# Patient Record
Sex: Female | Born: 1965 | Race: Black or African American | Hispanic: No | Marital: Married | Smoking: Never smoker
Health system: Southern US, Community
[De-identification: ages and names within clinical notes are randomized; demographics above are authoritative.]

## PROBLEM LIST (undated history)

## (undated) DIAGNOSIS — E328 Other diseases of thymus: Secondary | ICD-10-CM

## (undated) DIAGNOSIS — M1990 Unspecified osteoarthritis, unspecified site: Secondary | ICD-10-CM

## (undated) DIAGNOSIS — R7989 Other specified abnormal findings of blood chemistry: Secondary | ICD-10-CM

## (undated) DIAGNOSIS — D573 Sickle-cell trait: Secondary | ICD-10-CM

## (undated) DIAGNOSIS — E1169 Type 2 diabetes mellitus with other specified complication: Secondary | ICD-10-CM

## (undated) DIAGNOSIS — E119 Type 2 diabetes mellitus without complications: Secondary | ICD-10-CM

## (undated) DIAGNOSIS — I10 Essential (primary) hypertension: Secondary | ICD-10-CM

## (undated) DIAGNOSIS — K219 Gastro-esophageal reflux disease without esophagitis: Secondary | ICD-10-CM

## (undated) HISTORY — DX: Other diseases of thymus: E32.8

## (undated) HISTORY — PX: COLONOSCOPY: SHX174

## (undated) HISTORY — DX: Gastro-esophageal reflux disease without esophagitis: K21.9

## (undated) HISTORY — DX: Hyperlipidemia, unspecified: E11.69

## (undated) HISTORY — DX: Essential (primary) hypertension: I10

## (undated) HISTORY — DX: Sickle-cell trait: D57.3

## (undated) HISTORY — DX: Other specified abnormal findings of blood chemistry: R79.89

## (undated) HISTORY — DX: Unspecified osteoarthritis, unspecified site: M19.90

## (undated) HISTORY — PX: CARDIAC CATHETERIZATION: SHX172

---

## 2006-08-23 DIAGNOSIS — M1990 Unspecified osteoarthritis, unspecified site: Secondary | ICD-10-CM

## 2006-08-23 HISTORY — DX: Unspecified osteoarthritis, unspecified site: M19.90

## 2007-05-21 ENCOUNTER — Inpatient Hospital Stay (HOSPITAL_COMMUNITY): Admission: EM | Admit: 2007-05-21 | Discharge: 2007-05-23 | Payer: Self-pay | Admitting: Emergency Medicine

## 2007-05-21 ENCOUNTER — Encounter (INDEPENDENT_AMBULATORY_CARE_PROVIDER_SITE_OTHER): Payer: Self-pay | Admitting: *Deleted

## 2007-05-23 ENCOUNTER — Ambulatory Visit: Payer: Self-pay | Admitting: Thoracic Surgery

## 2007-06-07 ENCOUNTER — Ambulatory Visit: Payer: Self-pay | Admitting: Thoracic Surgery

## 2007-06-09 ENCOUNTER — Ambulatory Visit (HOSPITAL_COMMUNITY): Admission: RE | Admit: 2007-06-09 | Discharge: 2007-06-09 | Payer: Self-pay | Admitting: Thoracic Surgery

## 2007-06-13 ENCOUNTER — Ambulatory Visit: Payer: Self-pay | Admitting: Thoracic Surgery

## 2007-12-06 ENCOUNTER — Ambulatory Visit: Payer: Self-pay | Admitting: Thoracic Surgery

## 2007-12-06 ENCOUNTER — Encounter: Admission: RE | Admit: 2007-12-06 | Discharge: 2007-12-06 | Payer: Self-pay | Admitting: Thoracic Surgery

## 2008-06-05 ENCOUNTER — Encounter: Admission: RE | Admit: 2008-06-05 | Discharge: 2008-06-05 | Payer: Self-pay | Admitting: Thoracic Surgery

## 2008-06-05 ENCOUNTER — Ambulatory Visit: Payer: Self-pay | Admitting: Thoracic Surgery

## 2009-04-23 ENCOUNTER — Ambulatory Visit: Payer: Self-pay | Admitting: Thoracic Surgery

## 2009-04-23 ENCOUNTER — Encounter: Admission: RE | Admit: 2009-04-23 | Discharge: 2009-04-23 | Payer: Self-pay | Admitting: Thoracic Surgery

## 2010-02-19 ENCOUNTER — Ambulatory Visit (HOSPITAL_BASED_OUTPATIENT_CLINIC_OR_DEPARTMENT_OTHER): Admission: RE | Admit: 2010-02-19 | Discharge: 2010-02-19 | Payer: Self-pay | Admitting: Internal Medicine

## 2010-02-19 ENCOUNTER — Ambulatory Visit: Payer: Self-pay | Admitting: Diagnostic Radiology

## 2010-04-13 ENCOUNTER — Ambulatory Visit: Payer: Self-pay | Admitting: Diagnostic Radiology

## 2010-04-13 ENCOUNTER — Ambulatory Visit (HOSPITAL_BASED_OUTPATIENT_CLINIC_OR_DEPARTMENT_OTHER): Admission: RE | Admit: 2010-04-13 | Discharge: 2010-04-13 | Payer: Self-pay | Admitting: Internal Medicine

## 2010-05-26 ENCOUNTER — Ambulatory Visit: Payer: Self-pay | Admitting: Thoracic Surgery

## 2010-05-26 ENCOUNTER — Encounter: Admission: RE | Admit: 2010-05-26 | Discharge: 2010-05-26 | Payer: Self-pay | Admitting: Thoracic Surgery

## 2011-01-05 NOTE — Discharge Summary (Signed)
[name], [name]            ACCOUNT NO.:  [id number]   MEDICAL RECORD NO.:  [id number]          PATIENT TYPE:  INP   LOCATION:  2038                         FACILITY:  [facility]   PHYSICIAN:  Dani Gobble, MD       DATE OF BIRTH:  1965/10/15   DATE OF ADMISSION:  05/21/2007  DATE OF DISCHARGE:  05/23/2007                               DISCHARGE SUMMARY   DISCHARGE DIAGNOSES:  1. Chest pain, possible secondary to gastrointestinal source.      a.     Negative myocardial infarction, though troponin was slightly       elevated.      b.     Patent coronary arteries.  2. Sickle cell trait.      a.     Negative D-dimer.      b.     Negative pulmonary emboli per computerized tomography scan.  3. Migraine headaches.  4. Murmur.  5. Hyperglycemia with a elevated glycohemoglobin.  6. 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 Karle Plumber.  7. Hypertension.  8. Left ventricular hypertrophy.  9. Hyperlipidemia.  10.History of migraine headaches.  11.Cough, secondary to ACE inhibitor.  12.Abnormal thyroid-stimulating hormone.   DISCHARGE CONDITION:  Improved.   PROCEDURES:  Combined left heart catheter May 22, 2007 by Dr.  Susa Griffins.   DISCHARGE MEDICATIONS:  1. Metoprolol 50 mg ER twice a day.  2. Prilosec 20 mg twice a day.  3. Simvastatin 20 mg every evening.  4. Enteric coated aspirin 81 mg daily.  5. Diovan 80 with HCTZ 25 mg daily.  6. Zomig as needed for headaches.  7. Estradiol daily __________ .  8. Allegra 180 mg as needed daily.   DISCHARGE INSTRUCTIONS:  1. May return to work May 29, 2007.  2. Low sodium, heart healthy low carb diet.  3. Increase activity slowly.  4. May shower, bathe.  5. No lifting for 2 days.  6. No driving for 2 days.  7. Wash catheter site with soap and water.  Call if any bleeding,      swelling or drainage.  8. Follow up with Dr. Kandis Cocking office.  Will call date  and time.  9. Follow up with Dr. Edwyna Shell.  His appointment should call you for      that appointment and the phone number was given.  10.Follow up with your primary care physician in [facility] for      possible diabetes.  11.Stop taking lisinopril HCTZ.  This may be the source of your cough.  12.Stop taking Prednisone.   HISTORY OF PRESENT ILLNESS:  A 45 year old black female with a past  medical history of hypertension, migraine headaches who has been having  intermittent chest pain which has been progressive, increasing in  frequency and severity as she had a 2 hour episode the day prior to  admission, and was not helped with Pepto Bismol.  It was described as  mid substernal chest pain.  No radiation, no diaphoresis.  No shortness  of breath, nausea, vomiting.  Recurred  on May 21, 2007 and was  worse, 8 out of 10 with shortness of breath and diaphoresis.  Improved  with aspirin by EMS and 2 sublingual nitroglycerin.  In the ER, she was  pain-free.  She also describes decreased activity tolerance.  She has  had some palpitations.  She had a heart catheter in 1997 in [place] that  was stable.   PAST MEDICAL HISTORY:  As stated above.  She also is positive for sickle  cell trait.   ALLERGIES:  No known allergies.   OUTPATIENT MEDICATIONS:  1. Allegra 180 mg daily.  2. She had 2 days of Prednisone.  3. Metoprolol 50 mg b.i.d.  4. Estradiol 20 mg for migraine p.r.n.  5. Lisinopril HCTZ 20/25 daily.   SOCIAL HISTORY:  Married, 3 children.  No tobacco, alcohol or illicit  drugs.  She works at housekeeping at [employer].   FAMILY HISTORY:  Diabetes and hypertension.   REVIEW OF SYSTEMS:  See H&P.   PHYSICAL EXAMINATION AT DISCHARGE:  VITAL SIGNS:  Blood pressure 115/74,  pulse 56, respiratory 18, temperature 97, oxygen saturation 98%.  GENERAL:  She had no complaints.  LUNGS:  Clear to auscultation bilaterally.  HEART:  Regular rate and rhythm with a 2/6 systolic ejection  murmur.   LABORATORY DATA:  On admission, hemoglobin 13.6, hematocrit 39.8, WBC  10.2, platelets 153,000, neutrophils were slightly up at 82, secondary  to Prednisone.  At discharge, hemoglobin 12.7, hematocrit 37.4, WBC 6.7,  platelets 125,000 and that was on a heparin infusion.   Chemistry on admission:  Sodium 136, potassium 3.1, chloride 102, CO2  25, BUN 14, creatinine 0.96, glucose 192.  Glucose had improved, range  120 and 108.  Potassium prior to discharge had been 3.3.  She was given  40 potassium for repletion and her creatinine was 1.13 with a BUN of 18.  On heparin infusion, she was therapeutic.  AST 22, ALT 22, alkaline phos  32, total bilirubin 0.7, albumin 3.7.   Cardiac enzymes:  CK was 149, MB 2.1 and 121 CK with 1.6 __________  negative.  Troponin 0.11, 0.8 and 0.17.  Calcium 8.9 to 8.3.  Magnesium  2.1.  D-dimer was less than 0.22.  TSH was 1.648, glycohemoglobin was  elevated at 6.6 and TSH was 0.30.   BNP on admission was 40.   Chest x-ray on admission:  No active cardiopulmonary disease.  Lungs  were clear.  CT angio to rule out PE was negative for PE but there was a  solid mass-like structure in anterior mediastinum located just anterior  to the ascending aorta and superior aspect to the main pulmonary artery  segment.  Oval in configuration, 2.0 x 3.0 x 2.7.  It is in the region  of the thymus gland.  Consideration for a thymoma lymphoma.  The  radiologist did not feel it was the appearance for residual thymic  tissue.  Thoracic surgeon consult was obtained.   Two-dimensional echo:  LV was in systolic left ventricular mid cavity  obliteration.  There was mild aortic valve regurgitation.  Right  ventricular systolic function was normal.  Possible tiny posterior  pericardial effusion.  The left ventricle was small overall.  Left  ventricular systolic function was hyperdynamic.  No regional wall  abnormalities.  Also in the echo, mild mitral valve  regurgitation.   On the cardiac catheterization, there was no evidence of obstructive  disease at the outflow tract at rest as possibly described on the  2-D echo.  She has systemic hypertension with normal renal arteries and  patent coronary arteries.  EF was 60% and no mitral regurgitation was  present as well.   HOSPITAL COURSE:  The patient was admitted by Dr. Domingo Sep for a 45-year-  old female with chest pain.  She had mildly abnormal troponins, though  once she was admitted, had no further chest pain.  She was put on IV  nitroglycerin and heparin.  Steroids were discontinued that she had been  on.  Dr. Domingo Sep felt her cough was related to her ACE inhibitor and  this was discontinued at discharge and she was switched to a __________  ARB.  The cough may linger for another month before it is totally  resolved if it is indeed, from ACE inhibitor.  The patient was kept in  the transitional care unit.  By the next morning, we realized as well  the patient stated she had sickle cell trait.  She has 2 children with  sickle cell disease.  D-dimer was done which was negative and she went  on to her cardiac catheter which showed normal __________ .  Because of  the normal __________ , the shortness of breath as well as sickle cell  trait, we did a CT of her chest which was negative for pulmonary emboli  and this was done May 23, 2007.   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  Dr. Karle Plumber was consulted.  He felt following the asymptomatic thymus tumor,  if that is indeed what it was, was prudent.  He seemed to not feel it  was the cause of her chest pain.  It could be a benign thymoma, thymic  or hyperplasia lymph node enlargement or a teratoma.  He felt that she  was stable to go home.  He will see her back as an outpatient where he  may get a PET scan as well.  Also during the hospitalization, her  glycohemoglobin was elevated at 6.6.  Dietician should  speak to her  prior to her discharge and she will be on a low carb diet until she can  follow up with Dr. Tresa Endo at [facility].  She also will need followup on  her possible hypothyroidism.  We actually ordered a hypothyroid panel  but they just did another TSH instead so we did not want to order it  after her cardiac dye; she needed to wait a week or so, so she will  follow up with her primary care physician concerning that.  We will add  a PPI to see if that is the cause of her pain, twice a  day.  Her lipids were up so we are adding simvastatin 20 mg and we  changed her lisinopril HCT to Diovan HCT.  She will also follow up with  Dr. Alanda Amass just to ensure she is stable and following up with Dr.  Edwyna Shell.  Dr. Clarene Duke saw her on May 23, 2007.  The patient  understands about her mass and her medications.           ______________________________  Dani Gobble, MD     AB/MEDQ  D:  05/23/2007  T:  05/24/2007  Job:  [id number]   cc:   Dani Gobble, MD  Richard A. Alanda Amass, M.D.  Ines Bloomer, M.D.  Almedia Balls

## 2011-01-05 NOTE — Letter (Signed)
June 13, 2007   Richard A. Alanda Amass, M.D.  [phone] [address]  [place], [place] [address]   Re:  [name], [name]              DOB:  [birth year]   Dear Luan Pulling:   I saw the patient back for followup today.  As you know, she was  admitted for chest pain, and she was found to have a 2-cm thymic mass.  We got a PET scan on this today, and it showed no increased activity in  the mass.  This is probably a benign lymph node or a very slow-growing  thymoma.  Whatever the case is, we can follow this safely, so I have  schedule her to see me again in 3 months with a CT scan.  I informed her  and her husband of this and they were happy of the findings.  Her blood  pressure was 137/90, pulse 62, respirations 18, sats were 98%.   Ines Bloomer, M.D.  Electronically Signed   DPB/MEDQ  D:  06/13/2007  T:  06/14/2007  Job:  [id number]   cc:   Almedia Balls

## 2011-01-05 NOTE — Cardiovascular Report (Signed)
NAMEBRILYNN, [name]            ACCOUNT NO.:  [id number]   MEDICAL RECORD NO.:  [id number]          PATIENT TYPE:  INP   LOCATION:  2921                         FACILITY:  [facility]   PHYSICIAN:  Richard A. Alanda Amass, M.D.DATE OF BIRTH:  1966/04/22   DATE OF PROCEDURE:  DATE OF DISCHARGE:                            CARDIAC CATHETERIZATION   PROCEDURE:  Retrograde central aortic catheterization, selective  coronary angiography by Judkins technique, LV angiogram in RAO/LAO  projection, induced PVCs with simultaneous LV FA pressure measurements,  abdominal aortic angiogram mid stream PA  projection, right common  femoral artery closure with StarClose device successful.   PROCEDURE:  The patient was brought to second floor CP lab in  postabsorptive state after 5 mg of Valium p.o. premedication.  Heparin  was on hold.  She was premedicated with 5 mg of Valium and given 2 mg of  Versed IV for sedation in the lab; 1% Xylocaine was used for local  anesthesia,  and the RCFA was entered with a single anterior puncture  using 18 thin-wall needle.  A 6-French short side arm sheath was  inserted without difficulty.  Diagnostic coronary angiography was done  with 6-French 4 cm tapered Cordis  preformed coronary catheters and a 5-  French pigtail catheter for LV angiogram in the RAO and LAO projection  at 25 ml/14 ml per second, 20 ml/12 ml per second.  Pullback pressures  of the CA showed no gradient across the aortic valve.   There was no post PVC gradient and no Brockenbrough sign on induced PVCs  with simultaneous LV NFA pressure measurement.  Pigtail catheter was  pulled down above the level of the renal arteries and abdominal aortic  angiogram was done because of the patient's hypertension to rule out  renal disease, particularly FMD at this age.  This was done at 25 ml/20  ml per second and demonstrated dual left renal arteries that were widely  patent and single right renal artery that was  widely patent.  There was  no evidence of FMD or renal artery stenosis.  Catheter was removed;  right femoral angiogram was done by hand injection in light projection  showing good puncture into the RCFA.  The arteriotomy was closed because  of the patient's obesity and cough with a 6-French StarClose device  successfully.  She was transferred to the holding area for postoperative  care in stable condition and she tolerated the procedure well.   PRESSURES:  LV:  160/0; LVEDP 18-20 mmHg.   CA:  160/90 mmHg.  There was no gradient across the aortic valve on  catheter pullback and no post PVC gradient or Brockenbrough  sign as  outlined above.   Fluoroscopy did not reveal any coronary, intracardiac or valvular  calcification.   LV angiogram in the RAO and LAO projection showed a normally contracting  left ventricle with no segment wall motion abnormality; EF approximately  60%; there was no mitral regurgitation present.  There appeared to be  concentric LVH on LV angiogram.   The main left coronary artery was large and normal.   The LAD was widely patent  and somewhat tortuous, but smooth and normal  throughout its course and coursed through the undersurface of the heart  around the apex.  There was a large DX-1 that was normal before SP-1 and  a large DX-2 with the junction of the proximal third of the LAD that was  normal.   The circumflex was moderate to large with two large marginal branches in  the distal AV groove branch.  It was widely patent and normal and  nondominant.   The right coronary artery was a widely patent, normal and dominant  vessel with normal PDA and PLA.   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  She is G:4 P:3 AB:1 (one  miscarriage) and she and her husband have sickle cell trait.  She has  two children with sickle cell disease.  She has recent urinary tract  infection-type illness  apparently treated by Dr. Tresa Endo in [facility]  with decongestant and a course of steroids.  There is a history of  hypertension, migraine headaches and intermittent chest pain.  She has  had no recent accidents and no history of deep vein thrombosis or leg  discomfort.  She was admitted to the hospital by Dr. Domingo Sep with  episodic intermittent chest pain that had been progressive over several  days to a week.  This was nonpleuritic and substernal.  She had no  change with antacids, but she did have a two-hour episode the night  prior to admission.  CPK-MBs were negative, troponin was mildly  elevated, LDL was elevated to 140 with cholesterol of 209.  She has  exogenous obesity and apparent known history of hypertension.  There  were no diagnostic EKG changes  and patient was started on heparin, beta-  blocker and aspirin and statin therapy pending diagnostic  catheterization.   At this dictation, D-dimer is pending.  Diagnostic cath shows normal  coronary arteries and left ventricle.  Of note, 2-D echo done by Dr.  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  There was apparently a left  ventricular outflow track obstruction noted, but no diagnostic speckled  pattern, and I am not sure of ventricular or septal dimensions at this  dictation.  There was no gradient at rest and negative Brockenbrough  sign in the cath lab.  This patient may have an component of  hypertrophic obstructive cardiomyopathy which was not evident at  catheterization; we did not try to precipitate this with Isuprel  infusion at this study.   Concerned about elevated troponin with her chest pain history and recent  cough and we plan to do a chest CT to rule out pulmonary embolus.  We  will resume heparin until this is done in the morning pending followup  renal function.  We will start her on antibiotics because of her cough  with presumed bronchitis.   Recommend treatment of her hypertension and  she may need followup 2-D echo.  Treatment might well include beta  blockers and/or calcium blockers such as verapamil in this setting.   CATHETERIZATION DIAGNOSIS:  1. Chest pain, etiology not determined.  2. Possible hypertrophic cardiomyopathy on recent 2-D echo; no      evidence of obstructive disease at outflow track at rest on this      study as outlined above.  3. Systemic hypertension; normal renal arteries.  4. Hyperlipidemia.  5. Exogenous obesity.  6. Sickle cell  trait (two children with sickle cell disease).  7. Recent urinary tract infection and cough.  Pulmonary embolus being      ruled out with known SS trait.      Richard A. Alanda Amass, M.D.  Electronically Signed     RAW/MEDQ  D:  05/22/2007  T:  05/22/2007  Job:  [id number]   cc:   CP Lab  Dani Gobble, MD  Almedia Balls

## 2011-01-05 NOTE — Assessment & Plan Note (Signed)
OFFICE VISIT   VALENTINA, ALCOSER  DOB:  1966/04/03                                        June 07, 2007  CHART #:  [id number]   Ms. Kaylei Frink came today for followup of her thymic mass.  Her  blood pressure was 126/86.  Pulse 60.  Respirations 18.  Sats were 100.  Chest x-ray was stable.  She has had no more chest pain.  Because of the  thymic mass, I reviewed it again and I decided to go ahead and order her  a PET scan.  I will see her back again after the PET scan.  I am worried  that she may have a thymoma or lymphoma.   Ines Bloomer, M.D.  Electronically Signed   DPB/MEDQ  D:  06/07/2007  T:  06/08/2007  Job:  [id number]

## 2011-01-05 NOTE — Letter (Signed)
May 26, 2010   Richard A. Alanda Amass, MD  [address]  [place], [place] [address]   Re:  ENZLEY, KITCHENS              DOB:  08/24/1965   Dear Luan Pulling:   I saw the patient back today for followup of her anterior mediastinal  mass.  Her blood pressure was 137/94, pulse 54, respirations 16, and  sats were 98%.  Overall, she is doing well and I will see her back again  in 1 year with another CT scan.  The CT scan today showed that was  unchanged at 3 x 2 cm.   Ines Bloomer, M.D.  Electronically Signed   DPB/MEDQ  D:  05/26/2010  T:  05/26/2010  Job:  [id number]

## 2011-01-05 NOTE — Letter (Signed)
December 06, 2007   Susa Griffins, MD  [phone] [address]  [place], [place] [address]   Re:  MILEYDI, MILSAP              DOB:  01-03-1966   Dear Luan Pulling:   I saw Ms. Knudtson for follow up today.  She is doing well.  We repeated  her CT scan of her anterior mediastinum, and it was okay at 3.7 x 1.8.  If there is no other change, we will just continue to follow this and I  will see her back again in six months with the CT scan.   Blood pressure is 160/90, pulse of 58, respirations 18, sats are 98%.   Ines Bloomer, M.D.  Electronically Signed   DPB/MEDQ  D:  12/06/2007  T:  12/06/2007  Job:  [id number]

## 2011-01-05 NOTE — Consult Note (Signed)
[name], [name]            ACCOUNT NO.:  [id number]   MEDICAL RECORD NO.:  [id number]          PATIENT TYPE:  INP   LOCATION:  2038                         FACILITY:  [facility]   PHYSICIAN:  [name], M.D. DATE OF BIRTH:  [birth year]   DATE OF CONSULTATION:  DATE OF DISCHARGE:                                 CONSULTATION   HISTORY OF PRESENT ILLNESS:  This 45 year old African-American female  has a past medical history of hypertension, migraine headaches, and  intermittent chest pain.  She had a catheterization that was okay in  1997.  She was admitted with increasing intermittent chest pain that was  unrelieved with Pepto-Bismol, with diaphoresis, no shortness of breath.  It apparently got better with aspirin and 2 sublingual nitroglycerins.  She was admitted on May 21, 2007 and underwent a workup.  Her  enzymes were negative.  She was underwent a catheterization, which was  normal.  A CT scan was done for pulmonary embolus which was negative,  but revealed a 2 x 3 cm lesion in the thymus gland that was read by the  radiologist as being a possible thymoma.  It also could be a hypoplasia,  lymphadenopathy, I doubt a teratoma.  She has had no weight loss.  She  has had some blurred visions, but no other symptoms of myasthenia  gravis.   PAST MEDICAL HISTORY:  She has no allergies.   MEDICATIONS:  1. Phenazodine 180 mg a day.  2. Prednisone.  She had been on prednisone 40 mg daily for 2 days.  3. Metoprolol 50 mg at night.  4. Estradiol 20 mg p.r.n. for headaches.  5. Lisinopril and hydrochlorothiazide 20/25 mg daily.   FAMILY HISTORY:  Noncontributory.  Negative for diabetes and  hypertension.   SOCIAL HISTORY:  She is married.  She has 3 children.  She does not  smoke, occasional alcohol.  She works in housekeeping.   REVIEW OF SYSTEMS:  CARDIAC:  See history of present illness.  PULMONARY:  She had a dry cough.  No hemoptysis, fever or chills.  GI:  No  nausea, vomiting, constipation or diarrhea.  GU:  No dysuria,  frequent urination or kidney disease.  VASCULAR:  No claudication, DVT  or TIAs.  NEUROLOGICAL:  She does have headaches.  See history of  present illness.  No seizures or blackouts.  MUSCULOSKELETAL:  No joint  pain.  The chest pain could be muscular in nature.  No skin lesions.  HEMATOLOGICAL:  No problems with bleeding or clotting disorders.   PHYSICAL EXAMINATION:  GENERAL:  She is a well-developed, slightly obese  African-American female in no acute distress.  VITAL SIGNS:  Blood pressure 120/85, pulse 60, saturations were 98%.  She is afebrile.  HEENT:  Unremarkable.  NECK:  Supple without thyromegaly.  There is no supraclavicular or axial  adenopathy.  CHEST:  Clear to auscultation and percussion.  HEART:  Regular sinus rhythm.  I/VI murmur left sternal border.  ABDOMEN:  Soft.  There is no hepatosplenomegaly.  EXTREMITIES:  Pulses are 1+.  No clubbing or edema.  NEUROLOGICAL:  She is oriented  x3.  Sensory and motor are intact.   IMPRESSION:  1. 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  Rule out thymic hyperplasia, rule      out lymphadenopathy. Possible lymphoma.  I doubt teratoma.  2. History of chest pain.  3. Hypertension.  4. History of migraine headaches.   FOLLOW UP:  Follow up in office.  I recommend biopsy with surgery at the  present time.  May consider PET scan as an outpatient.  I have discussed  situation with patient and family.      [name], M.D.  Electronically Signed     DPB/MEDQ  D:  05/23/2007  T:  05/23/2007  Job:  [id number]

## 2011-01-05 NOTE — Letter (Signed)
June 05, 2008   Richard A. Alanda Amass, MD  [address]  [place], [place] [address]   Re:  JERILEE, SPACE              DOB:  04/26/1966   Dear Gerlene Burdock:   I saw the patient back today for followup of her anterior mediastinal  mass.  Really, there has been no change since the CT scan 6 months ago.  It was 3.2 x 2 cm.  They measured 3.2 x 1.8 cm and there is no change in  size since this was negative on her PET.  I think we can continue to  just follow this with intermittent CTs.  I am now going to see her again  in 9 months with her next CT scan.  Her blood pressure was 134/89, pulse  55, respirations 18, and sats were 96%.  Lungs were clear to  auscultation and percussion.   Ines Bloomer, M.D.  Electronically Signed   DPB/MEDQ  D:  06/05/2008  T:  06/05/2008  Job:  [id number]

## 2011-01-05 NOTE — Letter (Signed)
April 23, 2009   Richard A. Alanda Amass, MD  [address]  [place], [place] [address]   Re:  [name], [name]              DOB:  1966/03/07   Dear Luan Pulling,   I saw the patient back in the office today for a followup of her  anterior mediastinal mass, this is a 60-month CT, and it shows no  evidence of any change in the mass.  It has not shrunk or gotten any  larger.  Since it is of fairly good size being 3 x 2 cm, I will continue  to follow this and will stretch the CT scan out to a year.  We will see  her back again in a year to determine if any further therapy needs to be  done.   I  appreciate the opportunity of seeing the patient.   Sincerely,   Ines Bloomer, M.D.  Electronically Signed   DPB/MEDQ  D:  04/23/2009  T:  04/24/2009  Job:  [id number]

## 2011-05-14 ENCOUNTER — Other Ambulatory Visit: Payer: Self-pay | Admitting: Thoracic Surgery

## 2011-05-14 DIAGNOSIS — R222 Localized swelling, mass and lump, trunk: Secondary | ICD-10-CM

## 2011-06-03 LAB — MAGNESIUM: Magnesium: 2.1

## 2011-06-03 LAB — DIFFERENTIAL
Basophils Absolute: 0
Basophils Relative: 0
Eosinophils Absolute: 0
Eosinophils Relative: 0
Lymphocytes Relative: 14
Lymphs Abs: 1.4
Monocytes Absolute: 0.5
Monocytes Relative: 5
Neutro Abs: 8.3 — ABNORMAL HIGH
Neutrophils Relative %: 82 — ABNORMAL HIGH

## 2011-06-03 LAB — HEPARIN LEVEL (UNFRACTIONATED)
Heparin Unfractionated: 0.23 — ABNORMAL LOW
Heparin Unfractionated: 0.5
Heparin Unfractionated: 0.82 — ABNORMAL HIGH
Heparin Unfractionated: 1.09 — ABNORMAL HIGH

## 2011-06-03 LAB — CK TOTAL AND CKMB (NOT AT ARMC)
CK, MB: 3
CK, MB: 3.5
Relative Index: 1.9
Relative Index: 2.3
Total CK: 155
Total CK: 156

## 2011-06-03 LAB — CBC
HCT: 39.8
Hemoglobin: 13.3
Hemoglobin: 13.6
MCHC: 34.3
MCV: 89.6
Platelets: 125 — ABNORMAL LOW
Platelets: 153
RBC: 4.44
RDW: 12.5
RDW: 12.6
RDW: 13.1
WBC: 10.2
WBC: 6.7

## 2011-06-03 LAB — COMPREHENSIVE METABOLIC PANEL WITH GFR
ALT: 22
CO2: 26
Calcium: 8.9
Creatinine, Ser: 0.91
GFR calc non Af Amer: >60
Glucose, Bld: 120 — ABNORMAL HIGH
Sodium: 141
Total Protein: 7.3

## 2011-06-03 LAB — APTT: aPTT: 25

## 2011-06-03 LAB — COMPREHENSIVE METABOLIC PANEL
AST: 22
Albumin: 3.4 — ABNORMAL LOW
Alkaline Phosphatase: 32 — ABNORMAL LOW
BUN: 12
Chloride: 108
GFR calc Af Amer: >60
Potassium: 3.5
Total Bilirubin: 0.7

## 2011-06-03 LAB — BASIC METABOLIC PANEL WITH GFR
BUN: 14
CO2: 25
Calcium: 8.9
Creatinine, Ser: 0.96
GFR calc non Af Amer: >60
Glucose, Bld: 192 — ABNORMAL HIGH

## 2011-06-03 LAB — TSH
TSH: 0.3 — ABNORMAL LOW
TSH: 1.648

## 2011-06-03 LAB — BASIC METABOLIC PANEL
BUN: 18
Calcium: 8.3 — ABNORMAL LOW
Chloride: 102
Creatinine, Ser: 1.13
GFR calc Af Amer: >60
GFR calc non Af Amer: 53 — ABNORMAL LOW
Potassium: 3.1 — ABNORMAL LOW
Potassium: 3.3 — ABNORMAL LOW
Sodium: 136

## 2011-06-03 LAB — POCT CARDIAC MARKERS
CKMB, poc: 2.3
Myoglobin, poc: 76.5
Operator id: 151321
Troponin i, poc: 0.06 — ABNORMAL HIGH

## 2011-06-03 LAB — D-DIMER, QUANTITATIVE: D-Dimer, Quant: <0.22

## 2011-06-03 LAB — HEMOGLOBIN A1C
Hgb A1c MFr Bld: 6.6 — ABNORMAL HIGH
Mean Plasma Glucose: 158

## 2011-06-03 LAB — LIPID PANEL
Cholesterol: 218 — ABNORMAL HIGH
HDL: 47
Total CHOL/HDL Ratio: 4.6

## 2011-06-03 LAB — CARDIAC PANEL(CRET KIN+CKTOT+MB+TROPI)
CK, MB: 1.6
Relative Index: 1.3
Troponin I: 0.08 — ABNORMAL HIGH
Troponin I: 0.11 — ABNORMAL HIGH

## 2011-06-03 LAB — TROPONIN I
Troponin I: 0.1 — ABNORMAL HIGH
Troponin I: 0.13 — ABNORMAL HIGH
Troponin I: 0.17 — ABNORMAL HIGH

## 2011-06-03 LAB — PROTIME-INR
INR: 1.1
Prothrombin Time: 13.9

## 2011-06-03 LAB — B-NATRIURETIC PEPTIDE (CONVERTED LAB): Pro B Natriuretic peptide (BNP): 40

## 2011-06-14 ENCOUNTER — Encounter: Payer: Self-pay | Admitting: Thoracic Surgery

## 2011-06-14 DIAGNOSIS — I10 Essential (primary) hypertension: Secondary | ICD-10-CM | POA: Insufficient documentation

## 2011-06-14 DIAGNOSIS — G43909 Migraine, unspecified, not intractable, without status migrainosus: Secondary | ICD-10-CM | POA: Insufficient documentation

## 2011-06-16 ENCOUNTER — Ambulatory Visit
Admission: RE | Admit: 2011-06-16 | Discharge: 2011-06-16 | Disposition: A | Discharge status: Home / Self Care | Payer: Federal, State, Local not specified - PPO | Source: Ambulatory Visit | Attending: Thoracic Surgery | Admitting: Thoracic Surgery

## 2011-06-16 ENCOUNTER — Encounter: Payer: Self-pay | Admitting: Thoracic Surgery

## 2011-06-16 ENCOUNTER — Ambulatory Visit (INDEPENDENT_AMBULATORY_CARE_PROVIDER_SITE_OTHER): Payer: Federal, State, Local not specified - PPO | Admitting: Thoracic Surgery

## 2011-06-16 VITALS — BP 130/72 | HR 70 | Resp 16 | Ht 62.0 in | Wt 182.0 lb

## 2011-06-16 DIAGNOSIS — J984 Other disorders of lung: Secondary | ICD-10-CM

## 2011-06-16 DIAGNOSIS — R911 Solitary pulmonary nodule: Secondary | ICD-10-CM

## 2011-06-16 DIAGNOSIS — R222 Localized swelling, mass and lump, trunk: Secondary | ICD-10-CM

## 2011-06-16 NOTE — Progress Notes (Signed)
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 This is unchanged since 2008. I feel he needs to be follow these one more year. See her back again with a CT scan in one year. She was informed that my partners will see her at that time. If there is any change since she will probably will require a thymectomy.  Current Outpatient Prescriptions  Medication Sig Dispense Refill  . aspirin 81 MG tablet Take 81 mg by mouth daily.        . Calcium Carbonate-Vitamin D (CALCIUM-VITAMIN D) 500-200 MG-UNIT per tablet Take 1 tablet by mouth 2 (two) times daily with a meal.        . diclofenac (CATAFLAM) 50 MG tablet Take 50 mg by mouth 3 (three) times daily.        . metoprolol (TOPROL-XL) 50 MG 24 hr tablet Take 50 mg by mouth daily.        Marland Kitchen omega-3 acid ethyl esters (LOVAZA) 1 G capsule Take 2 g by mouth 2 (two) times daily.        Marland Kitchen omeprazole (PRILOSEC) 20 MG capsule Take 20 mg by mouth daily.        Marland Kitchen etodolac (LODINE) 500 MG tablet Take 500 mg by mouth 2 (two) times daily.        Marland Kitchen HYDROcodone-acetaminophen (NORCO) 10-325 MG per tablet Take 1 tablet by mouth every 6 (six) hours as needed.        . SUMAtriptan (IMITREX) 50 MG tablet Take 50 mg by mouth every 2 (two) hours as needed.           Review of Systems: Unchanged   Physical Exam  Cardiovascular: Normal rate and regular rhythm.   Pulmonary/Chest: Breath sounds normal. No respiratory distress.     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   Impression: Thymic mass  Plan: Return in one year with a CT scan

## 2011-06-17 ENCOUNTER — Other Ambulatory Visit (HOSPITAL_COMMUNITY)
Admission: RE | Admit: 2011-06-17 | Discharge: 2011-06-17 | Disposition: A | Discharge status: Home / Self Care | Payer: Federal, State, Local not specified - PPO | Source: Ambulatory Visit | Attending: Obstetrics and Gynecology | Admitting: Obstetrics and Gynecology

## 2011-06-17 ENCOUNTER — Other Ambulatory Visit: Payer: Self-pay | Admitting: Obstetrics and Gynecology

## 2011-06-17 DIAGNOSIS — Z124 Encounter for screening for malignant neoplasm of cervix: Secondary | ICD-10-CM | POA: Insufficient documentation

## 2011-06-17 DIAGNOSIS — Z1159 Encounter for screening for other viral diseases: Secondary | ICD-10-CM | POA: Insufficient documentation

## 2011-06-22 ENCOUNTER — Other Ambulatory Visit: Payer: Self-pay

## 2011-06-22 ENCOUNTER — Ambulatory Visit: Payer: Self-pay | Admitting: Thoracic Surgery

## 2012-03-03 ENCOUNTER — Ambulatory Visit (INDEPENDENT_AMBULATORY_CARE_PROVIDER_SITE_OTHER): Payer: Federal, State, Local not specified - PPO | Admitting: Family Medicine

## 2012-03-03 ENCOUNTER — Encounter: Payer: Self-pay | Admitting: Family Medicine

## 2012-03-03 VITALS — BP 123/78 | HR 54 | Temp 97.9°F | Resp 16 | Ht 62.0 in | Wt 180.0 lb

## 2012-03-03 DIAGNOSIS — E669 Obesity, unspecified: Secondary | ICD-10-CM | POA: Insufficient documentation

## 2012-03-03 DIAGNOSIS — M5136 Other intervertebral disc degeneration, lumbar region: Secondary | ICD-10-CM | POA: Insufficient documentation

## 2012-03-03 DIAGNOSIS — E66811 Obesity, class 1: Secondary | ICD-10-CM

## 2012-03-03 DIAGNOSIS — M5137 Other intervertebral disc degeneration, lumbosacral region: Secondary | ICD-10-CM

## 2012-03-03 DIAGNOSIS — I10 Essential (primary) hypertension: Secondary | ICD-10-CM

## 2012-03-03 DIAGNOSIS — M51369 Other intervertebral disc degeneration, lumbar region without mention of lumbar back pain or lower extremity pain: Secondary | ICD-10-CM

## 2012-03-03 MED ORDER — CELECOXIB 200 MG PO CAPS: 200.0000 mg | ORAL_CAPSULE | Freq: Two times a day (BID) | ORAL | Status: DC

## 2012-03-03 MED ORDER — CELECOXIB 100 MG PO CAPS: 100.0000 mg | ORAL_CAPSULE | Freq: Two times a day (BID) | ORAL | Status: DC

## 2012-03-03 MED ORDER — GABAPENTIN 300 MG PO CAPS: 300.0000 mg | ORAL_CAPSULE | Freq: Three times a day (TID) | ORAL | Status: DC

## 2012-03-03 NOTE — Progress Notes (Signed)
  Subjective:    Patient ID: [name], female    DOB: 10/19/1965, 46 y.o.   MRN: [id number]  HPI   This 46 y.o.  African female has a 5-year hx of low back pain and has been diagnosed with Lumbar DDD.  She has been seen and treated at [facility] (series of injections). Lumbar Laminectomy was  recommended but pt decided not to  proceed with procedure. She has already had PT about 4 years ago. Currently taking Diclofenac without relief. She denies any bladder or bowel problems and denies fever.   she is having nocturnal pain. Taking Diclofenac without relief. Has taken Nabumetone in past -ineffective.   She has HTN, currently on medication without side effects. Denies HA, CP, palpitations, SOB, edema, dizziness.   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 Finding it difficult to perform work.    Review of Systems As per HPI    Objective:   Physical Exam  Nursing note and vitals reviewed. Constitutional: She is oriented to person, place, and time. She appears well-developed and well-nourished.  HENT:  Head: Normocephalic and atraumatic.  Right Ear: External ear normal.  Left Ear: External ear normal.  Eyes: Conjunctivae and EOM are normal. No scleral icterus.  Neck: Normal range of motion. Neck supple. No JVD present.  Cardiovascular: Normal rate and regular rhythm.   Pulmonary/Chest: Effort normal. No respiratory distress.  Abdominal: Soft. She exhibits distension. There is no tenderness.       Has large firm abdomen (girth not measured)  Musculoskeletal:       Thoracic back: She exhibits tenderness and spasm. She exhibits no swelling, no edema, no deformity and no pain.       Lumbar back: She exhibits decreased range of motion, tenderness, deformity, pain and spasm. She exhibits no swelling and no edema.       Lumbar spine: increased lordosis; forward flexion to 60 degrees without difficulty. Cannot bear weight on right leg.  Cannot  walk on toes or heels without support.  Neurological: She is alert and oriented to person, place, and time. No cranial nerve deficit. She exhibits normal muscle tone. Coordination normal.  Skin: Skin is warm and dry.  Psychiatric: She has a normal mood and affect. Her behavior is normal. Thought content normal.          Assessment & Plan:   1. DDD (degenerative disc disease), lumbar - this has been fully evaluated by an Orthopedist; explained to pt and husband that conservative therapy can be continued but unlikely to provide effective relief. If surgery has been recommended, consider second opinion or contact [facility] (look up info online) or inquire if [facility] is familiar with this treatment option and do they offer it.  RX: Celebrex 100 mg  1 capsule bid with meals. RX: Gabapentin 300 mg  Take 1 capsule hs for 3 nights then increase to 2 caps hs. Advised that current work requirements will aggravate back pain. Also, abdominal obesity aggravates Lumbar DDD.    2. HTN (hypertension) - stable on current medication.

## 2012-03-03 NOTE — Patient Instructions (Addendum)
Degenerative Disc Disease Degenerative disc disease is a condition caused by the changes that occur in the cushions of the backbone (spinal discs) as you grow older. Spinal discs are soft and compressible discs located between the bones of the spine (vertebrae). They act like shock absorbers. Degenerative disc disease can affect the wholespine. However, the neck and lower back are most commonly affected. Many changes can occur in the spinal discs with aging, such as:  The spinal discs may dry and shrink.   Small tears may occur in the tough, outer covering of the disc (annulus).   The disc space may become smaller due to loss of water.   Abnormal growths in the bone (spurs) may occur. This can put pressure on the nerve roots exiting the spinal canal, causing pain.   The spinal canal may become narrowed.  CAUSES  Degenerative disc disease is a condition caused by the changes that occur in the spinal discs with aging. The exact cause is not known, but there is a genetic basis for many patients. Degenerative changes can occur due to loss of fluid in the disc. This makes the disc thinner and reduces the space between the backbones. Small cracks can develop in the outer layer of the disc. This can lead to the breakdown of the disc. You are more likely to get degenerative disc disease if you are overweight. Smoking cigarettes and doing heavy work such as weightlifting can also increase your risk of this condition. Degenerative changes can start after a sudden injury. Growth of bone spurs can compress the nerve roots and cause pain.  SYMPTOMS  The symptoms vary from person to person. Some people may have no pain, while others have severe pain. The pain may be so severe that it can limit your activities. The location of the pain depends on the part of your backbone that is affected. You will have neck or arm pain if a disc in the neck area is affected. You will have pain in your back, buttocks, or legs if a  disc in the lower back is affected. The pain becomes worse while bending, reaching up, or with twisting movements. The pain may start gradually and then get worse as time passes. It may also start after a major or minor injury. You may feel numbness or tingling in the arms or legs.  DIAGNOSIS  Your caregiver will ask you about your symptoms and about activities or habits that may cause the pain. He or she may also ask about any injuries, diseases, ortreatments you have had earlier. Your caregiver will examine you to check for the range of movement that is possible in the affected area, to check for strength in your extremities, and to check for sensation in the areas of the arms and legs supplied by different nerve roots. An X-ray of the spine may be taken. Your caregiver may suggest other imaging tests, such as a computerized magnetic scan (MRI), if needed.  TREATMENT  Treatment includes rest, modifying your activities, and applying ice and heat. Your caregiver may prescribe medicines to reduce your pain and may ask you to do some exercises to strengthen your back. In some cases, you may need surgery. You and your caregiver will decide on the treatment that is best for you. HOME CARE INSTRUCTIONS   Follow proper lifting and walking techniques as advised by your caregiver.   Maintain good posture.   Exercise regularly as advised.   Perform relaxation exercises.   Change your sitting,   standing, and sleeping habits as advised. Change positions frequently.   Lose weight as advised.   Stop smoking if you smoke.   Wear supportive footwear.  SEEK MEDICAL CARE IF:  The pain does not go away within 1 to 4 weeks. SEEK IMMEDIATE MEDICAL CARE IF:   The pain is severe.   You notice weakness in your arms, hands, or legs.   You begin to lose control of your bladder or bowel.  MAKE SURE YOU:   Understand these instructions.   Will watch your condition.   Will get help right away if you are not  doing well or get worse.  Document Released: 06/06/2007 Document Revised: 07/29/2011 Document Reviewed: 06/06/2007 [employer] Patient Information 2012 [employer]. 

## 2012-03-26 ENCOUNTER — Emergency Department (HOSPITAL_COMMUNITY)
Admission: EM | Admit: 2012-03-26 | Discharge: 2012-03-26 | Disposition: A | Discharge status: Home / Self Care | Payer: Federal, State, Local not specified - PPO | Attending: Emergency Medicine | Admitting: Emergency Medicine

## 2012-03-26 ENCOUNTER — Emergency Department (HOSPITAL_COMMUNITY): Payer: Federal, State, Local not specified - PPO

## 2012-03-26 ENCOUNTER — Encounter (HOSPITAL_COMMUNITY): Payer: Self-pay | Admitting: *Deleted

## 2012-03-26 DIAGNOSIS — M5137 Other intervertebral disc degeneration, lumbosacral region: Secondary | ICD-10-CM | POA: Insufficient documentation

## 2012-03-26 DIAGNOSIS — S20219A Contusion of unspecified front wall of thorax, initial encounter: Secondary | ICD-10-CM | POA: Insufficient documentation

## 2012-03-26 DIAGNOSIS — IMO0002 Reserved for concepts with insufficient information to code with codable children: Secondary | ICD-10-CM | POA: Insufficient documentation

## 2012-03-26 DIAGNOSIS — M51379 Other intervertebral disc degeneration, lumbosacral region without mention of lumbar back pain or lower extremity pain: Secondary | ICD-10-CM | POA: Insufficient documentation

## 2012-03-26 DIAGNOSIS — I10 Essential (primary) hypertension: Secondary | ICD-10-CM | POA: Insufficient documentation

## 2012-03-26 MED ORDER — IBUPROFEN 600 MG PO TABS: 600.0000 mg | ORAL_TABLET | Freq: Four times a day (QID) | ORAL | Status: AC | PRN

## 2012-03-26 MED ORDER — HYDROMORPHONE HCL PF 1 MG/ML IJ SOLN
1.0000 mg | Freq: Once | INTRAMUSCULAR | Status: AC
  Administered 2012-03-26: 1 mg via INTRAMUSCULAR
  Filled 2012-03-26: qty 1

## 2012-03-26 MED ORDER — OXYCODONE-ACETAMINOPHEN 5-325 MG PO TABS: 1.0000 | ORAL_TABLET | ORAL | Status: AC | PRN

## 2012-03-26 NOTE — ED Provider Notes (Signed)
History     CSN: [id number]  Arrival date & time 03/26/12  1044   First MD Initiated Contact with Patient 03/26/12 1101      Chief Complaint  Patient presents with  . Rib Injury    (Consider location/radiation/quality/duration/timing/severity/associated sxs/prior treatment) HPI Comments: [name] is a 46 y.o. Female who prsents with pain to left ribs. States she was in a car in the passenger seat a week ago, had her arm on an arm rest, and it slipped off hitting left side of the ribs on the arm rest. Pt states pain at that time, but it is worsening. States that she went to her PCP who did an x-ray, but she never heard back from them. States pain now unbearable, not improving with hydrocodone. Pt unable to take a deep breath or cough. Pain with movement and palpation as well.    Past Medical History  Diagnosis Date  . Chest pain   . HTN (hypertension)   . Migraine   . Arthritis 2008    Lumbar Deg Disc Disease    History reviewed. No pertinent past surgical history.  Family History  Problem Relation Age of Onset  . Diabetes Mother   . Hypertension Mother     History  Substance Use Topics  . Smoking status: Never Smoker   . Smokeless tobacco: Not on file  . Alcohol Use: No    OB History    Grav Para Term Preterm Abortions TAB SAB Ect Mult Living                  Review of Systems  Constitutional: Negative for fever, chills and diaphoresis.  Respiratory: Positive for shortness of breath.   Cardiovascular: Positive for chest pain. Negative for palpitations and leg swelling.  Gastrointestinal: Negative for nausea, vomiting and abdominal pain.  Genitourinary: Negative for dysuria, hematuria and flank pain.  Musculoskeletal: Negative for back pain.  Skin: Negative.   Neurological: Negative for dizziness, weakness, light-headedness and numbness.    Allergies  Review of patient's allergies indicates no known allergies.  Home Medications   Current  Outpatient Rx  Name Route Sig Dispense Refill  . ASPIRIN 81 MG PO TABS Oral Take 81 mg by mouth daily.      Marland Kitchen CALCIUM-VITAMIN D 500-200 MG-UNIT PO TABS Oral Take 1 tablet by mouth 2 (two) times daily with a meal.      . CELECOXIB 100 MG PO CAPS Oral Take 1 capsule (100 mg total) by mouth 2 (two) times daily. Take with meals. 60 capsule 1  . VITAMIN D 1000 UNITS PO TABS Oral Take 1,000 Units by mouth daily.    Marland Kitchen ESOMEPRAZOLE MAGNESIUM 40 MG PO CPDR Oral Take 40 mg by mouth daily before breakfast.    . GABAPENTIN 300 MG PO CAPS Oral Take 1 capsule (300 mg total) by mouth 3 (three) times daily. Take as directed. 90 capsule 1  . HYDROCODONE-ACETAMINOPHEN 10-325 MG PO TABS Oral Take 1 tablet by mouth every 6 (six) hours as needed.      Marland Kitchen METOPROLOL SUCCINATE ER 50 MG PO TB24 Oral Take 50 mg by mouth daily.      . OMEGA-3-ACID ETHYL ESTERS 1 G PO CAPS Oral Take 2 g by mouth 2 (two) times daily.      Marland Kitchen PRAVASTATIN SODIUM 20 MG PO TABS Oral Take 20 mg by mouth daily.    . SUMATRIPTAN SUCCINATE 50 MG PO TABS Oral Take 50 mg by mouth every 2 (  two) hours as needed.        BP 156/76  Pulse 71  Temp 99.1 F (37.3 C) (Oral)  Resp 18  Wt 179 lb (81.194 kg)  SpO2 95%  LMP 02/25/2012  Physical Exam  Nursing note and vitals reviewed. Constitutional: She is oriented to person, place, and time. She appears well-developed and well-nourished. No distress.  Eyes: Conjunctivae are normal.  Neck: Neck supple.  Cardiovascular: Normal rate, regular rhythm and normal heart sounds.   Pulmonary/Chest: Effort normal and breath sounds normal. No respiratory distress. She has no wheezes. She has no rales. She exhibits tenderness.       No left rib deformity, bruising, swelling noted. Tender to palpation over left ribs. No crepitus. Normal chest wall movement  Abdominal: Soft. Bowel sounds are normal. She exhibits no distension. There is no tenderness. There is no rebound.  Musculoskeletal: Normal range of motion.  She exhibits no edema.       No LE swelling, no calf tenderness  Neurological: She is alert and oriented to person, place, and time.  Skin: Skin is warm and dry.  Psychiatric: She has a normal mood and affect.    ED Course  Procedures (including critical care time)  Pt with left rib pain after hitting it on an arm rest. Pt appears very uncomfortable, will try pain medications, x-ray. Lungs are clear on exam, lung sounds present bilat.   Dg Ribs Unilateral W/chest Left  03/26/2012  *RADIOLOGY REPORT*  Clinical Data: Rib injury  LEFT RIBS AND CHEST - 3+ VIEW  Comparison: 06/16/2011  Findings: There is mild cardiac enlargement.  No pleural effusion or edema.  No airspace consolidation noted.  No displaced rib fractures are identified.  IMPRESSION:  1.  No acute findings.  Original Report Authenticated By: Rosealee Albee, M.D.    1:56 PM Pt's pain improved with pain medications. I discussed pt with Dr. Lynelle Doctor who agrees that pain is most likely musculoskeletal. Reproducible with palpation and movement. Onset clearly after hitting in on a chair. PERC negative, doubt PE, she is not on exogenous estrogen, no swelling in calves or other risk factors. VS show hypertension, otherwise negative. Pt's abdomen is benign. Will try stronger pain meds, incentive spirometer, follow up with PCP.   1. Rib contusion       MDM          Lottie Mussel, PA 03/26/12 2023

## 2012-03-26 NOTE — ED Provider Notes (Signed)
Medical screening examination/treatment/procedure(s) were conducted as a shared visit with non-physician practitioner(s) and myself.  I personally evaluated the patient during the encounter  The pain appears to be musculoskeletal in nature. Patient has reproducible pain with palpation and movement. She is not tachypneic and has no risk factors for PE. There is no signs to suggest a vascular etiology such as thoracic dissection x-ray does not show evidence of pneumonia or pneumothorax. Plan will be to treat the patient's pain. We'll adjust her medications and add on a muscle relaxant. Patient will follow up with her Dr. next return be reevaluated. It is possible that there may be a component of the thoracic herniated disc  Celene Kras, MD 03/26/12 1311

## 2012-03-26 NOTE — ED Notes (Signed)
Pt states that last Sunday she was riding in the car and put the arm rest down and fell onto her side onto it.  Pain has gotten worse since then.  Pt states that she can't cough, breathe deep, or lay on her side.  Went to her doctor and was given hydrocodone and methocarbamol but still feels bad.

## 2012-04-13 ENCOUNTER — Ambulatory Visit: Payer: Federal, State, Local not specified - PPO | Admitting: Family Medicine

## 2012-05-10 ENCOUNTER — Other Ambulatory Visit: Payer: Self-pay | Admitting: Cardiothoracic Surgery

## 2012-05-10 DIAGNOSIS — D381 Neoplasm of uncertain behavior of trachea, bronchus and lung: Secondary | ICD-10-CM

## 2012-06-08 ENCOUNTER — Ambulatory Visit (INDEPENDENT_AMBULATORY_CARE_PROVIDER_SITE_OTHER): Payer: Federal, State, Local not specified - PPO | Admitting: Cardiothoracic Surgery

## 2012-06-08 ENCOUNTER — Encounter: Payer: Self-pay | Admitting: Cardiothoracic Surgery

## 2012-06-08 ENCOUNTER — Ambulatory Visit
Admission: RE | Admit: 2012-06-08 | Discharge: 2012-06-08 | Disposition: A | Discharge status: Home / Self Care | Payer: Federal, State, Local not specified - PPO | Source: Ambulatory Visit | Attending: Cardiothoracic Surgery | Admitting: Cardiothoracic Surgery

## 2012-06-08 VITALS — BP 130/80 | HR 60 | Resp 18 | Ht 62.0 in | Wt 179.0 lb

## 2012-06-08 DIAGNOSIS — E328 Other diseases of thymus: Secondary | ICD-10-CM | POA: Insufficient documentation

## 2012-06-08 DIAGNOSIS — D381 Neoplasm of uncertain behavior of trachea, bronchus and lung: Secondary | ICD-10-CM

## 2012-06-08 NOTE — Progress Notes (Signed)
[address]            [place] [address]          [phone]      Cheryn Lundquist [facility] Medical Record #[id number] Date of Birth: 15-Apr-1966  Referring: Governor Rooks, MD Primary Care: Almedia Balls, MD  Chief Complaint:    Chief Complaint  Patient presents with  . Follow-up    1 year f/u with Chest CT, surveillance of Thymic mass    History of Present Illness:      Patient followed by Dr Edwyna Shell for incidental finding of anterior mediastinal mass on CT scan 2008, no change since. Returns today for follow up ct of chest No symptoms of mediastinal mass, no history of Myasthenia gravis   Current Activity/ Functional Status: Patient is independent with mobility/ambulation, transfers, ADL's, IADL's.   Past Medical History  Diagnosis Date  . Chest pain   . HTN (hypertension)   . Migraine   . 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 Thymic cyst     No past surgical history on file.  Family History  Problem Relation Age of Onset  . Diabetes Mother   . Hypertension Mother     History   Social History  . Marital Status: Married    Spouse Name: N/A    Number of Children: N/A  . Years of Education: N/A   Occupational History  . Not on file.   Social History Main Topics  . Smoking status: Never Smoker   . Smokeless tobacco: Not on file  . Alcohol Use: No  . Drug Use: No  . Sexually Active: Not on file   Other Topics Concern  . Not on file   Social History Narrative  . No narrative on file    History  Smoking status  . Never Smoker   Smokeless tobacco  . Not on file    History  Alcohol Use No     No Known Allergies  Current Outpatient Prescriptions  Medication Sig Dispense Refill  . amLODipine-olmesartan (AZOR) 10-40 MG per tablet Take 1 tablet by mouth daily.      Marland Kitchen aspirin 81 MG tablet Take 81 mg by mouth daily.        . Calcium Carbonate-Vitamin D (CALCIUM-VITAMIN D) 500-200 MG-UNIT per  tablet Take 1 tablet by mouth 2 (two) times daily with a meal.        . celecoxib (CELEBREX) 100 MG capsule Take 100 mg by mouth 2 (two) times daily. Take with meals.      . cholecalciferol (VITAMIN D) 1000 UNITS tablet Take 1,000 Units by mouth daily.      Marland Kitchen esomeprazole (NEXIUM) 40 MG capsule Take 40 mg by mouth daily before breakfast.      . metoprolol tartrate (LOPRESSOR) 25 MG tablet Take 25 mg by mouth 2 (two) times daily.      Marland Kitchen omega-3 acid ethyl esters (LOVAZA) 1 G capsule Take 2 g by mouth 2 (two) times daily.        . pravastatin (PRAVACHOL) 20 MG tablet Take 20 mg by mouth daily.           Review of Systems:     Cardiac Review of Systems: Y or N  Chest Pain [ n   ]  Resting SOB [  n ] Exertional SOB  [ n ]  Myer Peer ]   Pedal Edema [   ]    Palpitations [ n ] Syncope  [ n ]   Presyncope [  n ]  General Review of Systems: [Y] = yes [  ]=no Constitional: recent weight change [  ]; anorexia [  ]; fatigue [  ]; nausea [  ]; night sweats [  ]; fever [  ]; or chills [  ];                                                                                                                                          Dental: poor dentition[ n ]; Last Dentist visit: 2 months ago  Eye : blurred vision [  ]; diplopia [   ]; vision changes [  ];  Amaurosis fugax[  ]; Resp: cough [  ];  wheezing[  n];  hemoptysis[  ]; shortness of breath[n  ]; paroxysmal nocturnal dyspnea[ n ]; dyspnea on exertion[  ]; or orthopnea[  ];  GI:  gallstones[  ], vomiting[  ];  dysphagia[  ]; melena[  ];  hematochezia [  ]; heartburn[  ];   Hx of  Colonoscopy[  ]; GU: kidney stones [  ]; hematuria[  ];   dysuria [  ];  nocturia[  ];  history of     obstruction [  ];             Skin: rash, swelling[  ];, hair loss[  ];  peripheral edema[  ];  or itching[  ]; Musculosketetal: myalgias[  ];  joint swelling[  ];  joint erythema[  ];  joint pain[  ];  back pain[  ];  Heme/Lymph: bruising[  ];  bleeding[  ];  anemia[  ];    Neuro: TIA[  ];  headaches[n  ];  stroke[ n ];  vertigo[ n ];  seizures[ n ];   paresthesias[  ];  difficulty walking[n  ];  Psych:depression[  ]; anxiety[  ];  Endocrine: diabetes[y  ];  thyroid dysfunction[  ];  Immunizations: Flu [ yn ]; Pneumococcal[  ];  Other:  Physical Exam: BP 130/80  Pulse 60  Resp 18  Ht 5\' 2"  (1.575 m)  Wt 179 lb (81.194 kg)  BMI 32.74 kg/m2  SpO2 97%  LMP 05/18/2012  General appearance: alert, cooperative and no distress Neurologic: intact Heart: regular rate and rhythm, S1, S2 normal, no murmur, click, rub or gallop and normal apical impulse Lungs: clear to auscultation bilaterally and normal percussion bilaterally Abdomen: soft, non-tender; bowel sounds normal; no masses,  no organomegaly Extremities: extremities normal, atraumatic, no cyanosis or edema and Homans sign is negative, no sign of DVT no palpable masses in neck or axillary area   Diagnostic Studies & Laboratory data:     Recent Radiology Findings:   Ct Chest Wo Contrast  06/08/2012  *RADIOLOGY REPORT*  Clinical Data: Follow-up anterior mediastinal mass.  CT CHEST WITHOUT CONTRAST  Technique:  Multidetector CT imaging of the chest was performed following the standard protocol without IV contrast.  Comparison: Multiple prior chest CTs dating back to 2008.  Findings: Stable solid anterior mediastinal mass which is smoothly marginated and well circumscribed.  It measures 3.5 x 2.5 cm. Maximal measurement back in 2008 was 3.0 x 2.0 cm.  No other mediastinal or hilar mass or adenopathy.  The heart is normal in size.  No pericardial effusion.  Minimal aortic calcifications are stable.  The esophagus is grossly normal.  The lungs are clear.  No pleural effusion or pulmonary nodule.  The tracheobronchial tree is normal.  The upper abdomen is unremarkable and stable.  IMPRESSION:  1.  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 2.  No mediastinal or hilar lymphadenopathy. 3.  No pulmonary  abnormalities.   Original Report Authenticated By: P. Loralie Champagne, M.D.       Recent Lab Findings: Lab Results  Component Value Date   WBC 6.7 05/23/2007   HGB 12.7 05/23/2007   HCT 37.4 05/23/2007   PLT 125* 05/23/2007   GLUCOSE 108* 05/23/2007   CHOL  Value: 218        ATP III CLASSIFICATION:  <200     mg/dL   Desirable  161-096  mg/dL   Borderline High  >=045    mg/dL   High* 11/29/8117   TRIG 112 05/22/2007   HDL 47 05/22/2007   LDLCALC  Value: 149        Total Cholesterol/HDL:CHD Risk Coronary Heart Disease Risk Table                     Men   Women  1/2 Average Risk   3.4   3.3* 05/22/2007   ALT 22 05/21/2007   AST 22 05/21/2007   NA 141 05/23/2007   K 3.3* 05/23/2007   CL 107 05/23/2007   CREATININE 1.13 05/23/2007   BUN 18 05/23/2007   CO2 25 05/23/2007   TSH 1.648 Test methodology is 3rd generation TSH 05/22/2007   INR 1.1 05/21/2007   HGBA1C  Value: 6.6 (NOTE)   The ADA recommends the following therapeutic goals for glycemic   control related to Hgb A1C measurement:   Goal of Therapy:   < 7.0% Hgb A1C   Action Suggested:  > 8.0% Hgb A1C   Ref:  Diabetes Care, 22, Suppl. 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

## 2012-10-30 ENCOUNTER — Ambulatory Visit: Payer: Federal, State, Local not specified - PPO | Attending: Anesthesiology | Admitting: Physical Therapy

## 2012-10-30 DIAGNOSIS — M545 Low back pain, unspecified: Secondary | ICD-10-CM | POA: Insufficient documentation

## 2012-10-30 DIAGNOSIS — IMO0001 Reserved for inherently not codable concepts without codable children: Secondary | ICD-10-CM | POA: Insufficient documentation

## 2012-11-02 ENCOUNTER — Ambulatory Visit: Payer: Federal, State, Local not specified - PPO | Admitting: Physical Therapy

## 2012-11-02 ENCOUNTER — Other Ambulatory Visit: Payer: Self-pay | Admitting: Anesthesiology

## 2012-11-02 DIAGNOSIS — M5030 Other cervical disc degeneration, unspecified cervical region: Secondary | ICD-10-CM

## 2012-11-02 DIAGNOSIS — M545 Low back pain, unspecified: Secondary | ICD-10-CM

## 2012-11-03 ENCOUNTER — Ambulatory Visit: Payer: Federal, State, Local not specified - PPO | Admitting: Physical Therapy

## 2012-11-06 ENCOUNTER — Ambulatory Visit: Payer: Federal, State, Local not specified - PPO | Admitting: Physical Therapy

## 2012-11-07 ENCOUNTER — Ambulatory Visit: Payer: Federal, State, Local not specified - PPO | Admitting: Physical Therapy

## 2012-11-08 ENCOUNTER — Ambulatory Visit
Admission: RE | Admit: 2012-11-08 | Discharge: 2012-11-08 | Disposition: A | Discharge status: Home / Self Care | Payer: Federal, State, Local not specified - PPO | Source: Ambulatory Visit | Attending: Anesthesiology | Admitting: Anesthesiology

## 2012-11-08 DIAGNOSIS — M545 Low back pain, unspecified: Secondary | ICD-10-CM

## 2012-11-08 DIAGNOSIS — M5030 Other cervical disc degeneration, unspecified cervical region: Secondary | ICD-10-CM

## 2012-11-14 ENCOUNTER — Ambulatory Visit: Payer: Federal, State, Local not specified - PPO | Admitting: Physical Therapy

## 2013-01-01 ENCOUNTER — Encounter: Payer: Self-pay | Admitting: Podiatry

## 2013-01-01 ENCOUNTER — Ambulatory Visit (INDEPENDENT_AMBULATORY_CARE_PROVIDER_SITE_OTHER): Payer: Federal, State, Local not specified - PPO | Admitting: Podiatry

## 2013-01-01 VITALS — BP 99/68 | HR 59 | Ht 64.0 in | Wt 179.0 lb

## 2013-01-01 DIAGNOSIS — E1140 Type 2 diabetes mellitus with diabetic neuropathy, unspecified: Secondary | ICD-10-CM | POA: Insufficient documentation

## 2013-01-01 DIAGNOSIS — M21969 Unspecified acquired deformity of unspecified lower leg: Secondary | ICD-10-CM

## 2013-01-01 DIAGNOSIS — E119 Type 2 diabetes mellitus without complications: Secondary | ICD-10-CM

## 2013-01-01 NOTE — Progress Notes (Signed)
SUBJECTIVE: 47 y.o. year old NIDDM female accompanied by her husband presents for diabetic foot care. Patient is referred by Dr. Julio Sicks. Patient walks in limping gait with a walker. 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 Wearing back brace for the past 2 years. Patient is wearing thin cloth made flat shoes. Stated that she does not have problem trimming her nails.  REVIEW OF SYSTEMS: Constitutional: negative for anorexia, fatigue, fevers, night sweats, sweats and weight loss Eyes: negative Ears, nose, mouth, throat, and face: negative Respiratory: negative Cardiovascular: negative Gastrointestinal: negative Integument/breast: negative Musculoskeletal:Lower back DJD, and right knee with muscle spasm. Wears back brace. Neurological: Numbness and tingling on lower leg right, constant.  OBJECTIVE: DERMATOLOGIC EXAMINATION: Nails: Hypertrophic x 10.  VASCULAR EXAMINATION OF LOWER LIMBS: Pedal pulses: All pedal pulses are palpable with normal pulsation.  Capillary Filling times within 3 seconds in all digits.  Edema or ischemic changes absent.  NEUROLOGIC EXAMINATION OF THE LOWER LIMBS: Subjective numbness and tingling sensation on right lower limb. Positive normal response to Monofilament sensory testing bilateral. Normal response to Vibratory sensations bilateral.  MUSCULOSKELETAL EXAMINATION: Positive for hypermobile first ray and weak first Tarsometatarsal joint bilateral.   ASSESSMENT: NIDDM Hypermobile first ray bilateral. Hypertrophic nails x 10.  PLAN: Reviewed diabetic foot care. Advised to wear lace up tennis shoes for weak medial column foot.  Return in 6 months for diabetic foot check.

## 2013-04-04 ENCOUNTER — Other Ambulatory Visit: Payer: Self-pay | Admitting: Anesthesiology

## 2013-04-04 DIAGNOSIS — M542 Cervicalgia: Secondary | ICD-10-CM

## 2013-04-07 ENCOUNTER — Ambulatory Visit
Admission: RE | Admit: 2013-04-07 | Discharge: 2013-04-07 | Disposition: A | Discharge status: Home / Self Care | Payer: Federal, State, Local not specified - PPO | Source: Ambulatory Visit | Attending: Anesthesiology | Admitting: Anesthesiology

## 2013-04-07 DIAGNOSIS — M542 Cervicalgia: Secondary | ICD-10-CM

## 2013-08-01 ENCOUNTER — Emergency Department (HOSPITAL_BASED_OUTPATIENT_CLINIC_OR_DEPARTMENT_OTHER)
Admission: EM | Admit: 2013-08-01 | Discharge: 2013-08-02 | Disposition: A | Discharge status: Home / Self Care | Payer: Federal, State, Local not specified - PPO | Attending: Emergency Medicine | Admitting: Emergency Medicine

## 2013-08-01 ENCOUNTER — Encounter (HOSPITAL_BASED_OUTPATIENT_CLINIC_OR_DEPARTMENT_OTHER): Payer: Self-pay | Admitting: Emergency Medicine

## 2013-08-01 ENCOUNTER — Emergency Department (HOSPITAL_BASED_OUTPATIENT_CLINIC_OR_DEPARTMENT_OTHER): Payer: Federal, State, Local not specified - PPO

## 2013-08-01 DIAGNOSIS — R011 Cardiac murmur, unspecified: Secondary | ICD-10-CM | POA: Insufficient documentation

## 2013-08-01 DIAGNOSIS — B9789 Other viral agents as the cause of diseases classified elsewhere: Secondary | ICD-10-CM | POA: Insufficient documentation

## 2013-08-01 DIAGNOSIS — Z8639 Personal history of other endocrine, nutritional and metabolic disease: Secondary | ICD-10-CM | POA: Insufficient documentation

## 2013-08-01 DIAGNOSIS — Z79899 Other long term (current) drug therapy: Secondary | ICD-10-CM | POA: Insufficient documentation

## 2013-08-01 DIAGNOSIS — G43909 Migraine, unspecified, not intractable, without status migrainosus: Secondary | ICD-10-CM | POA: Insufficient documentation

## 2013-08-01 DIAGNOSIS — B349 Viral infection, unspecified: Secondary | ICD-10-CM

## 2013-08-01 DIAGNOSIS — Z791 Long term (current) use of non-steroidal anti-inflammatories (NSAID): Secondary | ICD-10-CM | POA: Insufficient documentation

## 2013-08-01 DIAGNOSIS — R42 Dizziness and giddiness: Secondary | ICD-10-CM | POA: Insufficient documentation

## 2013-08-01 DIAGNOSIS — M5137 Other intervertebral disc degeneration, lumbosacral region: Secondary | ICD-10-CM | POA: Insufficient documentation

## 2013-08-01 DIAGNOSIS — I10 Essential (primary) hypertension: Secondary | ICD-10-CM | POA: Insufficient documentation

## 2013-08-01 DIAGNOSIS — R509 Fever, unspecified: Secondary | ICD-10-CM | POA: Insufficient documentation

## 2013-08-01 DIAGNOSIS — Z7982 Long term (current) use of aspirin: Secondary | ICD-10-CM | POA: Insufficient documentation

## 2013-08-01 DIAGNOSIS — Z862 Personal history of diseases of the blood and blood-forming organs and certain disorders involving the immune mechanism: Secondary | ICD-10-CM | POA: Insufficient documentation

## 2013-08-01 DIAGNOSIS — M51379 Other intervertebral disc degeneration, lumbosacral region without mention of lumbar back pain or lower extremity pain: Secondary | ICD-10-CM | POA: Insufficient documentation

## 2013-08-01 LAB — COMPREHENSIVE METABOLIC PANEL
AST: 17 U/L (ref 0–37)
BUN: 8 mg/dL (ref 6–23)
CO2: 27 mEq/L (ref 19–32)
Calcium: 9.7 mg/dL (ref 8.4–10.5)
Chloride: 100 mEq/L (ref 96–112)
Creatinine, Ser: 0.9 mg/dL (ref 0.50–1.10)
GFR calc non Af Amer: 75 mL/min — ABNORMAL LOW (ref >90)
Total Bilirubin: 0.6 mg/dL (ref 0.3–1.2)

## 2013-08-01 LAB — URINALYSIS, ROUTINE W REFLEX MICROSCOPIC
Glucose, UA: NEGATIVE mg/dL
Ketones, ur: NEGATIVE mg/dL
Nitrite: NEGATIVE
Protein, ur: NEGATIVE mg/dL
Urobilinogen, UA: 0.2 mg/dL (ref 0.0–1.0)

## 2013-08-01 LAB — LIPASE, BLOOD: Lipase: 46 U/L (ref 11–59)

## 2013-08-01 LAB — CBC WITH DIFFERENTIAL/PLATELET
Basophils Absolute: 0 10*3/uL (ref 0.0–0.1)
Basophils Relative: 0 % (ref 0–1)
Eosinophils Relative: 1 % (ref 0–5)
HCT: 36.3 % (ref 36.0–46.0)
Hemoglobin: 12.9 g/dL (ref 12.0–15.0)
Lymphocytes Relative: 11 % — ABNORMAL LOW (ref 12–46)
MCHC: 35.5 g/dL (ref 30.0–36.0)
MCV: 84.4 fL (ref 78.0–100.0)
Monocytes Absolute: 0.3 10*3/uL (ref 0.1–1.0)
Monocytes Relative: 5 % (ref 3–12)
RDW: 13.1 % (ref 11.5–15.5)

## 2013-08-01 LAB — CK: Total CK: 96 U/L (ref 7–177)

## 2013-08-01 MED ORDER — PROMETHAZINE-DM 6.25-15 MG/5ML PO SYRP
5.0000 mL | ORAL_SOLUTION | Freq: Four times a day (QID) | ORAL | Status: DC | PRN
Start: 2013-08-01 — End: 2018-01-26

## 2013-08-01 MED ORDER — SODIUM CHLORIDE 0.9 % IV SOLN
1000.0000 mL | Freq: Once | INTRAVENOUS | Status: AC
  Administered 2013-08-01: 1000 mL via INTRAVENOUS

## 2013-08-01 MED ORDER — HYDROMORPHONE HCL PF 1 MG/ML IJ SOLN
0.5000 mg | Freq: Once | INTRAMUSCULAR | Status: AC
  Administered 2013-08-01: 0.5 mg via INTRAVENOUS
  Filled 2013-08-01: qty 1

## 2013-08-01 MED ORDER — ONDANSETRON HCL 4 MG/2ML IJ SOLN
4.0000 mg | Freq: Once | INTRAMUSCULAR | Status: AC
  Administered 2013-08-01: 4 mg via INTRAVENOUS
  Filled 2013-08-01: qty 2

## 2013-08-01 MED ORDER — SODIUM CHLORIDE 0.9 % IV SOLN: 1000.0000 mL | Freq: Once | INTRAVENOUS | Status: DC

## 2013-08-01 MED ORDER — ACETAMINOPHEN 500 MG PO TABS
1000.0000 mg | ORAL_TABLET | Freq: Once | ORAL | Status: AC
  Administered 2013-08-01: 1000 mg via ORAL
  Filled 2013-08-01: qty 2

## 2013-08-01 NOTE — ED Notes (Signed)
Pt placed on heart monitor.

## 2013-08-01 NOTE — ED Notes (Signed)
Generalized body aches and fever x 2days,    Has not checked temp at home

## 2013-08-01 NOTE — ED Provider Notes (Signed)
CSN: [id number]     Arrival date & time 08/01/13  1953 History   First MD Initiated Contact with Patient 08/01/13 2024      This chart was scribed for Gerhard Munch, MD by Ellin Mayhew, ED Scribe. This patient was seen in room MH06/MH06 and the patient's care was started at 8:37 PM.  Chief Complaint  Patient presents with  . Generalized Body Aches   The history is provided by the patient. No language interpreter was used.   HPI Comments: [name] is a 47 y.o. female who presents to the Emergency Department complaining of myalgias with associated chills, fever and diziness that began three days ago. Patient states that her symptoms alternate from chills to hot flashes throughout the day and rates her pain as an 8/10. She denies taking her temperature at home. Fever is 100.2 degrees in the ED. She states she has been urinating more frequently .She denies any cough, sore throat, confusion, vomiting, or rashes. She has been taking cold medicine with no relief. She has a history of back disease, HTN, and pre-diabetes. She denies any liver, pancreas, or cardiac disease. Husband reports a murmur diagnosed five years ago with an echocardiogram that showed it was benign.    Past Medical History  Diagnosis Date  . Chest pain   . HTN (hypertension)   . Migraine   . 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 Thymic cyst    History reviewed. No pertinent past surgical history. Family History  Problem Relation Age of Onset  . Diabetes Mother   . Hypertension Mother    History  Substance Use Topics  . Smoking status: Never Smoker   . Smokeless tobacco: Never Used  . Alcohol Use: No   OB History   Grav Para Term Preterm Abortions TAB SAB Ect Mult Living                 Review of Systems  A complete 10 system review of systems was obtained and all systems are negative except as noted in the HPI and PMH.    Allergies  Review of patient's allergies indicates no known  allergies.  Home Medications   Current Outpatient Rx  Name  Route  Sig  Dispense  Refill  . amLODipine-olmesartan (AZOR) 10-40 MG per tablet   Oral   Take 1 tablet by mouth daily.         Marland Kitchen aspirin 81 MG tablet   Oral   Take 81 mg by mouth daily.           . Calcium Carbonate-Vitamin D (CALCIUM-VITAMIN D) 500-200 MG-UNIT per tablet   Oral   Take 1 tablet by mouth 2 (two) times daily with a meal.           . celecoxib (CELEBREX) 100 MG capsule   Oral   Take 100 mg by mouth 2 (two) times daily. Take with meals.         . cholecalciferol (VITAMIN D) 1000 UNITS tablet   Oral   Take 1,000 Units by mouth daily.         Marland Kitchen esomeprazole (NEXIUM) 40 MG capsule   Oral   Take 40 mg by mouth daily before breakfast.         . metoprolol tartrate (LOPRESSOR) 25 MG tablet   Oral   Take 25 mg by mouth 2 (two) times daily.         Marland Kitchen omega-3 acid ethyl esters (  LOVAZA) 1 G capsule   Oral   Take 2 g by mouth 2 (two) times daily.           Marland Kitchen oxycodone (OXY-IR) 5 MG capsule   Oral   Take 5 mg by mouth every 4 (four) hours as needed.         . pravastatin (PRAVACHOL) 20 MG tablet   Oral   Take 20 mg by mouth daily.         . pregabalin (LYRICA) 50 MG capsule   Oral   Take 50 mg by mouth 3 (three) times daily.         Marland Kitchen tiZANidine (ZANAFLEX) 4 MG capsule   Oral   Take 4 mg by mouth 3 (three) times daily as needed for muscle spasms.          Triage Vitals: BP 168/74  Pulse 97  Temp(Src) 100.2 F (37.9 C) (Oral)  Resp 16  Ht 5\' 3"  (1.6 m)  Wt 180 lb (81.647 kg)  BMI 31.89 kg/m2  SpO2 100%  LMP 05/18/2012  Physical Exam  Nursing note and vitals reviewed. Constitutional: She is oriented to person, place, and time. She appears well-developed and well-nourished. No distress.  HENT:  Head: Normocephalic and atraumatic.  Eyes: Conjunctivae and EOM are normal.  Cardiovascular: Normal rate and regular rhythm.   Murmur (blowing systolic)  heard. Pulmonary/Chest: Effort normal and breath sounds normal. No stridor. No respiratory distress.  Abdominal: She exhibits no distension.  Musculoskeletal: She exhibits no edema.  Neurological: She is alert and oriented to person, place, and time. No cranial nerve deficit.  Skin: Skin is warm and dry.  Psychiatric: She has a normal mood and affect.    ED Course  Procedures (including critical care time)  Medications  ondansetron (ZOFRAN) injection 4 mg (not administered)  0.9 %  sodium chloride infusion (1,000 mLs Intravenous New Bag/Given 08/01/13 2058)  HYDROmorphone (DILAUDID) injection 0.5 mg (not administered)  acetaminophen (TYLENOL) tablet 1,000 mg (not administered)   DIAGNOSTIC STUDIES: Oxygen Saturation is 100% on room air, normal by my interpretation.    COORDINATION OF CARE: 8:40 PM- Medication ordered, UA , fluids and CBC ordered. Treatment plan discussed and patient agreed.  10:30 PM-F/U with patient and she reports feeling better with a temperature of 99 degrees at the ED.    11:49 PM On recent reevaluation the patient is smiling.  She states that she feels better.  Vital signs are stable.  I discussed all findings with her and her husband.  Also discussed the need for outpatient followup. Labs Review Labs Reviewed  CBC WITH DIFFERENTIAL  COMPREHENSIVE METABOLIC PANEL  LIPASE, BLOOD  URINALYSIS, ROUTINE W REFLEX MICROSCOPIC  CK   Imaging Review No results found.  EKG Interpretation   None       MDM   1. Fever   2. Viral syndrome     I personally performed the services described in this documentation, which was scribed in my presence. The recorded information has been reviewed and is accurate.  Patient presents with fever, influenza-like complaints.  Patient has received her flu vaccine, his symptoms have been present for greater than 3 days, meaning she will not benefit from Oseltamivir. Patient's evaluation is notable for mild leukocytosis.   Patient recently switched her with fluids, medication is no evidence of distress, nor imminent T. consultation or systemic illness.  With this improvement, she was discharged in stable condition to follow up with her primary care physician.  Gerhard Munch, MD 08/01/13 2351

## 2013-08-01 NOTE — ED Notes (Signed)
Pt c/o body aches fever and chills x 2 days

## 2014-05-06 ENCOUNTER — Ambulatory Visit: Payer: Federal, State, Local not specified - PPO | Attending: Anesthesiology

## 2014-05-16 ENCOUNTER — Other Ambulatory Visit: Payer: Self-pay | Admitting: *Deleted

## 2014-05-16 DIAGNOSIS — E328 Other diseases of thymus: Secondary | ICD-10-CM

## 2014-06-27 ENCOUNTER — Ambulatory Visit
Admission: RE | Admit: 2014-06-27 | Discharge: 2014-06-27 | Disposition: A | Discharge status: Home / Self Care | Payer: Federal, State, Local not specified - PPO | Source: Ambulatory Visit | Attending: Cardiothoracic Surgery | Admitting: Cardiothoracic Surgery

## 2014-06-27 ENCOUNTER — Ambulatory Visit (INDEPENDENT_AMBULATORY_CARE_PROVIDER_SITE_OTHER): Payer: Federal, State, Local not specified - PPO | Admitting: Cardiothoracic Surgery

## 2014-06-27 ENCOUNTER — Encounter: Payer: Self-pay | Admitting: Cardiothoracic Surgery

## 2014-06-27 VITALS — BP 129/84 | HR 55 | Ht 63.0 in | Wt 185.0 lb

## 2014-06-27 DIAGNOSIS — E328 Other diseases of thymus: Secondary | ICD-10-CM

## 2014-06-27 DIAGNOSIS — R222 Localized swelling, mass and lump, trunk: Secondary | ICD-10-CM

## 2014-06-27 DIAGNOSIS — J9859 Other diseases of mediastinum, not elsewhere classified: Secondary | ICD-10-CM

## 2014-06-27 NOTE — Progress Notes (Signed)
[facility] [address]       [place] [address]             [phone]                      [name] [facility] Medical Record #[id number] Date of Birth: 1966/07/03  Referring: Benito Mccreedy, MD Primary Care: Benito Mccreedy, MD  Chief Complaint:    Chief Complaint  Patient presents with  . 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 Returns today for follow up ct of chest No symptoms of mediastinal mass, no history of Myasthenia gravis.   Current Activity/ Functional Status: Patient is independent with mobility/ambulation, transfers, ADL's, IADL's.   Past Medical History  Diagnosis Date  . Chest pain   . HTN (hypertension)   . Migraine   . 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 Thymic cyst     No past surgical history on file.  Family History  Problem Relation Age of Onset  . Diabetes Mother   . Hypertension Mother     History   Social History  . Marital Status: Married    Spouse Name: N/A    Number of Children: N/A  . Years of Education: N/A   Occupational History  . Not on file.   Social History Main Topics  . Smoking status: Never Smoker   . Smokeless tobacco: Never Used  . Alcohol Use: No  . Drug Use: No  . Sexual Activity: Not on file   Other Topics Concern  . Not on file   Social History Narrative    History  Smoking status  . Never Smoker   Smokeless tobacco  . Never Used    History  Alcohol Use No     No Known Allergies  Current Outpatient Prescriptions  Medication Sig Dispense Refill  . amLODipine-olmesartan (AZOR) 10-40 MG per tablet Take 1 tablet by mouth daily.    Marland Kitchen aspirin 81 MG tablet Take 81 mg by mouth daily.      . Calcium Carbonate-Vitamin D (CALCIUM-VITAMIN D) 500-200 MG-UNIT per tablet Take 1 tablet by mouth 2 (two)  times daily with a meal.      . celecoxib (CELEBREX) 100 MG capsule Take 100 mg by mouth 2 (two) times daily. Take with meals.    . cholecalciferol (VITAMIN D) 1000 UNITS tablet Take 1,000 Units by mouth daily.    Marland Kitchen esomeprazole (NEXIUM) 40 MG capsule Take 40 mg by mouth daily before breakfast.    . METFORMIN HCL PO Take 250 mg by mouth daily.    . metoprolol tartrate (LOPRESSOR) 25 MG tablet Take 25 mg by mouth 2 (two) times daily.    Marland Kitchen omega-3 acid ethyl esters (LOVAZA) 1 G capsule Take 15 g by mouth 2 (two) times daily.     Marland Kitchen PARoxetine Mesylate (BRISDELLE) 7.5 MG CAPS Take 7.5 mg by mouth daily.    . pravastatin (PRAVACHOL) 20 MG tablet Take 20 mg by mouth daily.    . pregabalin (LYRICA) 50 MG capsule Take 150 mg by mouth 3 (three) times daily.     Marland Kitchen tiZANidine (ZANAFLEX) 4 MG capsule Take 4 mg by mouth 3 (three) times daily as needed  for muscle spasms.    Marland Kitchen oxycodone (OXY-IR) 5 MG capsule Take 5 mg by mouth every 4 (four) hours as needed.    . promethazine-dextromethorphan (PROMETHAZINE-DM) 6.25-15 MG/5ML syrup Take 5 mLs by mouth 4 (four) times daily as needed for cough. 118 mL 0   No current facility-administered medications for this visit.       Review of Systems:     Cardiac Review of Systems: Y or N  Chest Pain [ n   ]  Resting SOB [  n ] Exertional SOB  [ n ]  Orthopnea [n ]   Pedal Edema [   ]    Palpitations [ n ] Syncope  [ n ]   Presyncope [  n ]  General Review of Systems: [Y] = yes [  ]=no Constitional: recent weight change [  ]; anorexia [  ]; fatigue [  ]; nausea [  ]; night sweats [  ]; fever [  ]; or chills [  ];                                                                                                                                          Dental: poor dentition[ n ]; Last Dentist visit: 2 months ago  Eye : blurred vision [  ]; diplopia [   ]; vision changes [  ];  Amaurosis fugax[  ]; Resp: cough [  ];  wheezing[  n];  hemoptysis[  ]; shortness of breath[n   ]; paroxysmal nocturnal dyspnea[ n ]; dyspnea on exertion[  ]; or orthopnea[  ];  GI:  gallstones[  ], vomiting[  ];  dysphagia[  ]; melena[  ];  hematochezia [  ]; heartburn[  ];   Hx of  Colonoscopy[  ]; GU: kidney stones [  ]; hematuria[  ];   dysuria [  ];  nocturia[  ];  history of     obstruction [  ];             Skin: rash, swelling[  ];, hair loss[  ];  peripheral edema[  ];  or itching[  ]; Musculosketetal: myalgias[  ];  joint swelling[  ];  joint erythema[  ];  joint pain[  ];  back pain[  ];  Heme/Lymph: bruising[  ];  bleeding[  ];  anemia[  ];  Neuro: TIA[  ];  headaches[n  ];  stroke[ n ];  vertigo[ n ];  seizures[ n ];   paresthesias[  ];  difficulty walking[n  ];  Psych:depression[  ]; anxiety[  ];  Endocrine: diabetes[y  ];  thyroid dysfunction[  ];  Immunizations: Flu [ yn ]; Pneumococcal[  ];  Other:  Physical Exam: BP 129/84 mmHg  Pulse 55  Ht 5\' 3"  (1.6 m)  Wt 185 lb (83.915 kg)  BMI 32.78 kg/m2  SpO2 97%  LMP 05/18/2012  General appearance:  alert, cooperative and no distress Neurologic: intact Heart: regular rate and rhythm, S1, S2 normal, no murmur, click, rub or gallop and normal apical impulse Lungs: clear to auscultation bilaterally and normal percussion bilaterally Abdomen: soft, non-tender; bowel sounds normal; no masses,  no organomegaly Extremities: extremities normal, atraumatic, no cyanosis or edema and Homans sign is negative, no sign of DVT no palpable masses in neck or axillary area Patient currently wearing a brace for thoracic spine  Diagnostic Studies & Laboratory data:     Recent Radiology Findings:   Ct Chest Wo Contrast  06/27/2014   CLINICAL DATA:  Followup anterior mediastinal mass.  EXAM: CT CHEST WITHOUT CONTRAST  TECHNIQUE: Multidetector CT imaging of the chest was performed following the standard protocol without IV contrast.  COMPARISON:  CT 06/08/2012, 04/13/2010, 04/23/2009  FINDINGS: In the anterior mediastinum, there is an ovoid  lesion measuring 3.5 x 2.2 cm compared to 3.5 x 2.2 cm on prior. Multiple additional comparison exams demonstrate stability. Lesion is relatively high density similar prior. Lesions does not invade the adjacent structures within the anterior mediastinum.  No pericardial fluid. No mediastinal lymphadenopathy. No supraclavicular adenopathy. No axillary adenopathy  Review of the lung parenchyma demonstrates no suspicious pulmonary nodule. There is no pleural nodularity.  No aggressive osseous lesion  IMPRESSION: 1. Stable smoothly marginated anterior mediastinal mass over 5 year interval. 2. No pulmonary or pleural nodularity.   Electronically Signed   By: Suzy Bouchard M.D.   On: 06/27/2014 15:03      Recent Lab Findings: Lab Results  Component Value Date   WBC 6.7 05/23/2007   HGB 12.7 05/23/2007   HCT 37.4 05/23/2007   PLT 125* 05/23/2007   GLUCOSE 108* 05/23/2007   CHOL  Value: 218        ATP III CLASSIFICATION:  <200     mg/dL   Desirable  200-239  mg/dL   Borderline High  >=240    mg/dL   High* 05/22/2007   TRIG 112 05/22/2007   HDL 47 05/22/2007   LDLCALC  Value: 149        Total Cholesterol/HDL:CHD Risk Coronary Heart Disease Risk Table                     Men   Women  1/2 Average Risk   3.4   3.3* 05/22/2007   ALT 22 05/21/2007   AST 22 05/21/2007   NA 141 05/23/2007   K 3.3* 05/23/2007   CL 107 05/23/2007   CREATININE 1.13 05/23/2007   BUN 18 05/23/2007   CO2 25 05/23/2007   TSH 1.648 Test methodology is 3rd generation TSH 05/22/2007   INR 1.1 05/21/2007   HGBA1C  Value: 6.6 (NOTE)   The ADA recommends the following therapeutic goals for glycemic   control related to Hgb A1C measurement:   Goal of Therapy:   < 7.0% Hgb A1C   Action Suggested:  > 8.0% Hgb A1C   Ref:  Diabetes Care, 22, Suppl. 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  I've not made a return appointment for her to see me but would be glad to see her at her or primary care's requested anytime  Grace Isaac MD  Beeper [phone] Office [phone] 06/27/2014 4:26 PM

## 2014-07-25 ENCOUNTER — Ambulatory Visit: Payer: Federal, State, Local not specified - PPO | Attending: Anesthesiology

## 2014-07-25 DIAGNOSIS — Z6830 Body mass index (BMI) 30.0-30.9, adult: Secondary | ICD-10-CM | POA: Diagnosis not present

## 2014-07-25 DIAGNOSIS — M545 Low back pain, unspecified: Secondary | ICD-10-CM

## 2014-07-25 DIAGNOSIS — E119 Type 2 diabetes mellitus without complications: Secondary | ICD-10-CM | POA: Insufficient documentation

## 2014-07-25 DIAGNOSIS — E669 Obesity, unspecified: Secondary | ICD-10-CM | POA: Diagnosis not present

## 2014-07-25 DIAGNOSIS — I10 Essential (primary) hypertension: Secondary | ICD-10-CM | POA: Insufficient documentation

## 2014-07-25 NOTE — Patient Instructions (Signed)
[name] was asked to not over exert herself and to take medication and rest PRN over next 2 days to ease any testing soreness.

## 2014-07-25 NOTE — Therapy (Signed)
Outpatient Rehabilitation [facility] [address] [place], [place], [address] Phone: [phone]   Fax:  [phone]  Physical Therapy Evaluation  Patient Details  Name: [name] MRN: [id number] Date of Birth: [birth year]  Encounter Date: 07/25/2014      PT End of Session - 07/25/14 1226    Visit Number 1   Number of Visits 1   PT Start Time 0950   PT Stop Time 1150   PT Time Calculation (min) 120 min   Activity Tolerance Patient limited by pain   Behavior During Therapy [facility] for tasks assessed/performed      Past Medical History  Diagnosis Date  . Chest pain   . HTN (hypertension)   . Migraine   . 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 Thymic cyst     No past surgical history on file.  LMP 05/18/2012  Visit Diagnosis:  Right-sided low back pain without sciatica    Ms Topete completed the FCE process with incr pain. A full report will be faxed to DR Dakwa.               Plan - 07/25/14 1226    Clinical Impression Statement Ms Luchsinger complede the FCE rated at less than sedentary level of work . She had incr pain in bac from 7/10 to 8-9/10.    Consulted and Agree with Plan of Care Patient                              Problem List Patient Active Problem List   Diagnosis Date Noted  . Metatarsal deformity 01/01/2013  . Diabetes 01/01/2013  . 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 DDD (degenerative disc disease), lumbar 03/03/2012  . Obesity (BMI 30.0-34.9) 03/03/2012  . HTN (hypertension)   . Migraine     Darrel Hoover PT 07/25/2014, 12:30 PM

## 2016-05-25 DIAGNOSIS — M545 Low back pain: Secondary | ICD-10-CM | POA: Diagnosis not present

## 2016-06-04 DIAGNOSIS — G894 Chronic pain syndrome: Secondary | ICD-10-CM | POA: Diagnosis not present

## 2016-06-04 DIAGNOSIS — M5441 Lumbago with sciatica, right side: Secondary | ICD-10-CM | POA: Diagnosis not present

## 2016-06-04 DIAGNOSIS — M5442 Lumbago with sciatica, left side: Secondary | ICD-10-CM | POA: Diagnosis not present

## 2016-06-04 DIAGNOSIS — M545 Low back pain: Secondary | ICD-10-CM | POA: Diagnosis not present

## 2016-06-04 DIAGNOSIS — Z79891 Long term (current) use of opiate analgesic: Secondary | ICD-10-CM | POA: Diagnosis not present

## 2016-06-07 DIAGNOSIS — E785 Hyperlipidemia, unspecified: Secondary | ICD-10-CM | POA: Diagnosis not present

## 2016-06-07 DIAGNOSIS — E559 Vitamin D deficiency, unspecified: Secondary | ICD-10-CM | POA: Diagnosis not present

## 2016-06-07 DIAGNOSIS — E119 Type 2 diabetes mellitus without complications: Secondary | ICD-10-CM | POA: Diagnosis not present

## 2016-06-07 DIAGNOSIS — K219 Gastro-esophageal reflux disease without esophagitis: Secondary | ICD-10-CM | POA: Diagnosis not present

## 2016-06-07 DIAGNOSIS — Z23 Encounter for immunization: Secondary | ICD-10-CM | POA: Diagnosis not present

## 2016-06-07 DIAGNOSIS — I10 Essential (primary) hypertension: Secondary | ICD-10-CM | POA: Diagnosis not present

## 2016-07-02 DIAGNOSIS — M25561 Pain in right knee: Secondary | ICD-10-CM | POA: Diagnosis not present

## 2016-07-02 DIAGNOSIS — M545 Low back pain: Secondary | ICD-10-CM | POA: Diagnosis not present

## 2016-07-02 DIAGNOSIS — Z79891 Long term (current) use of opiate analgesic: Secondary | ICD-10-CM | POA: Diagnosis not present

## 2016-07-02 DIAGNOSIS — M542 Cervicalgia: Secondary | ICD-10-CM | POA: Diagnosis not present

## 2016-07-02 DIAGNOSIS — G894 Chronic pain syndrome: Secondary | ICD-10-CM | POA: Diagnosis not present

## 2016-07-30 DIAGNOSIS — G894 Chronic pain syndrome: Secondary | ICD-10-CM | POA: Diagnosis not present

## 2016-07-30 DIAGNOSIS — Z79891 Long term (current) use of opiate analgesic: Secondary | ICD-10-CM | POA: Diagnosis not present

## 2016-07-30 DIAGNOSIS — M545 Low back pain: Secondary | ICD-10-CM | POA: Diagnosis not present

## 2016-07-30 DIAGNOSIS — M25561 Pain in right knee: Secondary | ICD-10-CM | POA: Diagnosis not present

## 2016-07-30 DIAGNOSIS — M542 Cervicalgia: Secondary | ICD-10-CM | POA: Diagnosis not present

## 2016-08-09 DIAGNOSIS — E119 Type 2 diabetes mellitus without complications: Secondary | ICD-10-CM | POA: Diagnosis not present

## 2016-08-09 DIAGNOSIS — I10 Essential (primary) hypertension: Secondary | ICD-10-CM | POA: Diagnosis not present

## 2016-08-09 DIAGNOSIS — K219 Gastro-esophageal reflux disease without esophagitis: Secondary | ICD-10-CM | POA: Diagnosis not present

## 2016-08-09 DIAGNOSIS — E785 Hyperlipidemia, unspecified: Secondary | ICD-10-CM | POA: Diagnosis not present

## 2016-09-03 DIAGNOSIS — K080 Exfoliation of teeth due to systemic causes: Secondary | ICD-10-CM | POA: Diagnosis not present

## 2016-09-10 DIAGNOSIS — M545 Low back pain: Secondary | ICD-10-CM | POA: Diagnosis not present

## 2016-09-10 DIAGNOSIS — M542 Cervicalgia: Secondary | ICD-10-CM | POA: Diagnosis not present

## 2016-09-10 DIAGNOSIS — M25561 Pain in right knee: Secondary | ICD-10-CM | POA: Diagnosis not present

## 2016-09-10 DIAGNOSIS — Z79891 Long term (current) use of opiate analgesic: Secondary | ICD-10-CM | POA: Diagnosis not present

## 2016-09-10 DIAGNOSIS — G894 Chronic pain syndrome: Secondary | ICD-10-CM | POA: Diagnosis not present

## 2016-10-08 DIAGNOSIS — M25562 Pain in left knee: Secondary | ICD-10-CM | POA: Diagnosis not present

## 2016-10-08 DIAGNOSIS — G894 Chronic pain syndrome: Secondary | ICD-10-CM | POA: Diagnosis not present

## 2016-10-08 DIAGNOSIS — M542 Cervicalgia: Secondary | ICD-10-CM | POA: Diagnosis not present

## 2016-10-08 DIAGNOSIS — M545 Low back pain: Secondary | ICD-10-CM | POA: Diagnosis not present

## 2016-10-08 DIAGNOSIS — Z79891 Long term (current) use of opiate analgesic: Secondary | ICD-10-CM | POA: Diagnosis not present

## 2016-11-11 DIAGNOSIS — Z01419 Encounter for gynecological examination (general) (routine) without abnormal findings: Secondary | ICD-10-CM | POA: Diagnosis not present

## 2016-11-12 DIAGNOSIS — Z1231 Encounter for screening mammogram for malignant neoplasm of breast: Secondary | ICD-10-CM | POA: Diagnosis not present

## 2016-11-16 DIAGNOSIS — Z298 Encounter for other specified prophylactic measures: Secondary | ICD-10-CM | POA: Diagnosis not present

## 2016-11-16 DIAGNOSIS — B373 Candidiasis of vulva and vagina: Secondary | ICD-10-CM | POA: Diagnosis not present

## 2016-11-16 DIAGNOSIS — E119 Type 2 diabetes mellitus without complications: Secondary | ICD-10-CM | POA: Diagnosis not present

## 2016-11-16 DIAGNOSIS — E785 Hyperlipidemia, unspecified: Secondary | ICD-10-CM | POA: Diagnosis not present

## 2016-11-18 DIAGNOSIS — M542 Cervicalgia: Secondary | ICD-10-CM | POA: Diagnosis not present

## 2016-11-18 DIAGNOSIS — M545 Low back pain: Secondary | ICD-10-CM | POA: Diagnosis not present

## 2016-11-18 DIAGNOSIS — G894 Chronic pain syndrome: Secondary | ICD-10-CM | POA: Diagnosis not present

## 2016-11-18 DIAGNOSIS — M25562 Pain in left knee: Secondary | ICD-10-CM | POA: Diagnosis not present

## 2017-11-01 DIAGNOSIS — K080 Exfoliation of teeth due to systemic causes: Secondary | ICD-10-CM | POA: Diagnosis not present

## 2017-11-17 DIAGNOSIS — Z124 Encounter for screening for malignant neoplasm of cervix: Secondary | ICD-10-CM | POA: Diagnosis not present

## 2017-11-22 DIAGNOSIS — I10 Essential (primary) hypertension: Secondary | ICD-10-CM | POA: Diagnosis not present

## 2017-11-22 DIAGNOSIS — M25511 Pain in right shoulder: Secondary | ICD-10-CM | POA: Diagnosis not present

## 2017-11-22 DIAGNOSIS — Z0000 Encounter for general adult medical examination without abnormal findings: Secondary | ICD-10-CM | POA: Diagnosis not present

## 2017-11-22 DIAGNOSIS — K219 Gastro-esophageal reflux disease without esophagitis: Secondary | ICD-10-CM | POA: Diagnosis not present

## 2017-11-22 DIAGNOSIS — Z0110 Encounter for examination of ears and hearing without abnormal findings: Secondary | ICD-10-CM | POA: Diagnosis not present

## 2017-11-22 DIAGNOSIS — E785 Hyperlipidemia, unspecified: Secondary | ICD-10-CM | POA: Diagnosis not present

## 2017-11-22 DIAGNOSIS — E559 Vitamin D deficiency, unspecified: Secondary | ICD-10-CM | POA: Diagnosis not present

## 2017-11-22 DIAGNOSIS — G609 Hereditary and idiopathic neuropathy, unspecified: Secondary | ICD-10-CM | POA: Diagnosis not present

## 2017-11-22 DIAGNOSIS — G43909 Migraine, unspecified, not intractable, without status migrainosus: Secondary | ICD-10-CM | POA: Diagnosis not present

## 2017-11-22 DIAGNOSIS — E119 Type 2 diabetes mellitus without complications: Secondary | ICD-10-CM | POA: Diagnosis not present

## 2017-11-22 DIAGNOSIS — M545 Low back pain: Secondary | ICD-10-CM | POA: Diagnosis not present

## 2017-11-29 DIAGNOSIS — Z1231 Encounter for screening mammogram for malignant neoplasm of breast: Secondary | ICD-10-CM | POA: Diagnosis not present

## 2017-11-29 DIAGNOSIS — Z780 Asymptomatic menopausal state: Secondary | ICD-10-CM | POA: Diagnosis not present

## 2017-11-29 DIAGNOSIS — Z13820 Encounter for screening for osteoporosis: Secondary | ICD-10-CM | POA: Diagnosis not present

## 2017-12-01 DIAGNOSIS — K5904 Chronic idiopathic constipation: Secondary | ICD-10-CM | POA: Diagnosis not present

## 2017-12-01 DIAGNOSIS — Z1211 Encounter for screening for malignant neoplasm of colon: Secondary | ICD-10-CM | POA: Diagnosis not present

## 2017-12-01 DIAGNOSIS — E669 Obesity, unspecified: Secondary | ICD-10-CM | POA: Diagnosis not present

## 2017-12-01 DIAGNOSIS — K219 Gastro-esophageal reflux disease without esophagitis: Secondary | ICD-10-CM | POA: Diagnosis not present

## 2017-12-06 DIAGNOSIS — I10 Essential (primary) hypertension: Secondary | ICD-10-CM | POA: Diagnosis not present

## 2017-12-06 DIAGNOSIS — E785 Hyperlipidemia, unspecified: Secondary | ICD-10-CM | POA: Diagnosis not present

## 2017-12-06 DIAGNOSIS — E119 Type 2 diabetes mellitus without complications: Secondary | ICD-10-CM | POA: Diagnosis not present

## 2017-12-06 DIAGNOSIS — K219 Gastro-esophageal reflux disease without esophagitis: Secondary | ICD-10-CM | POA: Diagnosis not present

## 2018-01-02 DIAGNOSIS — E119 Type 2 diabetes mellitus without complications: Secondary | ICD-10-CM | POA: Diagnosis not present

## 2018-01-02 DIAGNOSIS — I10 Essential (primary) hypertension: Secondary | ICD-10-CM | POA: Diagnosis not present

## 2018-01-02 DIAGNOSIS — M545 Low back pain: Secondary | ICD-10-CM | POA: Diagnosis not present

## 2018-01-02 DIAGNOSIS — E785 Hyperlipidemia, unspecified: Secondary | ICD-10-CM | POA: Diagnosis not present

## 2018-01-12 DIAGNOSIS — F1120 Opioid dependence, uncomplicated: Secondary | ICD-10-CM | POA: Diagnosis not present

## 2018-01-12 DIAGNOSIS — G8929 Other chronic pain: Secondary | ICD-10-CM | POA: Diagnosis not present

## 2018-01-12 DIAGNOSIS — Z79891 Long term (current) use of opiate analgesic: Secondary | ICD-10-CM | POA: Diagnosis not present

## 2018-01-12 DIAGNOSIS — M7610 Psoas tendinitis, unspecified hip: Secondary | ICD-10-CM | POA: Diagnosis not present

## 2018-01-12 DIAGNOSIS — G890 Central pain syndrome: Secondary | ICD-10-CM | POA: Diagnosis not present

## 2018-01-12 DIAGNOSIS — G894 Chronic pain syndrome: Secondary | ICD-10-CM | POA: Diagnosis not present

## 2018-01-12 DIAGNOSIS — M542 Cervicalgia: Secondary | ICD-10-CM | POA: Diagnosis not present

## 2018-01-12 DIAGNOSIS — M545 Low back pain: Secondary | ICD-10-CM | POA: Diagnosis not present

## 2018-01-24 DIAGNOSIS — M48061 Spinal stenosis, lumbar region without neurogenic claudication: Secondary | ICD-10-CM | POA: Diagnosis not present

## 2018-01-24 DIAGNOSIS — M545 Low back pain: Secondary | ICD-10-CM | POA: Diagnosis not present

## 2018-01-24 DIAGNOSIS — M5116 Intervertebral disc disorders with radiculopathy, lumbar region: Secondary | ICD-10-CM | POA: Diagnosis not present

## 2018-01-24 DIAGNOSIS — M4726 Other spondylosis with radiculopathy, lumbar region: Secondary | ICD-10-CM | POA: Diagnosis not present

## 2018-01-24 DIAGNOSIS — S335XXA Sprain of ligaments of lumbar spine, initial encounter: Secondary | ICD-10-CM | POA: Diagnosis not present

## 2018-01-24 DIAGNOSIS — M4327 Fusion of spine, lumbosacral region: Secondary | ICD-10-CM | POA: Diagnosis not present

## 2018-01-24 DIAGNOSIS — M5136 Other intervertebral disc degeneration, lumbar region: Secondary | ICD-10-CM | POA: Diagnosis not present

## 2018-01-26 ENCOUNTER — Inpatient Hospital Stay (HOSPITAL_COMMUNITY)
Admission: EM | Admit: 2018-01-26 | Discharge: 2018-01-30 | DRG: 872 | Disposition: A | Discharge status: Home Health | Payer: Federal, State, Local not specified - PPO | Attending: Internal Medicine | Admitting: Internal Medicine

## 2018-01-26 ENCOUNTER — Encounter (HOSPITAL_COMMUNITY): Payer: Self-pay | Admitting: Emergency Medicine

## 2018-01-26 DIAGNOSIS — Z6831 Body mass index (BMI) 31.0-31.9, adult: Secondary | ICD-10-CM | POA: Diagnosis not present

## 2018-01-26 DIAGNOSIS — R7881 Bacteremia: Secondary | ICD-10-CM | POA: Diagnosis not present

## 2018-01-26 DIAGNOSIS — D649 Anemia, unspecified: Secondary | ICD-10-CM | POA: Diagnosis present

## 2018-01-26 DIAGNOSIS — M4640 Discitis, unspecified, site unspecified: Secondary | ICD-10-CM

## 2018-01-26 DIAGNOSIS — M4646 Discitis, unspecified, lumbar region: Secondary | ICD-10-CM | POA: Diagnosis present

## 2018-01-26 DIAGNOSIS — Z7982 Long term (current) use of aspirin: Secondary | ICD-10-CM

## 2018-01-26 DIAGNOSIS — R7982 Elevated C-reactive protein (CRP): Secondary | ICD-10-CM | POA: Diagnosis present

## 2018-01-26 DIAGNOSIS — Z833 Family history of diabetes mellitus: Secondary | ICD-10-CM

## 2018-01-26 DIAGNOSIS — R011 Cardiac murmur, unspecified: Secondary | ICD-10-CM | POA: Diagnosis present

## 2018-01-26 DIAGNOSIS — M4647 Discitis, unspecified, lumbosacral region: Secondary | ICD-10-CM | POA: Diagnosis present

## 2018-01-26 DIAGNOSIS — Z79899 Other long term (current) drug therapy: Secondary | ICD-10-CM

## 2018-01-26 DIAGNOSIS — M4626 Osteomyelitis of vertebra, lumbar region: Secondary | ICD-10-CM | POA: Diagnosis present

## 2018-01-26 DIAGNOSIS — M4620 Osteomyelitis of vertebra, site unspecified: Secondary | ICD-10-CM | POA: Diagnosis not present

## 2018-01-26 DIAGNOSIS — M542 Cervicalgia: Secondary | ICD-10-CM | POA: Diagnosis not present

## 2018-01-26 DIAGNOSIS — M869 Osteomyelitis, unspecified: Secondary | ICD-10-CM | POA: Diagnosis not present

## 2018-01-26 DIAGNOSIS — I159 Secondary hypertension, unspecified: Secondary | ICD-10-CM | POA: Diagnosis not present

## 2018-01-26 DIAGNOSIS — G8929 Other chronic pain: Secondary | ICD-10-CM | POA: Diagnosis present

## 2018-01-26 DIAGNOSIS — E669 Obesity, unspecified: Secondary | ICD-10-CM | POA: Diagnosis present

## 2018-01-26 DIAGNOSIS — Z23 Encounter for immunization: Secondary | ICD-10-CM | POA: Diagnosis not present

## 2018-01-26 DIAGNOSIS — D696 Thrombocytopenia, unspecified: Secondary | ICD-10-CM | POA: Diagnosis present

## 2018-01-26 DIAGNOSIS — I421 Obstructive hypertrophic cardiomyopathy: Secondary | ICD-10-CM | POA: Diagnosis present

## 2018-01-26 DIAGNOSIS — Z791 Long term (current) use of non-steroidal anti-inflammatories (NSAID): Secondary | ICD-10-CM

## 2018-01-26 DIAGNOSIS — A409 Streptococcal sepsis, unspecified: Secondary | ICD-10-CM | POA: Diagnosis not present

## 2018-01-26 DIAGNOSIS — E119 Type 2 diabetes mellitus without complications: Secondary | ICD-10-CM

## 2018-01-26 DIAGNOSIS — E118 Type 2 diabetes mellitus with unspecified complications: Secondary | ICD-10-CM | POA: Diagnosis not present

## 2018-01-26 DIAGNOSIS — R059 Cough, unspecified: Secondary | ICD-10-CM

## 2018-01-26 DIAGNOSIS — E785 Hyperlipidemia, unspecified: Secondary | ICD-10-CM | POA: Diagnosis present

## 2018-01-26 DIAGNOSIS — Z8249 Family history of ischemic heart disease and other diseases of the circulatory system: Secondary | ICD-10-CM | POA: Diagnosis not present

## 2018-01-26 DIAGNOSIS — E1169 Type 2 diabetes mellitus with other specified complication: Secondary | ICD-10-CM | POA: Diagnosis present

## 2018-01-26 DIAGNOSIS — R05 Cough: Secondary | ICD-10-CM

## 2018-01-26 DIAGNOSIS — I10 Essential (primary) hypertension: Secondary | ICD-10-CM | POA: Diagnosis not present

## 2018-01-26 DIAGNOSIS — Z79891 Long term (current) use of opiate analgesic: Secondary | ICD-10-CM

## 2018-01-26 DIAGNOSIS — M7610 Psoas tendinitis, unspecified hip: Secondary | ICD-10-CM | POA: Diagnosis not present

## 2018-01-26 DIAGNOSIS — M545 Low back pain: Secondary | ICD-10-CM | POA: Diagnosis not present

## 2018-01-26 DIAGNOSIS — D61818 Other pancytopenia: Secondary | ICD-10-CM | POA: Diagnosis present

## 2018-01-26 DIAGNOSIS — I340 Nonrheumatic mitral (valve) insufficiency: Secondary | ICD-10-CM | POA: Diagnosis not present

## 2018-01-26 DIAGNOSIS — A419 Sepsis, unspecified organism: Secondary | ICD-10-CM | POA: Diagnosis present

## 2018-01-26 DIAGNOSIS — B955 Unspecified streptococcus as the cause of diseases classified elsewhere: Secondary | ICD-10-CM | POA: Diagnosis not present

## 2018-01-26 DIAGNOSIS — G890 Central pain syndrome: Secondary | ICD-10-CM | POA: Diagnosis not present

## 2018-01-26 DIAGNOSIS — Z7984 Long term (current) use of oral hypoglycemic drugs: Secondary | ICD-10-CM

## 2018-01-26 DIAGNOSIS — E1140 Type 2 diabetes mellitus with diabetic neuropathy, unspecified: Secondary | ICD-10-CM

## 2018-01-26 LAB — BASIC METABOLIC PANEL
Anion gap: 9 (ref 5–15)
BUN: 12 mg/dL (ref 6–20)
CO2: 28 mmol/L (ref 22–32)
Calcium: 9.1 mg/dL (ref 8.9–10.3)
Chloride: 95 mmol/L — ABNORMAL LOW (ref 101–111)
Creatinine, Ser: 1.06 mg/dL — ABNORMAL HIGH (ref 0.44–1.00)
GFR calc Af Amer: >60 mL/min (ref >60)
GFR calc non Af Amer: 60 mL/min — ABNORMAL LOW (ref >60)
Glucose, Bld: 237 mg/dL — ABNORMAL HIGH (ref 65–99)
Potassium: 4 mmol/L (ref 3.5–5.1)
Sodium: 132 mmol/L — ABNORMAL LOW (ref 135–145)

## 2018-01-26 LAB — CBC
HCT: 29.9 % — ABNORMAL LOW (ref 36.0–46.0)
Hemoglobin: 10 g/dL — ABNORMAL LOW (ref 12.0–15.0)
MCH: 27.1 pg (ref 26.0–34.0)
MCHC: 33.4 g/dL (ref 30.0–36.0)
MCV: 81 fL (ref 78.0–100.0)
Platelets: 138 10*3/uL — ABNORMAL LOW (ref 150–400)
RBC: 3.69 MIL/uL — ABNORMAL LOW (ref 3.87–5.11)
RDW: 13.8 % (ref 11.5–15.5)
WBC: 8.8 10*3/uL (ref 4.0–10.5)

## 2018-01-26 LAB — I-STAT CG4 LACTIC ACID, ED: Lactic Acid, Venous: 1.67 mmol/L (ref 0.5–1.9)

## 2018-01-26 NOTE — ED Notes (Signed)
Results reviewed.  No changes in acuity at this time 

## 2018-01-26 NOTE — ED Triage Notes (Signed)
Pt reports back pain X several weeks, was seen at [facility] earlier this week and had MRI Lumbar Spine. "FINDINGS concerning for early disc space infection and osteomyelitis in the left lateral aspect of the L5-S1 disc extending into the superior aspect of the S1 vertebral body" Pt sent here for further eval/admission

## 2018-01-27 ENCOUNTER — Inpatient Hospital Stay (HOSPITAL_COMMUNITY): Payer: Federal, State, Local not specified - PPO

## 2018-01-27 ENCOUNTER — Other Ambulatory Visit: Payer: Self-pay

## 2018-01-27 DIAGNOSIS — R05 Cough: Secondary | ICD-10-CM | POA: Diagnosis not present

## 2018-01-27 DIAGNOSIS — I159 Secondary hypertension, unspecified: Secondary | ICD-10-CM

## 2018-01-27 DIAGNOSIS — R7881 Bacteremia: Secondary | ICD-10-CM

## 2018-01-27 DIAGNOSIS — M4646 Discitis, unspecified, lumbar region: Secondary | ICD-10-CM

## 2018-01-27 DIAGNOSIS — Z833 Family history of diabetes mellitus: Secondary | ICD-10-CM | POA: Diagnosis not present

## 2018-01-27 DIAGNOSIS — A419 Sepsis, unspecified organism: Secondary | ICD-10-CM | POA: Diagnosis present

## 2018-01-27 DIAGNOSIS — E118 Type 2 diabetes mellitus with unspecified complications: Secondary | ICD-10-CM

## 2018-01-27 DIAGNOSIS — M4640 Discitis, unspecified, site unspecified: Secondary | ICD-10-CM | POA: Diagnosis not present

## 2018-01-27 DIAGNOSIS — R011 Cardiac murmur, unspecified: Secondary | ICD-10-CM | POA: Diagnosis present

## 2018-01-27 DIAGNOSIS — B955 Unspecified streptococcus as the cause of diseases classified elsewhere: Secondary | ICD-10-CM | POA: Diagnosis not present

## 2018-01-27 DIAGNOSIS — M4620 Osteomyelitis of vertebra, site unspecified: Secondary | ICD-10-CM

## 2018-01-27 DIAGNOSIS — E1169 Type 2 diabetes mellitus with other specified complication: Secondary | ICD-10-CM | POA: Diagnosis present

## 2018-01-27 DIAGNOSIS — I10 Essential (primary) hypertension: Secondary | ICD-10-CM | POA: Diagnosis not present

## 2018-01-27 DIAGNOSIS — M545 Low back pain: Secondary | ICD-10-CM | POA: Diagnosis present

## 2018-01-27 DIAGNOSIS — D696 Thrombocytopenia, unspecified: Secondary | ICD-10-CM

## 2018-01-27 DIAGNOSIS — Z8249 Family history of ischemic heart disease and other diseases of the circulatory system: Secondary | ICD-10-CM | POA: Diagnosis not present

## 2018-01-27 DIAGNOSIS — M4626 Osteomyelitis of vertebra, lumbar region: Secondary | ICD-10-CM | POA: Diagnosis not present

## 2018-01-27 DIAGNOSIS — E785 Hyperlipidemia, unspecified: Secondary | ICD-10-CM | POA: Diagnosis present

## 2018-01-27 DIAGNOSIS — Z79891 Long term (current) use of opiate analgesic: Secondary | ICD-10-CM | POA: Diagnosis not present

## 2018-01-27 DIAGNOSIS — Z6831 Body mass index (BMI) 31.0-31.9, adult: Secondary | ICD-10-CM | POA: Diagnosis not present

## 2018-01-27 DIAGNOSIS — Z23 Encounter for immunization: Secondary | ICD-10-CM | POA: Diagnosis not present

## 2018-01-27 DIAGNOSIS — I421 Obstructive hypertrophic cardiomyopathy: Secondary | ICD-10-CM | POA: Diagnosis present

## 2018-01-27 DIAGNOSIS — G8929 Other chronic pain: Secondary | ICD-10-CM | POA: Diagnosis present

## 2018-01-27 DIAGNOSIS — Z79899 Other long term (current) drug therapy: Secondary | ICD-10-CM | POA: Diagnosis not present

## 2018-01-27 DIAGNOSIS — I340 Nonrheumatic mitral (valve) insufficiency: Secondary | ICD-10-CM | POA: Diagnosis not present

## 2018-01-27 DIAGNOSIS — Z7982 Long term (current) use of aspirin: Secondary | ICD-10-CM | POA: Diagnosis not present

## 2018-01-27 DIAGNOSIS — D61818 Other pancytopenia: Secondary | ICD-10-CM | POA: Diagnosis present

## 2018-01-27 DIAGNOSIS — Z7984 Long term (current) use of oral hypoglycemic drugs: Secondary | ICD-10-CM | POA: Diagnosis not present

## 2018-01-27 DIAGNOSIS — M4647 Discitis, unspecified, lumbosacral region: Secondary | ICD-10-CM | POA: Diagnosis present

## 2018-01-27 DIAGNOSIS — E669 Obesity, unspecified: Secondary | ICD-10-CM | POA: Diagnosis present

## 2018-01-27 DIAGNOSIS — D649 Anemia, unspecified: Secondary | ICD-10-CM | POA: Diagnosis present

## 2018-01-27 DIAGNOSIS — Z791 Long term (current) use of non-steroidal anti-inflammatories (NSAID): Secondary | ICD-10-CM | POA: Diagnosis not present

## 2018-01-27 DIAGNOSIS — E119 Type 2 diabetes mellitus without complications: Secondary | ICD-10-CM | POA: Diagnosis not present

## 2018-01-27 DIAGNOSIS — R7982 Elevated C-reactive protein (CRP): Secondary | ICD-10-CM | POA: Diagnosis present

## 2018-01-27 DIAGNOSIS — A409 Streptococcal sepsis, unspecified: Secondary | ICD-10-CM | POA: Diagnosis present

## 2018-01-27 LAB — BLOOD CULTURE ID PANEL (REFLEXED)

## 2018-01-27 LAB — BASIC METABOLIC PANEL
ANION GAP: 6 (ref 5–15)
BUN: 7 mg/dL (ref 6–20)
CALCIUM: 7.7 mg/dL — AB (ref 8.9–10.3)
CO2: 23 mmol/L (ref 22–32)
Chloride: 107 mmol/L (ref 101–111)
Creatinine, Ser: 0.88 mg/dL (ref 0.44–1.00)
GFR calc Af Amer: >60 mL/min (ref >60)
GLUCOSE: 150 mg/dL — AB (ref 65–99)
POTASSIUM: 4.1 mmol/L (ref 3.5–5.1)
Sodium: 136 mmol/L (ref 135–145)

## 2018-01-27 LAB — DIFFERENTIAL
ABS IMMATURE GRANULOCYTES: 0 10*3/uL (ref 0.0–0.1)
BASOS ABS: 0 10*3/uL (ref 0.0–0.1)
Basophils Relative: 0 %
Eosinophils Absolute: 0 10*3/uL (ref 0.0–0.7)
Eosinophils Relative: 0 %
Immature Granulocytes: 0 %
LYMPHS PCT: 20 %
Lymphs Abs: 1.5 10*3/uL (ref 0.7–4.0)
MONO ABS: 0.8 10*3/uL (ref 0.1–1.0)
Monocytes Relative: 11 %
NEUTROS ABS: 5.1 10*3/uL (ref 1.7–7.7)
Neutrophils Relative %: 69 %

## 2018-01-27 LAB — GLUCOSE, CAPILLARY
Glucose-Capillary: 97 mg/dL (ref 65–99)
Glucose-Capillary: 223 mg/dL — ABNORMAL HIGH (ref 65–99)

## 2018-01-27 LAB — CBG MONITORING, ED
GLUCOSE-CAPILLARY: 143 mg/dL — AB (ref 65–99)
GLUCOSE-CAPILLARY: 100 mg/dL — AB (ref 65–99)

## 2018-01-27 LAB — ECHOCARDIOGRAM COMPLETE
Height: 63 in
Weight: 2880 oz

## 2018-01-27 LAB — CBC
HEMATOCRIT: 23.6 % — AB (ref 36.0–46.0)
Hemoglobin: 7.9 g/dL — ABNORMAL LOW (ref 12.0–15.0)
MCH: 26.7 pg (ref 26.0–34.0)
MCHC: 33.5 g/dL (ref 30.0–36.0)
MCV: 79.7 fL (ref 78.0–100.0)
PLATELETS: 122 10*3/uL — AB (ref 150–400)
RBC: 2.96 MIL/uL — ABNORMAL LOW (ref 3.87–5.11)
RDW: 13.8 % (ref 11.5–15.5)
WBC: 7 10*3/uL (ref 4.0–10.5)

## 2018-01-27 LAB — HIV ANTIBODY (ROUTINE TESTING W REFLEX): HIV Screen 4th Generation wRfx: NONREACTIVE

## 2018-01-27 LAB — SEDIMENTATION RATE: Sed Rate: 53 mm/hr — ABNORMAL HIGH (ref 0–22)

## 2018-01-27 LAB — RETICULOCYTES
RBC.: 3.11 MIL/uL — AB (ref 3.87–5.11)
Retic Count, Absolute: 31.1 10*3/uL (ref 19.0–186.0)
Retic Ct Pct: 1 % (ref 0.4–3.1)

## 2018-01-27 LAB — IRON AND TIBC
IRON: 19 ug/dL — AB (ref 28–170)
SATURATION RATIOS: 10 % — AB (ref 10.4–31.8)
TIBC: 196 ug/dL — ABNORMAL LOW (ref 250–450)
UIBC: 177 ug/dL

## 2018-01-27 LAB — FERRITIN: Ferritin: 218 ng/mL (ref 11–307)

## 2018-01-27 LAB — PROTIME-INR
INR: 1.39
Prothrombin Time: 16.9 seconds — ABNORMAL HIGH (ref 11.4–15.2)

## 2018-01-27 LAB — C-REACTIVE PROTEIN: CRP: 9.4 mg/dL — ABNORMAL HIGH (ref <1.0)

## 2018-01-27 LAB — VITAMIN B12: VITAMIN B 12: 908 pg/mL (ref 180–914)

## 2018-01-27 LAB — FOLATE: FOLATE: 12.5 ng/mL (ref >5.9)

## 2018-01-27 LAB — I-STAT CG4 LACTIC ACID, ED: Lactic Acid, Venous: 1 mmol/L (ref 0.5–1.9)

## 2018-01-27 MED ORDER — ASPIRIN EC 81 MG PO TBEC
81.0000 mg | DELAYED_RELEASE_TABLET | Freq: Every day | ORAL | Status: DC
  Administered 2018-01-28 – 2018-01-30 (×3): 81 mg via ORAL
  Filled 2018-01-27 (×5): qty 1

## 2018-01-27 MED ORDER — VANCOMYCIN HCL IN DEXTROSE 1-5 GM/200ML-% IV SOLN
1000.0000 mg | Freq: Once | INTRAVENOUS | Status: AC
  Administered 2018-01-27: 1000 mg via INTRAVENOUS
  Filled 2018-01-27: qty 200

## 2018-01-27 MED ORDER — SODIUM CHLORIDE 0.9 % IV BOLUS
1000.0000 mL | Freq: Once | INTRAVENOUS | Status: AC
  Administered 2018-01-27: 1000 mL via INTRAVENOUS

## 2018-01-27 MED ORDER — ACETAMINOPHEN 500 MG PO TABS
1000.0000 mg | ORAL_TABLET | Freq: Once | ORAL | Status: AC
  Administered 2018-01-27: 1000 mg via ORAL
  Filled 2018-01-27: qty 2

## 2018-01-27 MED ORDER — SODIUM CHLORIDE 0.9 % IV SOLN
INTRAVENOUS | Status: DC
  Administered 2018-01-27 – 2018-01-29 (×3): via INTRAVENOUS

## 2018-01-27 MED ORDER — PIPERACILLIN-TAZOBACTAM 3.375 G IVPB: 3.3750 g | Freq: Three times a day (TID) | INTRAVENOUS | Status: DC

## 2018-01-27 MED ORDER — AMLODIPINE BESYLATE 10 MG PO TABS
10.0000 mg | ORAL_TABLET | Freq: Every day | ORAL | Status: DC
  Administered 2018-01-28 – 2018-01-30 (×3): 10 mg via ORAL
  Filled 2018-01-27 (×5): qty 1

## 2018-01-27 MED ORDER — VANCOMYCIN HCL IN DEXTROSE 1-5 GM/200ML-% IV SOLN: 1000.0000 mg | Freq: Two times a day (BID) | INTRAVENOUS | Status: DC

## 2018-01-27 MED ORDER — METOPROLOL SUCCINATE ER 25 MG PO TB24
12.5000 mg | ORAL_TABLET | Freq: Every day | ORAL | Status: DC
  Administered 2018-01-28 – 2018-01-30 (×3): 12.5 mg via ORAL
  Filled 2018-01-27 (×5): qty 1

## 2018-01-27 MED ORDER — PREGABALIN 25 MG PO CAPS
50.0000 mg | ORAL_CAPSULE | Freq: Three times a day (TID) | ORAL | Status: DC
  Administered 2018-01-27 – 2018-01-30 (×10): 50 mg via ORAL
  Filled 2018-01-27 (×13): qty 2

## 2018-01-27 MED ORDER — IRBESARTAN 300 MG PO TABS
300.0000 mg | ORAL_TABLET | Freq: Every day | ORAL | Status: DC
  Administered 2018-01-28 – 2018-01-30 (×3): 300 mg via ORAL
  Filled 2018-01-27 (×5): qty 1

## 2018-01-27 MED ORDER — ROSUVASTATIN CALCIUM 20 MG PO TABS
20.0000 mg | ORAL_TABLET | Freq: Every day | ORAL | Status: DC
  Administered 2018-01-28 – 2018-01-30 (×3): 20 mg via ORAL
  Filled 2018-01-27 (×4): qty 1

## 2018-01-27 MED ORDER — INSULIN ASPART 100 UNIT/ML ~~LOC~~ SOLN
0.0000 [IU] | Freq: Three times a day (TID) | SUBCUTANEOUS | Status: DC
  Administered 2018-01-27 – 2018-01-28 (×2): 2 [IU] via SUBCUTANEOUS
  Administered 2018-01-28: 5 [IU] via SUBCUTANEOUS
  Administered 2018-01-29: 8 [IU] via SUBCUTANEOUS
  Administered 2018-01-29 – 2018-01-30 (×2): 2 [IU] via SUBCUTANEOUS
  Filled 2018-01-27: qty 1

## 2018-01-27 MED ORDER — ACETAMINOPHEN 325 MG PO TABS: 650.0000 mg | ORAL_TABLET | Freq: Four times a day (QID) | ORAL | Status: DC | PRN

## 2018-01-27 MED ORDER — OXYCODONE-ACETAMINOPHEN 5-325 MG PO TABS
1.0000 | ORAL_TABLET | Freq: Three times a day (TID) | ORAL | Status: DC
  Administered 2018-01-27 – 2018-01-30 (×10): 1 via ORAL
  Filled 2018-01-27 (×11): qty 1

## 2018-01-27 MED ORDER — PIPERACILLIN-TAZOBACTAM 3.375 G IVPB 30 MIN
3.3750 g | Freq: Once | INTRAVENOUS | Status: AC
  Administered 2018-01-27: 3.375 g via INTRAVENOUS
  Filled 2018-01-27: qty 50

## 2018-01-27 MED ORDER — METHOCARBAMOL 500 MG PO TABS: 500.0000 mg | ORAL_TABLET | Freq: Two times a day (BID) | ORAL | Status: DC | PRN

## 2018-01-27 MED ORDER — ONDANSETRON HCL 4 MG/2ML IJ SOLN: 4.0000 mg | Freq: Four times a day (QID) | INTRAMUSCULAR | Status: DC | PRN

## 2018-01-27 MED ORDER — OXYCODONE-ACETAMINOPHEN 5-325 MG PO TABS
1.0000 | ORAL_TABLET | Freq: Three times a day (TID) | ORAL | Status: DC
  Administered 2018-01-27: 1 via ORAL
  Filled 2018-01-27 (×2): qty 1

## 2018-01-27 MED ORDER — AMLODIPINE-OLMESARTAN 10-40 MG PO TABS: 1.0000 | ORAL_TABLET | Freq: Every day | ORAL | Status: DC

## 2018-01-27 MED ORDER — PNEUMOCOCCAL VAC POLYVALENT 25 MCG/0.5ML IJ INJ
0.5000 mL | INJECTION | INTRAMUSCULAR | Status: AC
  Administered 2018-01-28: 0.5 mL via INTRAMUSCULAR
  Filled 2018-01-27: qty 0.5

## 2018-01-27 MED ORDER — SODIUM CHLORIDE 0.9 % IV BOLUS
1500.0000 mL | Freq: Once | INTRAVENOUS | Status: AC
  Administered 2018-01-27: 1500 mL via INTRAVENOUS

## 2018-01-27 MED ORDER — ENOXAPARIN SODIUM 40 MG/0.4ML ~~LOC~~ SOLN: 40.0000 mg | Freq: Every day | SUBCUTANEOUS | Status: DC

## 2018-01-27 MED ORDER — SODIUM CHLORIDE 0.9 % IV SOLN
2.0000 g | INTRAVENOUS | Status: DC
  Administered 2018-01-27 – 2018-01-29 (×3): 2 g via INTRAVENOUS
  Filled 2018-01-27 (×3): qty 20

## 2018-01-27 MED ORDER — ENOXAPARIN SODIUM 40 MG/0.4ML ~~LOC~~ SOLN
40.0000 mg | SUBCUTANEOUS | Status: DC
  Administered 2018-01-27 – 2018-01-30 (×4): 40 mg via SUBCUTANEOUS
  Filled 2018-01-27 (×5): qty 0.4

## 2018-01-27 MED ORDER — ACETAMINOPHEN 650 MG RE SUPP: 650.0000 mg | Freq: Four times a day (QID) | RECTAL | Status: DC | PRN

## 2018-01-27 MED ORDER — ALBUTEROL SULFATE (2.5 MG/3ML) 0.083% IN NEBU: 2.5000 mg | INHALATION_SOLUTION | Freq: Four times a day (QID) | RESPIRATORY_TRACT | Status: DC | PRN

## 2018-01-27 MED ORDER — INSULIN ASPART 100 UNIT/ML ~~LOC~~ SOLN
0.0000 [IU] | Freq: Every day | SUBCUTANEOUS | Status: DC
  Administered 2018-01-27: 2 [IU] via SUBCUTANEOUS
  Administered 2018-01-28: 3 [IU] via SUBCUTANEOUS
  Administered 2018-01-29: 2 [IU] via SUBCUTANEOUS

## 2018-01-27 MED ORDER — ONDANSETRON HCL 4 MG PO TABS: 4.0000 mg | ORAL_TABLET | Freq: Four times a day (QID) | ORAL | Status: DC | PRN

## 2018-01-27 MED ORDER — SODIUM CHLORIDE 0.9% FLUSH: 3.0000 mL | Freq: Two times a day (BID) | INTRAVENOUS | Status: DC

## 2018-01-27 MED ORDER — OXYCODONE-ACETAMINOPHEN 10-325 MG PO TABS: 1.0000 | ORAL_TABLET | Freq: Three times a day (TID) | ORAL | Status: DC

## 2018-01-27 MED ORDER — OXYCODONE HCL 5 MG PO TABS
5.0000 mg | ORAL_TABLET | Freq: Three times a day (TID) | ORAL | Status: DC
  Administered 2018-01-27: 5 mg via ORAL
  Filled 2018-01-27 (×2): qty 1

## 2018-01-27 MED ORDER — OXYCODONE HCL 5 MG PO TABS
5.0000 mg | ORAL_TABLET | Freq: Three times a day (TID) | ORAL | Status: DC
  Administered 2018-01-27 – 2018-01-30 (×10): 5 mg via ORAL
  Filled 2018-01-27 (×11): qty 1

## 2018-01-27 NOTE — Progress Notes (Signed)
  Echocardiogram 2D Echocardiogram has been performed.  [name] 01/27/2018, 12:16 PM

## 2018-01-27 NOTE — ED Provider Notes (Signed)
[facility]ERG[facility] DEPARTMENT Provider Note   CSN: [id number] Arrival date & time: 01/26/18  1940    History   Chief Complaint Chief Complaint  Patient presents with  . Back Pain    HPI [name] is a 52 y.o. female.  52 year old female with a history of hypertension and chronic low back pain x5 years (followed by pain management) presents to the emergency department for admission after outpatient MRI revealed findings concerning for osteomyelitis and discitis.  She reports worsening abdominal pain with intermittent sharp, shooting pains since the middle of May.  She denies any recent trauma, fall, injury.  No preceding viral illness, recent travel.  No history of IV drug use.  She has had intermittent subjective fevers with chills and rigors.  She denies taking any medications for symptoms other than her prescribed pain medicine from her pain management clinic.  The patient notes some intermittent, sporadic decreased sensation from her knee distally to the top of her foot.  She has had no genital numbness, bowel or bladder incontinence.  She was advised to come to the ED by her pain management provider after review of her outpatient MRI in the clinic today.     Past Medical History:  Diagnosis Date  . Arthritis 2008   Lumbar Deg Disc Disease  . Chest pain   . HTN (hypertension)   . Migraine   . 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 Metatarsal deformity 01/01/2013  . Diabetes ([place]) 01/01/2013  . 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 DDD (degenerative disc disease), lumbar 03/03/2012  . Obesity (BMI 30.0-34.9) 03/03/2012  . HTN (hypertension)   . Migraine     History reviewed. No pertinent surgical history.   OB History   None      Home Medications    Prior to Admission medications   Medication Sig Start Date End Date Taking? Authorizing Provider  amLODipine-olmesartan (AZOR) 10-40 MG per tablet Take 1 tablet  by mouth daily.   Yes [provider]  aspirin 81 MG tablet Take 81 mg by mouth daily.     Yes [provider]  Calcium Carbonate-Vitamin D (CALCIUM-VITAMIN D) 500-200 MG-UNIT per tablet Take 1 tablet by mouth 2 (two) times daily with a meal.     Yes [provider]  cholecalciferol (VITAMIN D) 1000 UNITS tablet Take 1,000 Units by mouth daily.   Yes [provider]  meloxicam (MOBIC) 15 MG tablet Take 15 mg by mouth daily. 01/12/18  Yes [provider]  metFORMIN (GLUCOPHAGE) 500 MG tablet Take 500 mg by mouth daily.  11/22/17  Yes [provider]  methocarbamol (ROBAXIN) 500 MG tablet Take 500 mg by mouth 2 (two) times daily. 01/02/18  Yes [provider]  metoprolol succinate (TOPROL-XL) 25 MG 24 hr tablet Take 12.5 mg by mouth daily. 11/22/17  Yes [provider]  oxyCODONE-acetaminophen (PERCOCET) 10-325 MG tablet Take 1 tablet by mouth 3 (three) times daily. 01/12/18  Yes [provider]  pregabalin (LYRICA) 50 MG capsule Take 50 mg by mouth 3 (three) times daily.    Yes [provider]  rosuvastatin (CRESTOR) 20 MG tablet Take 20 mg by mouth daily. 11/22/17  Yes [provider]    Family History Family History  Problem Relation Age of Onset  . Diabetes Mother   . Hypertension Mother     Social History Social History   Tobacco Use  .  Smoking status: Never Smoker  . Smokeless tobacco: Never Used  Substance Use Topics  . Alcohol use: No  . Drug use: No     Allergies   Patient has no known allergies.   Review of Systems Review of Systems Ten systems reviewed and are negative for acute change, except as noted in the HPI.    Physical Exam Updated Vital Signs BP 125/90   Pulse (!) 108   Temp (!) 100.5 F (38.1 C)   Resp 18   Ht 5\' 3"  (1.6 m)   Wt 81.6 kg (180 lb)   LMP 05/18/2012   SpO2 100%   BMI 31.89 kg/m   Physical Exam  Constitutional: She is oriented to person,  place, and time. She appears well-developed and well-nourished. No distress.  Nontoxic appearing and in NAD  HENT:  Head: Normocephalic and atraumatic.  Eyes: Conjunctivae and EOM are normal. No scleral icterus.  Neck: Normal range of motion.  Cardiovascular: Regular rhythm and intact distal pulses.  Tachycardia ~115bpm  Pulmonary/Chest: Effort normal. No respiratory distress.  Respirations even and unlabored  Musculoskeletal: Normal range of motion.       Back:  Neurological: She is alert and oriented to person, place, and time. She exhibits normal muscle tone. Coordination normal.  GCS 15. Patient moving all extremities spontaneously. Sensation to light touch intact.  Skin: Skin is warm and dry. No rash noted. She is not diaphoretic. No erythema. No pallor.  Psychiatric: She has a normal mood and affect. Her behavior is normal.  Nursing note and vitals reviewed.    ED Treatments / Results  Labs (all labs ordered are listed, but only abnormal results are displayed) Labs Reviewed  CBC - Abnormal; Notable for the following components:      Result Value   RBC 3.69 (*)    Hemoglobin 10.0 (*)    HCT 29.9 (*)    Platelets 138 (*)    All other components within normal limits  BASIC METABOLIC PANEL - Abnormal; Notable for the following components:   Sodium 132 (*)    Chloride 95 (*)    Glucose, Bld 237 (*)    Creatinine, Ser 1.06 (*)    GFR calc non Af Amer 60 (*)    All other components within normal limits  CULTURE, BLOOD (ROUTINE X 2)  CULTURE, BLOOD (ROUTINE X 2)  DIFFERENTIAL  C-REACTIVE PROTEIN  SEDIMENTATION RATE  I-STAT CG4 LACTIC ACID, ED  I-STAT CG4 LACTIC ACID, ED    EKG None  Radiology No results found.  MRI Lumbar Spine W/W/O Contrast 01/25/18 (Per Care Everywhere) IMPRESSION:   ####CODE SIGNIFICANT REPORT#### FINDINGS concerning for early disc space infection and osteomyelitis in the left lateral aspect of the L5-S1 disc extending into the superior aspect  of the S1 vertebral body.  Electronically Signed by: Ross Marcus  Result Narrative  MRI lumbar spine without and with contrast:  INDICATION: Low back pain radiating into the legs.  TECHNIQUE: Sagittal and axial T1 and T2-weighted sequences were performed. Additional sagittal STIR images were performed. Additional sagittal and axial T1-weighted sequences were performed after intravenous injection 16 mL MultiHance.   COMPARISON: MRI lumbar spine 05/13/2016.  FINDINGS: #Vertebral bodies: No compression fracture.No abnormal enhancement. #Alignment: Normal. #Marrow signal: No significant abnormality. #Conus medullaris: Normal. Terminates at L2 with no evidence of tethering. No abnormal enhancement. #Lower thoracic segments: No significant abnormality. #Additional findings: Enlarged uterus with uterine fibroids.  #L1-2: Normal. #L2-3: Normal. #L3-4: Moderate degenerative disc disease. Mild facet  joint arthritis. #L4-5: Moderately severe degenerative disc disease. Mild facet joint arthritis. There is some thickening of the ligamentum flavum. Mild spinal stenosis. #L5-S1: There is some fluid in the central aspect of the disc. There is loss of definition of the superior endplate of S1 on the left with moderate edema in the superior aspect of the S1 vertebral body. This enhances after administration of contrast.  There is also some enhancement of the left lateral inferior aspect of the disc. There is extension of abnormal enhancing tissue into the left paraspinous region and slight edema in the adjacent soft tissues. These findings are concerning for early disc  space infection and osteomyelitis of the adjacent S1 vertebral body.    Procedures Procedures (including critical care time)  Medications Ordered in ED Medications  sodium chloride 0.9 % bolus 1,500 mL (has no administration in time range)  piperacillin-tazobactam (ZOSYN) IVPB 3.375 g (has no administration  in time range)  vancomycin (VANCOCIN) IVPB 1000 mg/200 mL premix (has no administration in time range)  sodium chloride 0.9 % bolus 1,000 mL (1,000 mLs Intravenous New Bag/Given 01/27/18 0041)  acetaminophen (TYLENOL) tablet 1,000 mg (1,000 mg Oral Given 01/27/18 0041)    CRITICAL CARE Performed by: Antonietta Breach   Total critical care time: 35 minutes  Critical care time was exclusive of separately billable procedures and treating other patients.  Critical care was necessary to treat or prevent imminent or life-threatening deterioration.  Critical care was time spent personally by me on the following activities: development of treatment plan with patient and/or surrogate as well as nursing, discussions with consultants, evaluation of patient's response to treatment, examination of patient, obtaining history from patient or surrogate, ordering and performing treatments and interventions, ordering and review of laboratory studies, ordering and review of radiographic studies, pulse oximetry and re-evaluation of patient's condition.   Initial Impression / Assessment and Plan / ED Course  I have reviewed the triage vital signs and the nursing notes.  Pertinent labs & imaging results that were available during my care of the patient were reviewed by me and considered in my medical decision making (see chart for details).     53 year old female presents to the emergency department at the recommendation of her outpatient doctors following completion of MRI lumbar spine w/w/o contrast. This revealed concern for osteomyelitis and discitis.  Patient reports worsening low back pain over the past 3 weeks.  She has no red flags or signs concerning for cauda equina.  Patient with low-grade fever on arrival to the emergency department.  Tachycardia noted to 115bpm during exam.  Laboratory work-up is reassuring without leukocytosis or elevated lactate level.  The patient has blood cultures pending.  CRP and  sedimentation rate also obtained.  Plan for admission to [facility] for continued management.  Case was discussed with Dr. Arnoldo Morale of neurosurgery.  Given fever and tachycardia, he does believe that antibiotic initiation would be beneficial; however thinks any future consultations or questions with respect to course of treatment may be better addressed by ID as there is no current need for emergent surgical intervention.   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

## 2018-01-27 NOTE — ED Notes (Signed)
Rocephin ordered and given prior to blood culture order being placed.

## 2018-01-27 NOTE — Progress Notes (Signed)
PHARMACY - PHYSICIAN COMMUNICATION CRITICAL VALUE ALERT - BLOOD CULTURE IDENTIFICATION (BCID)  [name] is an 52 y.o. female who presented to [facility] on 01/26/2018 with a chief complaint of back pain  Assessment:  63 YOF with MRI concerning for osteo/discitis. Now with 2/2 BCx growing Strep species. ID already consulted.   Name of physician (or Provider) Contacted: Ghimire  Current antibiotics: Rocephin 2g IV every 24 hours  Changes to prescribed antibiotics recommended:  Patient is on recommended antibiotics - No changes needed  Results for orders placed or performed during the hospital encounter of 01/26/18  Blood Culture ID Panel (Reflexed) (Collected: 01/26/2018  8:45 PM)  Result Value Ref Range   Enterococcus species NOT DETECTED NOT DETECTED   Listeria monocytogenes NOT DETECTED NOT DETECTED   Staphylococcus species NOT DETECTED NOT DETECTED   Staphylococcus aureus NOT DETECTED NOT DETECTED   Streptococcus species DETECTED (A) NOT DETECTED   Streptococcus agalactiae NOT DETECTED NOT DETECTED   Streptococcus pneumoniae NOT DETECTED NOT DETECTED   Streptococcus pyogenes NOT DETECTED NOT DETECTED   Acinetobacter baumannii NOT DETECTED NOT DETECTED   Enterobacteriaceae species NOT DETECTED NOT DETECTED   Enterobacter cloacae complex NOT DETECTED NOT DETECTED   Escherichia coli NOT DETECTED NOT DETECTED   Klebsiella oxytoca NOT DETECTED NOT DETECTED   Klebsiella pneumoniae NOT DETECTED NOT DETECTED   Proteus species NOT DETECTED NOT DETECTED   Serratia marcescens NOT DETECTED NOT DETECTED   Haemophilus influenzae NOT DETECTED NOT DETECTED   Neisseria meningitidis NOT DETECTED NOT DETECTED   Pseudomonas aeruginosa NOT DETECTED NOT DETECTED   Candida albicans NOT DETECTED NOT DETECTED   Candida glabrata NOT DETECTED NOT DETECTED   Candida krusei NOT DETECTED NOT DETECTED   Candida parapsilosis NOT DETECTED NOT DETECTED   Candida tropicalis NOT DETECTED NOT DETECTED     Lawson Radar 01/27/2018  3:33 PM

## 2018-01-27 NOTE — Consult Note (Signed)
[facility] for Infectious Disease    Date of Admission:  01/26/2018     Total days of antibiotics                Reason for Consult: Discitis/Osteomyelitis   Referring Provider: Ghimire Primary Care Provider: Benito Mccreedy, MD   Assessment/Plan:  [name] is a 52 y/o female with previous history of thymic cyst, migraines, hypertension, heart murmur and degenerative disc disease presenting to the ED following outpatient MRI findings consistent with discitis/osteomyelitis of her lumbar spine. She has had back pain for greater than 5 years now and managed outpatient by pain management providers.  Blood cultures were drawn upon admission and she received a dose of vancomycin and piperacillin-tazobactam while in the ED. Neurosurgery recommends antimicrobial therapy with no surgical interventions at the present time. IR initially consulted, however based on images does not believe that this is discitis. She reports being febrile at home (subjective) and having a temperature of 100.5 in ED. Blood cultures initially positive for Streptococcus species (likely Strep Viridans).  1. Start Ceftriaxone to cover  For bacteremia with likely source of discitis/osteomyelitis. Will likely require a prolonged course of antibiotics of at least 6 weeks. 2. Continue to monitor cultures.  3. No need for aspiration at this time.    Principal Problem:   Sepsis ([place]) Active Problems:   HTN (hypertension)   Diabetes ([place])   Cardiac murmur   Discitis of lumbar region   Thrombocytopenia ([place])   . amLODipine  10 mg Oral Daily   And  . irbesartan  300 mg Oral Daily  . aspirin EC  81 mg Oral Daily  . insulin aspart  0-15 Units Subcutaneous TID WC  . insulin aspart  0-5 Units Subcutaneous QHS  . metoprolol succinate  12.5 mg Oral Daily  . oxyCODONE-acetaminophen  1 tablet Oral Q8H   And  . oxyCODONE  5 mg Oral Q8H  . pregabalin  50 mg Oral TID  . rosuvastatin  20 mg Oral Daily  . sodium  chloride flush  3 mL Intravenous Q12H     HPI: [name] is a 52 y.o. female with previous medical history of thymic cyst, migraines, hypertension and degenerative disc disease presenting the ED on 6/7 with back pain after outpatient MRI by her Pain Management Providers showing increased fluid int he central aspect of the L5-S1 disc with moderate edema concerning for early disc space infection and osteomyelitis. In the ED described intermittent subjective fevers with chills and rigors.   Temperature in the ED was 100.5 with tachycardia with otherwise normal vital signs. No leukocytosis with normal lactate. She was empirically started on vancomycin and zoysn this morning. Blood cultures were drawn last evening and are pending. ESR was 53 and CRP was 9.4.  Neurosurgery recommends cultures and antibiotics with no surgical intervention at this time. IR consulted for possible disc aspiration and per note Dr. Estanislado [name] is not convinced this is discitis.   [name] has had chronic back pain for the past 5 years with idiopathic onset with no history of trauma or injury. Outpatient she is followed by Pain Management and has been experiencing worsening back pain over the past couple of months and was sent for an MRI and noted to have possible discitis/osteomyelitis. Describes having fever (subjective) at home. Pain will radiate down her right leg. Denis saddle anesthesia or changes to bowel function/control.    Review of Systems: Review of Systems  Constitutional: Positive for  chills and fever.  Respiratory: Negative for cough, shortness of breath and wheezing.   Cardiovascular: Negative for chest pain and leg swelling.  Gastrointestinal: Negative for constipation, diarrhea, nausea and vomiting.  Musculoskeletal: Positive for back pain.  Skin: Negative for rash.     Past Medical History:  Diagnosis Date  . Arthritis 2008   Lumbar Deg Disc Disease  . Chest pain   . HTN (hypertension)   .  Migraine   . 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 Smoking status: Never Smoker  . Smokeless tobacco: Never Used  Substance Use Topics  . Alcohol use: No  . Drug use: No    Family History  Problem Relation Age of Onset  . Diabetes Mother   . Hypertension Mother     No Known Allergies  OBJECTIVE: Blood pressure 113/73, pulse 78, temperature (!) 100.5 F (38.1 C), resp. rate 20, height '5\' 3"'$  (1.6 m), weight 180 lb (81.6 kg), last menstrual period 05/18/2012, SpO2 99 %.  Physical Exam  Constitutional: She is oriented to person, place, and time. She appears well-developed and well-nourished. No distress.  Cardiovascular: Normal rate, regular rhythm and intact distal pulses.  Murmur heard. Pulmonary/Chest: Effort normal and breath sounds normal.  Musculoskeletal:  Low back with no obvious deformity, discoloration or edema. There is tenderness of the sacroiliac joint and lumbar spine. Distal pulses, sensation and muscle strength is intact.  Neurological: She is alert and oriented to person, place, and time.  Skin: Skin is warm and dry.  Psychiatric: She has a normal mood and affect. Her behavior is normal. Judgment and thought content normal.  Nursing note and vitals reviewed.   Lab Results Lab Results  Component Value Date   WBC 7.0 01/27/2018   HGB 7.9 (L) 01/27/2018   HCT 23.6 (L) 01/27/2018   MCV 79.7 01/27/2018   PLT 122 (L) 01/27/2018    Lab Results  Component Value Date   CREATININE 0.88 01/27/2018   BUN 7 01/27/2018   NA 136 01/27/2018   K 4.1 01/27/2018   CL 107 01/27/2018   CO2 23 01/27/2018    Lab Results  Component Value Date   ALT 14 08/01/2013   AST 17 08/01/2013   ALKPHOS 64 08/01/2013   BILITOT 0.6 08/01/2013     Microbiology: No results found for this or any previous visit (from the past 240 hour(s)).   Vanessa Piedra, NP [facility] [phone]  Pager  01/27/2018  10:30 AM

## 2018-01-27 NOTE — ED Notes (Addendum)
Breakfast tray ordered, per order

## 2018-01-27 NOTE — Progress Notes (Signed)
IR requested by Dr. Sloan Leiter for possible image-guided lumbar 5/sacrum 1 disc aspiration due to reported discitis.  MRI reviewed by Dr. Estanislado Pandy. Per Dr. Estanislado Pandy, he is not convinced this is discitis and agrees with Dr. Arnoldo Morale (neurosurgery) that this case should be treated with cultures and the appropriate antibiotics directed by infectious disease.  Awaiting infectious disease recommendations. IR available in future if needed.  Bea Graff Louk, PA-C 01/27/2018, 9:26 AM

## 2018-01-27 NOTE — H&P (Addendum)
History and Physical    [name] BJS:[id number] DOB: 23-Feb-1966 DOA: 01/26/2018  Referring MD/NP/PA: Antonietta Breach, PA-C PCP: Benito Mccreedy, MD  Patient coming from: home  Chief Complaint: back pain  I have personally briefly reviewed patient's old medical records in [place]   HPI: [name] is a 51 y.o. female with medical history significant of HTN, heart murmur, chronic back pain, benign thymic cyst; who presents with complaints of lower back pain.  Symptoms have been present reportedly for 5 years now.  Denies any significant trauma, fall or injury to onset symptoms.  She describes sharp pain in her lower back with radiation down her leg.  Symptoms can be worsened with certain movements.  She has had associated symptoms of intermittent abdominal pain, subjective fever, intermittent cough, and chills.  She has been taking oxycodone and Lyrica with some mild relief from symptoms.  Patient has been under the care of pain management of Dr.Kwadwo Gyarteng-Dakwa of pain management who ordered the MRI of her spine on 6/4.  She had the MRI was called back after abnormal findings were noted and she was advised to come to emergency department for further evaluation.  Review of records shows MRI revealed early disc space infection and osteomyelitis in the left lateral aspect of L5-S1.  Patient denies any recent travel outside of the [place], saddle anesthesia, urinary/bowel incontinence, shortness of breath, or history of IV drug use.  Patient reports a known history of cardiac murmur which she had previously been evaluated.  ED Course: Admission into the emergency department patient was seen to be febrile up to 100.5 F, pulse 64-1 08, respirations 15-27, blood pressure 87/70 125/90, and O2 saturation maintained on room air.  The 8.8, hemoglobin 10, platelets 138, sodium 132, chloride 95, BUN 12, creatinine 1.06, ESR 53, and lactic acid 1.67.  Neurosurgery was consulted, but  recommended starting antibiotics and ID consult.  Sepsis protocol was initiated with at least 1 L normal saline IV fluids and the patient was started on empiric antibiotics of Vanco and Zosyn.   Review of Systems  Constitutional: Positive for chills and fever.  HENT: Negative for ear discharge and hearing loss.   Eyes: Negative for photophobia and pain.  Respiratory: Positive for cough. Negative for shortness of breath.   Cardiovascular: Negative for chest pain and leg swelling.  Gastrointestinal: Negative for nausea and vomiting.  Genitourinary: Negative for dysuria and hematuria.  Musculoskeletal: Positive for back pain.  Neurological: Positive for sensory change. Negative for loss of consciousness.  Psychiatric/Behavioral: Negative for substance abuse and suicidal ideas.    Past Medical History:  Diagnosis Date  . Arthritis 2008   Lumbar Deg Disc Disease  . Chest pain   . HTN (hypertension)   . Migraine   . Thymic cyst ([place])     History reviewed. No pertinent surgical history.   reports that she has never smoked. She has never used smokeless tobacco. She reports that she does not drink alcohol or use drugs.  No Known Allergies  Family History  Problem Relation Age of Onset  . Diabetes Mother   . Hypertension Mother     Prior to Admission medications   Medication Sig Start Date End Date Taking? Authorizing Provider  amLODipine-olmesartan (AZOR) 10-40 MG per tablet Take 1 tablet by mouth daily.   Yes [provider]  aspirin 81 MG tablet Take 81 mg by mouth daily.     Yes [provider]  Calcium Carbonate-Vitamin D (CALCIUM-VITAMIN D)  500-200 MG-UNIT per tablet Take 1 tablet by mouth 2 (two) times daily with a meal.     Yes [provider]  cholecalciferol (VITAMIN D) 1000 UNITS tablet Take 1,000 Units by mouth daily.   Yes [provider]  meloxicam (MOBIC) 15 MG tablet Take 15 mg by mouth daily. 01/12/18  Yes [provider]    metFORMIN (GLUCOPHAGE) 500 MG tablet Take 500 mg by mouth daily.  11/22/17  Yes [provider]  methocarbamol (ROBAXIN) 500 MG tablet Take 500 mg by mouth 2 (two) times daily. 01/02/18  Yes [provider]  metoprolol succinate (TOPROL-XL) 25 MG 24 hr tablet Take 12.5 mg by mouth daily. 11/22/17  Yes [provider]  oxyCODONE-acetaminophen (PERCOCET) 10-325 MG tablet Take 1 tablet by mouth 3 (three) times daily. 01/12/18  Yes [provider]  pregabalin (LYRICA) 50 MG capsule Take 50 mg by mouth 3 (three) times daily.    Yes [provider]  rosuvastatin (CRESTOR) 20 MG tablet Take 20 mg by mouth daily. 11/22/17  Yes [provider]    Physical Exam:  Constitutional: Middle-aged female used to be in NAD, calm, comfortable Vitals:   01/27/18 0233 01/27/18 0245 01/27/18 0315 01/27/18 0330  BP: (!) 91/56 104/67 101/65 107/64  Pulse: 95 99 89 85  Resp: _0 Temp:      TempSrc:      SpO2: 99% 98% 95% 97%  Weight:      Height:       Eyes: PERRL, lids and conjunctivae normal ENMT: Mucous membranes are moist. Posterior pharynx clear of any exudate or lesions.  Neck: normal, supple, no masses, no thyromegaly Respiratory: clear to auscultation bilaterally, no wheezing, no crackles. Normal respiratory effort. No accessory muscle use.  Cardiovascular: Mildly tachycardic, positive 3 out of 6 systolic ejection murmur present.  No extremity edema. 2+ pedal pulses. No carotid bruits.  Abdomen: no tenderness, no masses palpated. No hepatosplenomegaly. Bowel sounds positive.  Musculoskeletal: no clubbing / cyanosis. No joint deformity upper and lower extremities.  Tenderness to palpation noted of the lumbar spine.  Good ROM, no contractures. Normal muscle tone.  Skin: no rashes, lesions, ulcers. No induration Neurologic: CN 2-12 grossly intact. Sensation intact, DTR normal. Strength 5/5 in all 4.  Psychiatric: Normal judgment and insight. Alert  and oriented x 3. Normal mood.     Labs on Admission: I have personally reviewed following labs and imaging studies  CBC: Recent Labs  Lab 01/26/18 2057 01/27/18 0029  WBC 8.8  --   NEUTROABS  --  5.1  HGB 10.0*  --   HCT 29.9*  --   MCV 81.0  --   PLT 138*  --    Basic Metabolic Panel: Recent Labs  Lab 01/26/18 2057  NA 132*  K 4.0  CL 95*  CO2 28  GLUCOSE 237*  BUN 12  CREATININE 1.06*  CALCIUM 9.1   GFR: Estimated Creatinine Clearance: 63.5 mL/min (A) (by C-G formula based on SCr of 1.06 mg/dL (H)). Liver Function Tests: No results for input(s): AST, ALT, ALKPHOS, BILITOT, PROT, ALBUMIN in the last 168 hours. No results for input(s): LIPASE, AMYLASE in the last 168 hours. No results for input(s): AMMONIA in the last 168 hours. Coagulation Profile: No results for input(s): INR, PROTIME in the last 168 hours. Cardiac Enzymes: No results for input(s): CKTOTAL, CKMB, CKMBINDEX, TROPONINI in the last 168 hours. BNP (last 3 results) No results for input(s): PROBNP in  the last 8760 hours. HbA1C: No results for input(s): HGBA1C in the last 72 hours. CBG: No results for input(s): GLUCAP in the last 168 hours. Lipid Profile: No results for input(s): CHOL, HDL, LDLCALC, TRIG, CHOLHDL, LDLDIRECT in the last 72 hours. Thyroid Function Tests: No results for input(s): TSH, T4TOTAL, FREET4, T3FREE, THYROIDAB in the last 72 hours. Anemia Panel: No results for input(s): VITAMINB12, FOLATE, FERRITIN, TIBC, IRON, RETICCTPCT in the last 72 hours. Urine analysis:    Component Value Date/Time   COLORURINE YELLOW 08/01/2013 2111   APPEARANCEUR CLEAR 08/01/2013 2111   LABSPEC 1.008 08/01/2013 2111   PHURINE 6.0 08/01/2013 2111   GLUCOSEU NEGATIVE 08/01/2013 2111   HGBUR SMALL (A) 08/01/2013 2111   BILIRUBINUR NEGATIVE 08/01/2013 2111   [place] 08/01/2013 2111   PROTEINUR NEGATIVE 08/01/2013 2111   UROBILINOGEN 0.2 08/01/2013 2111   NITRITE NEGATIVE 08/01/2013  2111   LEUKOCYTESUR SMALL (A) 08/01/2013 2111   Sepsis Labs: No results found for this or any previous visit (from the past 240 hour(s)).   Radiological Exams on Admission: No results found.    Assessment/Plan Sepsis 2/2 discitis/osteomyelitis: Acute.  Patient presents with fever and tachycardia after recently having MRI study showing early displaced infection and osteomyelitis in the left lateral aspect of L5 and S1.  ESR was noted to be elevated at 53.  Sepsis protocol was initiated and patient was started on empiric antibiotics of vancomycin and Zosyn.  Neurosurgery was consulted, but recommended treating with antibiotics and possible ID consult. - Admit to a telemetry bed - Follow-up blood cultures - Continue empiric antibiotics of vancomycin and Zosyn - May want to consult ID in a.m.  Back pain: Chronic.  - Continue Lyrica and oxycodone as needed pain  Cardiac murmur: Chronic.  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  Question if this is possible vegetation. - Check echocardiogram  Anemia: Hemoglobin noted to be 10 on admission no other recent labs to compare.  Patient reports no reports of bleeding. - Recheck CBC in a.m.  Essential hypertension - continue Azor and metoprolol as tolerated  Diabetes mellitus type 2 - Hypoglycemic protocol - Hold metformin - CBGs q. before meals with moderate SSI  Hyperlipidemia - Continue Crestor  Thrombocytopenia: Chronic.  Platelet count 138 on admission, but blood counts have been seen to be low since back in 2008. 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  DVT prophylaxis: lovenox (discontinue if platelets drop  less than 100) Code Status: full Family Communication: Discussed plan of care with the patient and family present at bedside Disposition Plan: Likely discharge home once medically stable Consults called: none  Admission status: inpatient  Norval Morton MD [facility] Pager [phone]   If 7PM-7AM, please contact night-coverage [email or web address] Password [facility]  01/27/2018, 3:44 AM

## 2018-01-27 NOTE — Progress Notes (Signed)
Pharmacy Antibiotic Note  [name] is a 52 y.o. female admitted on 01/26/2018 with possible discitis/osteomyelitis.  Pharmacy has been consulted for Vancomycin/Zosyn dosing. Pt with MRI earlier this week with concern for early disc space infection and osteomyelitis. WBC WNL. Renal function good.   Plan: Vancomycin 1000 mg IV q12h Zosyn 3.375G IV q8h to be infused over 4 hours Trend WBC, temp, renal function  F/U infectious work-up Drug levels as indicated   Height: 5\' 3"  (160 cm) Weight: 180 lb (81.6 kg) IBW/kg (Calculated) : 52.4  Temp (24hrs), Avg:99.5 F (37.5 C), Min:98.6 F (37 C), Max:100.5 F (38.1 C)  Recent Labs  Lab 01/26/18 2057 01/26/18 2113 01/27/18 0101  WBC 8.8  --   --   CREATININE 1.06*  --   --   LATICACIDVEN  --  1.67 1.00    Estimated Creatinine Clearance: 63.5 mL/min (A) (by C-G formula based on SCr of 1.06 mg/dL (H)).    No Known Allergies    [name] 01/27/2018 1:39 AM

## 2018-01-27 NOTE — ED Notes (Signed)
Pt is NPO per doctor's order.

## 2018-01-27 NOTE — Consult Note (Signed)
Reason for Consult: Lumbar discitis Referring Physician: Dr. Clint Bolder [name] is an 52 y.o. female.  HPI: The patient is a 52 year old black female who has complained of back pain.  By report she was sent for a lumbar MRI at [facility].  This evidently showed findings consistent with a discitis.  She was directed to the [facility], ER.  The patient was admitted by the hospitalist.  Blood cultures were obtained and she was started on empiric antibiotics.  A neurosurgical consultation was requested.  Presently the patient is alert and pleasant.  She complains mainly of back pain but gets some pain into her right greater than left leg.  She has no weakness.  Her husband brought in the CD of her MRI and gave it to someone.  But it has not been loaded into the system yet so I can review the images.  Past Medical History:  Diagnosis Date  . Arthritis 2008   Lumbar Deg Disc Disease  . Chest pain   . HTN (hypertension)   . Migraine   . Thymic cyst ([place])     History reviewed. No pertinent surgical history.  Family History  Problem Relation Age of Onset  . Diabetes Mother   . Hypertension Mother     Social History:  reports that she has never smoked. She has never used smokeless tobacco. She reports that she does not drink alcohol or use drugs.  Allergies: No Known Allergies  Medications:  I have reviewed the patient's current medications. Prior to Admission:  (Not in a hospital admission) Scheduled: . amLODipine  10 mg Oral Daily   And  . irbesartan  300 mg Oral Daily  . aspirin EC  81 mg Oral Daily  . insulin aspart  0-15 Units Subcutaneous TID WC  . insulin aspart  0-5 Units Subcutaneous QHS  . metoprolol succinate  12.5 mg Oral Daily  . oxyCODONE-acetaminophen  1 tablet Oral Q8H   And  . oxyCODONE  5 mg Oral Q8H  . pregabalin  50 mg Oral TID  . rosuvastatin  20 mg Oral Daily  . sodium chloride flush  3 mL Intravenous Q12H   Continuous: . sodium chloride 75 mL/hr at  01/27/18 0102   VOZ:[id number] **OR** acetaminophen, albuterol, methocarbamol, ondansetron **OR** ondansetron (ZOFRAN) IV Anti-infectives (From admission, onward)   Start     Dose/Rate Route Frequency Ordered Stop   01/27/18 1000  vancomycin (VANCOCIN) IVPB 1000 mg/200 mL premix  Status:  Discontinued     1,000 mg 200 mL/hr over 60 Minutes Intravenous Every 12 hours 01/27/18 0143 01/27/18 0655   01/27/18 1000  piperacillin-tazobactam (ZOSYN) IVPB 3.375 g  Status:  Discontinued     3.375 g 12.5 mL/hr over 240 Minutes Intravenous Every 8 hours 01/27/18 0143 01/27/18 0655   01/27/18 0145  piperacillin-tazobactam (ZOSYN) IVPB 3.375 g     3.375 g 100 mL/hr over 30 Minutes Intravenous  Once 01/27/18 0134 01/27/18 0227   01/27/18 0145  vancomycin (VANCOCIN) IVPB 1000 mg/200 mL premix     1,000 mg 200 mL/hr over 60 Minutes Intravenous  Once 01/27/18 0134 01/27/18 0358       Results for orders placed or performed during the hospital encounter of 01/26/18 (from the past 48 hour(s))  CBC     Status: Abnormal   Collection Time: 01/26/18  8:57 PM  Result Value Ref Range   WBC 8.8 4.0 - 10.5 K/uL   RBC 3.69 (L) 3.87 - 5.11 MIL/uL   Hemoglobin  10.0 (L) 12.0 - 15.0 g/dL   HCT 29.9 (L) 36.0 - 46.0 %   MCV 81.0 78.0 - 100.0 fL   MCH 27.1 26.0 - 34.0 pg   MCHC 33.4 30.0 - 36.0 g/dL   RDW 13.8 11.5 - 15.5 %   Platelets 138 (L) 150 - 400 K/uL    Comment: Performed at [facility] [address], [place], [place] [address]  Basic metabolic panel     Status: Abnormal   Collection Time: 01/26/18  8:57 PM  Result Value Ref Range   Sodium 132 (L) 135 - 145 mmol/L   Potassium 4.0 3.5 - 5.1 mmol/L   Chloride 95 (L) 101 - 111 mmol/L   CO2 28 22 - 32 mmol/L   Glucose, Bld 237 (H) 65 - 99 mg/dL   BUN 12 6 - 20 mg/dL   Creatinine, Ser 1.06 (H) 0.44 - 1.00 mg/dL   Calcium 9.1 8.9 - 10.3 mg/dL   GFR calc non Af Amer 60 (L) >60 mL/min   GFR calc Af Amer >60 >60 mL/min    Comment: (NOTE) The  eGFR has been calculated using the CKD EPI equation. This calculation has not been validated in all clinical situations. eGFR's persistently <60 mL/min signify possible Chronic Kidney Disease.    Anion gap 9 5 - 15    Comment: Performed at [facility] [address], [place], [place] [address]  I-Stat CG4 Lactic Acid, ED     Status: None   Collection Time: 01/26/18  9:13 PM  Result Value Ref Range   Lactic Acid, Venous 1.67 0.5 - 1.9 mmol/L  Sedimentation rate     Status: Abnormal   Collection Time: 01/27/18 12:29 AM  Result Value Ref Range   Sed Rate 53 (H) 0 - 22 mm/hr    Comment: Performed at [facility] [address] [place], [place] [address]  Differential     Status: None   Collection Time: 01/27/18 12:29 AM  Result Value Ref Range   Neutrophils Relative % 69 %   Neutro Abs 5.1 1.7 - 7.7 K/uL   Lymphocytes Relative 20 %   Lymphs Abs 1.5 0.7 - 4.0 K/uL   Monocytes Relative 11 %   Monocytes Absolute 0.8 0.1 - 1.0 K/uL   Eosinophils Relative 0 %   Eosinophils Absolute 0.0 0.0 - 0.7 K/uL   Basophils Relative 0 %   Basophils Absolute 0.0 0.0 - 0.1 K/uL   Immature Granulocytes 0 %   Abs Immature Granulocytes 0.0 0.0 - 0.1 K/uL    Comment: Performed at [facility] [address] [place], [place] [address]  I-Stat CG4 Lactic Acid, ED     Status: None   Collection Time: 01/27/18  1:01 AM  Result Value Ref Range   Lactic Acid, Venous 1.00 0.5 - 1.9 mmol/L  CBC     Status: Abnormal   Collection Time: 01/27/18  5:03 AM  Result Value Ref Range   WBC 7.0 4.0 - 10.5 K/uL   RBC 2.96 (L) 3.87 - 5.11 MIL/uL   Hemoglobin 7.9 (L) 12.0 - 15.0 g/dL    Comment: DELTA CHECK NOTED REPEATED TO VERIFY    HCT 23.6 (L) 36.0 - 46.0 %   MCV 79.7 78.0 - 100.0 fL   MCH 26.7 26.0 - 34.0 pg   MCHC 33.5 30.0 - 36.0 g/dL   RDW 13.8 11.5 - 15.5 %   Platelets 122 (L) 150 -  400 K/uL    Comment: Performed at [facility], [place] [address] [place], [place] [address]   Basic metabolic panel     Status: Abnormal   Collection Time: 01/27/18  5:03 AM  Result Value Ref Range   Sodium 136 135 - 145 mmol/L   Potassium 4.1 3.5 - 5.1 mmol/L   Chloride 107 101 - 111 mmol/L   CO2 23 22 - 32 mmol/L   Glucose, Bld 150 (H) 65 - 99 mg/dL   BUN 7 6 - 20 mg/dL   Creatinine, Ser 0.88 0.44 - 1.00 mg/dL   Calcium 7.7 (L) 8.9 - 10.3 mg/dL   GFR calc non Af Amer >60 >60 mL/min   GFR calc Af Amer >60 >60 mL/min    Comment: (NOTE) The eGFR has been calculated using the CKD EPI equation. This calculation has not been validated in all clinical situations. eGFR's persistently <60 mL/min signify possible Chronic Kidney Disease.    Anion gap 6 5 - 15    Comment: Performed at [facility] [address] [place],  [address]  CBG monitoring, ED     Status: Abnormal   Collection Time: 01/27/18  7:32 AM  Result Value Ref Range   Glucose-Capillary 143 (H) 65 - 99 mg/dL    Dg Chest Port 1 View  Result Date: 01/27/2018 CLINICAL DATA:  Cough. EXAM: PORTABLE CHEST 1 VIEW COMPARISON:  CT chest 06/27/2014.  Chest x-ray 03/26/2012. FINDINGS: 0623 hours. Stable mild asymmetric elevation right hemidiaphragm. The lungs are clear without focal pneumonia, edema, pneumothorax or pleural effusion. The cardio pericardial silhouette is enlarged. The visualized bony structures of the thorax are intact. Telemetry leads overlie the chest. IMPRESSION: No active disease. Electronically Signed   By: Misty Stanley M.D.   On: 01/27/2018 07:01    ROS Blood pressure 113/73, pulse 78, temperature (!) 100.5 F (38.1 C), resp. rate 20, height '5\' 3"'$  (1.6 m), weight 81.6 kg (180 lb), last menstrual period 05/18/2012, SpO2 99 %. Estimated body mass index is 31.89 kg/m as calculated from the following:   Height as of this encounter: '5\' 3"'$  (1.6 m).   Weight as of this encounter: 81.6 kg (180 lb).  Physical Exam  General: An alert and pleasant 52 year old black female in no apparent  distress.  HEENT: Normocephalic, atraumatic, extraocular muscles intact  Neck: Unremarkable  Back exam she complains of pain at the lumbosacral junction.  Neurologic exam the patient is alert and oriented x3.  Cranial nerves II through XII are grossly intact bilaterally.  Her motor strength is 5/5 metabolic quadriceps, gastrocnemius, dorsiflexors and upper extremities.  Sensory function is grossly normal to light touch sensation all tested dermatomes bilaterally.    Assessment/Plan: Reported discitis: Again I do not have the benefit of the images.  I would be glad to take a look at her MRI once the images are loaded onto the PACS system.  As it usually is the case, this seems like it should  be treated with cultures and the appropriate antibiotics directed by infectious disease.  I will be available should she need surgery.  Ophelia Charter 01/27/2018, 8:28 AM

## 2018-01-27 NOTE — Progress Notes (Signed)
PROGRESS NOTE        PATIENT DETAILS Name: [name] Age: 52 y.o. Sex: female Date of Birth: [birth year] Admit Date: 01/26/2018 Admitting Physician Norval Morton, MD [id number], Iona Beard, MD  Brief Narrative: Patient is a 52 y.o. female with long-standing history of back pain, DM-2, hypertension presented with a few weeks history of worsening back pain and subjective fever.  She was seen by her outpatient pain management MD, who ordered a MRI of her lumbosacral spine (done at outside facility), and there was question of discitis around L5-S1 area-she was sent to the emergency room and subsequently admitted to the hospitalist service.  Subjective: Apart from back pain she has no other complaints.  Denies urinary or fecal incontinence.  Denies any weakness in her lower extremities.  Assessment/Plan: Fever with back pain-suspected L5-S1 discitis: Await culture results- stopped all antimicrobial therapy-as patient may need a L5-S1 aspirate by IR to increase culture yield.  However, IR has reviewed outpatient MRI images-they are not very convinced that this patient has discitis-however patient did have a low-grade fever on admission-however she does not appear septic-there are no concerning neurological findings-we will consult ID for further antimicrobial therapy recommendations.  Blood cultures are currently pending.  ESR and CRP are significantly elevated  DM-2: Follow CBGs closely while on SSI.  Hypertension: Stable-continue with amlodipine, Avapro and metoprolol.  Dyslipidemia: Continue statin  Thrombocytopenia: Appears to be chronic-follow.  Anemia: Significant drop in hemoglobin-no evidence of blood loss-suspect could be from IV fluid dilution.  Check anemia panel-repeat CBC in a.m.  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  Resume follow-up with cardiothoracic surgery prn  in the outpatient setting  ?  History of HOCM: Await repeat echocardiogram-on beta-blocker  DVT Prophylaxis: Prophylactic Lovenox   Code Status: Full code   Family Communication: Spouse at bedside  Disposition Plan: Remain inpatient-home in the next 2 days or so depending on work-up.  Antimicrobial agents: Anti-infectives (From admission, onward)   Start     Dose/Rate Route Frequency Ordered Stop   01/27/18 1000  vancomycin (VANCOCIN) IVPB 1000 mg/200 mL premix  Status:  Discontinued     1,000 mg 200 mL/hr over 60 Minutes Intravenous Every 12 hours 01/27/18 0143 01/27/18 0655   01/27/18 1000  piperacillin-tazobactam (ZOSYN) IVPB 3.375 g  Status:  Discontinued     3.375 g 12.5 mL/hr over 240 Minutes Intravenous Every 8 hours 01/27/18 0143 01/27/18 0655   01/27/18 0145  piperacillin-tazobactam (ZOSYN) IVPB 3.375 g     3.375 g 100 mL/hr over 30 Minutes Intravenous  Once 01/27/18 0134 01/27/18 0227   01/27/18 0145  vancomycin (VANCOCIN) IVPB 1000 mg/200 mL premix     1,000 mg 200 mL/hr over 60 Minutes Intravenous  Once 01/27/18 0134 01/27/18 0358      Procedures: None  CONSULTS:  IR, NS and ID  Time spent: 25- minutes-Greater than 50% of this time was spent in counseling, explanation of diagnosis, planning of further management, and coordination of care.  MEDICATIONS: Scheduled Meds: . amLODipine  10 mg Oral Daily   And  . irbesartan  300 mg Oral Daily  . aspirin EC  81 mg Oral Daily  . insulin aspart  0-15 Units Subcutaneous TID WC  . insulin aspart  0-5 Units Subcutaneous QHS  . metoprolol succinate  12.5 mg Oral Daily  . oxyCODONE-acetaminophen  1 tablet Oral Q8H   And  . oxyCODONE  5 mg Oral Q8H  . pregabalin  50 mg Oral TID  . rosuvastatin  20 mg Oral Daily  . sodium chloride flush  3 mL Intravenous Q12H   Continuous Infusions: . sodium chloride 75 mL/hr at 01/27/18 0616   PRN Meds:.acetaminophen **OR** acetaminophen, albuterol, methocarbamol,  ondansetron **OR** ondansetron (ZOFRAN) IV   PHYSICAL EXAM: Vital signs: Vitals:   01/27/18 0715 01/27/18 0730 01/27/18 0745 01/27/18 0800  BP:    113/73  Pulse: 74 78 83 78  Resp: '19  13 20  '$ Temp:      TempSrc:      SpO2: 98% 98% 100% 99%  Weight:      Height:       Filed Weights   01/26/18 2024  Weight: 81.6 kg (180 lb)   Body mass index is 31.89 kg/m.   General appearance :Awake, alert, not in any distress. Speech Clear. Not toxic Looking Eyes:, pupils equally reactive to light and accomodation,no scleral icterus.Pink conjunctiva HEENT: Atraumatic and Normocephalic Neck: supple, no JVD. No cervical lymphadenopathy. No thyromegaly Resp:Good air entry bilaterally, no added sounds  CVS: S1 S2 regular,3/6 sys murmur.  GI: Bowel sounds present, Non tender and not distended with no gaurding, rigidity or rebound.No organomegaly Extremities: B/L Lower Ext shows no edema, both legs are warm to touch Neurology:  speech clear,Non focal, sensation is grossly intact. Psychiatric: Normal judgment and insight. Alert and oriented x 3. Normal mood. Musculoskeletal:No digital cyanosis Skin:No Rash, warm and dry Wounds:N/A  I have personally reviewed following labs and imaging studies  LABORATORY DATA: CBC: Recent Labs  Lab 01/26/18 2057 01/27/18 0029 01/27/18 0503  WBC 8.8  --  7.0  NEUTROABS  --  5.1  --   HGB 10.0*  --  7.9*  HCT 29.9*  --  23.6*  MCV 81.0  --  79.7  PLT 138*  --  122*    Basic Metabolic Panel: Recent Labs  Lab 01/26/18 2057 01/27/18 0503  NA 132* 136  K 4.0 4.1  CL 95* 107  CO2 28 23  GLUCOSE 237* 150*  BUN 12 7  CREATININE 1.06* 0.88  CALCIUM 9.1 7.7*    GFR: Estimated Creatinine Clearance: 76.5 mL/min (by C-G formula based on SCr of 0.88 mg/dL).  Liver Function Tests: No results for input(s): AST, ALT, ALKPHOS, BILITOT, PROT, ALBUMIN in the last 168 hours. No results for input(s): LIPASE, AMYLASE in the last 168 hours. No results for  input(s): AMMONIA in the last 168 hours.  Coagulation Profile: Recent Labs  Lab 01/27/18 0716  INR 1.39    Cardiac Enzymes: No results for input(s): CKTOTAL, CKMB, CKMBINDEX, TROPONINI in the last 168 hours.  BNP (last 3 results) No results for input(s): PROBNP in the last 8760 hours.  HbA1C: No results for input(s): HGBA1C in the last 72 hours.  CBG: Recent Labs  Lab 01/27/18 0732  GLUCAP 143*    Lipid Profile: No results for input(s): CHOL, HDL, LDLCALC, TRIG, CHOLHDL, LDLDIRECT in the last 72 hours.  Thyroid Function Tests: No results for input(s): TSH, T4TOTAL, FREET4, T3FREE, THYROIDAB in the last 72 hours.  Anemia Panel: Recent Labs    01/27/18 0656  VITAMINB12 908  FOLATE 12.5  FERRITIN 218  TIBC 196*  IRON 19*  RETICCTPCT 1.0    Urine analysis:    Component Value Date/Time   COLORURINE YELLOW 08/01/2013 2111  APPEARANCEUR CLEAR 08/01/2013 2111   LABSPEC 1.008 08/01/2013 2111   PHURINE 6.0 08/01/2013 2111   GLUCOSEU NEGATIVE 08/01/2013 2111   HGBUR SMALL (A) 08/01/2013 2111   BILIRUBINUR NEGATIVE 08/01/2013 2111   [place] 08/01/2013 2111   PROTEINUR NEGATIVE 08/01/2013 2111   UROBILINOGEN 0.2 08/01/2013 2111   NITRITE NEGATIVE 08/01/2013 2111   LEUKOCYTESUR SMALL (A) 08/01/2013 2111    Sepsis Labs: Lactic Acid, Venous    Component Value Date/Time   LATICACIDVEN 1.00 01/27/2018 0101    MICROBIOLOGY: No results found for this or any previous visit (from the past 240 hour(s)).  RADIOLOGY STUDIES/RESULTS: Dg Chest Port 1 View  Result Date: 01/27/2018 CLINICAL DATA:  Cough. EXAM: PORTABLE CHEST 1 VIEW COMPARISON:  CT chest 06/27/2014.  Chest x-ray 03/26/2012. FINDINGS: 0623 hours. Stable mild asymmetric elevation right hemidiaphragm. The lungs are clear without focal pneumonia, edema, pneumothorax or pleural effusion. The cardio pericardial silhouette is enlarged. The visualized bony structures of the thorax are intact. Telemetry  leads overlie the chest. IMPRESSION: No active disease. Electronically Signed   By: Misty Stanley M.D.   On: 01/27/2018 07:01     LOS: 0 days   Oren Binet, MD  [facility]  If 7PM-7AM, please contact night-coverage  Please page via [email or web address]-Password [email or web address]-click on MD name and type text message  01/27/2018, 11:03 AM

## 2018-01-27 NOTE — ED Notes (Signed)
Pt refused pain medication until lunch tray has delivered. Both meds returned to pyxis.

## 2018-01-27 NOTE — ED Notes (Addendum)
Pt in room and alert, family @ bedside. Pt ambulate to bathroom with assist VS documented Call light within reach

## 2018-01-27 NOTE — ED Notes (Signed)
MD at bedside, advised no abx needed at this time

## 2018-01-28 DIAGNOSIS — I10 Essential (primary) hypertension: Secondary | ICD-10-CM

## 2018-01-28 DIAGNOSIS — M4640 Discitis, unspecified, site unspecified: Secondary | ICD-10-CM

## 2018-01-28 LAB — BASIC METABOLIC PANEL
Anion gap: 10 (ref 5–15)
BUN: 5 mg/dL — ABNORMAL LOW (ref 6–20)
CALCIUM: 8.7 mg/dL — AB (ref 8.9–10.3)
CO2: 24 mmol/L (ref 22–32)
Chloride: 102 mmol/L (ref 101–111)
Creatinine, Ser: 0.82 mg/dL (ref 0.44–1.00)
GFR calc non Af Amer: >60 mL/min (ref >60)
Glucose, Bld: 119 mg/dL — ABNORMAL HIGH (ref 65–99)
Potassium: 3.9 mmol/L (ref 3.5–5.1)
SODIUM: 136 mmol/L (ref 135–145)

## 2018-01-28 LAB — GLUCOSE, CAPILLARY
GLUCOSE-CAPILLARY: 247 mg/dL — AB (ref 65–99)
GLUCOSE-CAPILLARY: 276 mg/dL — AB (ref 65–99)
Glucose-Capillary: 147 mg/dL — ABNORMAL HIGH (ref 65–99)
Glucose-Capillary: 102 mg/dL — ABNORMAL HIGH (ref 65–99)

## 2018-01-28 LAB — CBC
HCT: 28 % — ABNORMAL LOW (ref 36.0–46.0)
Hemoglobin: 9.2 g/dL — ABNORMAL LOW (ref 12.0–15.0)
MCH: 26.7 pg (ref 26.0–34.0)
MCHC: 32.9 g/dL (ref 30.0–36.0)
MCV: 81.4 fL (ref 78.0–100.0)
PLATELETS: 153 10*3/uL (ref 150–400)
RBC: 3.44 MIL/uL — ABNORMAL LOW (ref 3.87–5.11)
RDW: 14.1 % (ref 11.5–15.5)
WBC: 7.3 10*3/uL (ref 4.0–10.5)

## 2018-01-28 NOTE — Progress Notes (Addendum)
PROGRESS NOTE        PATIENT DETAILS Name: [name] Age: 52 y.o. Sex: female Date of Birth: [birth year] Admit Date: 01/26/2018 Admitting Physician Norval Morton, MD [id number], Iona Beard, MD  Brief Narrative: Patient is a 52 y.o. female with long-standing history of back pain, DM-2, hypertension presented with a few weeks history of worsening back pain and subjective fever.  She was seen by her outpatient pain management MD, who ordered a MRI of her lumbosacral spine (done at outside facility), and there was question of discitis around L5-S1 area-she was sent to the emergency room and subsequently admitted to the hospitalist service.  Subjective:  Patient in bed, appears comfortable, denies any headache, no fever, no chest pain or pressure, no shortness of breath , no abdominal pain. No focal weakness.  Continues to have low back pain which is nonradiating.  Assessment/Plan:  Fever with back pain-suspected L5-S1 discitis: With streptococcal bacteremia, ID following currently on Rocephin, ESR and CRP are quite elevated, ID and neurosurgery on board, thoracic echocardiogram stable with a EF of 65%, IR does not think this is an abscess and do not think a CT-guided aspiration is required.  For now on Rocephin for streptococcal bacteremia, defer duration and course to ID.  Continue supportive care for back pain.   DM-2: Follow CBGs closely while on SSI.  CBG (last 3)  Recent Labs    01/27/18 1638 01/27/18 2142 01/28/18 0748  GLUCAP 97 223* 147*     Hypertension: Stable-continue with amlodipine, Avapro and metoprolol.  Dyslipidemia: Continue statin  Thrombocytopenia: Appears to be chronic-follow.  Anemia: Significant drop in hemoglobin-no evidence of blood loss-suspect could be from IV fluid dilution.  Check anemia panel-repeat CBC in a.m.  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  Resume follow-up with cardiothoracic surgery prn in the outpatient setting  ?  History of HOCM: Await repeat echocardiogram-on beta-blocker.    DVT Prophylaxis: Prophylactic Lovenox   Code Status: Full code   Family Communication: Spouse at bedside  Disposition Plan: Remain inpatient-home in the next 2 days or so depending on work-up.  Antimicrobial agents: Anti-infectives (From admission, onward)   Start     Dose/Rate Route Frequency Ordered Stop   01/27/18 1430  cefTRIAXone (ROCEPHIN) 2 g in sodium chloride 0.9 % 100 mL IVPB     2 g 200 mL/hr over 30 Minutes Intravenous Every 24 hours 01/27/18 1406     01/27/18 1000  vancomycin (VANCOCIN) IVPB 1000 mg/200 mL premix  Status:  Discontinued     1,000 mg 200 mL/hr over 60 Minutes Intravenous Every 12 hours 01/27/18 0143 01/27/18 0655   01/27/18 1000  piperacillin-tazobactam (ZOSYN) IVPB 3.375 g  Status:  Discontinued     3.375 g 12.5 mL/hr over 240 Minutes Intravenous Every 8 hours 01/27/18 0143 01/27/18 0655   01/27/18 0145  piperacillin-tazobactam (ZOSYN) IVPB 3.375 g     3.375 g 100 mL/hr over 30 Minutes Intravenous  Once 01/27/18 0134 01/27/18 0227   01/27/18 0145  vancomycin (VANCOCIN) IVPB 1000 mg/200 mL premix     1,000 mg 200 mL/hr over 60 Minutes Intravenous  Once 01/27/18 0134 01/27/18 0358      Procedures: None  CONSULTS:  IR, NS and ID  Time spent: 25- minutes-Greater than 50% of this time was  spent in counseling, explanation of diagnosis, planning of further management, and coordination of care.  MEDICATIONS: Scheduled Meds: . amLODipine  10 mg Oral Daily   And  . irbesartan  300 mg Oral Daily  . aspirin EC  81 mg Oral Daily  . enoxaparin (LOVENOX) injection  40 mg Subcutaneous Q24H  . insulin aspart  0-15 Units Subcutaneous TID WC  . insulin aspart  0-5 Units Subcutaneous QHS  . metoprolol succinate  12.5 mg Oral Daily  . oxyCODONE-acetaminophen  1 tablet Oral Q8H   And  . oxyCODONE   5 mg Oral Q8H  . pregabalin  50 mg Oral TID  . rosuvastatin  20 mg Oral Daily  . sodium chloride flush  3 mL Intravenous Q12H   Continuous Infusions: . sodium chloride 40 mL/hr at 01/27/18 2258  . cefTRIAXone (ROCEPHIN)  IV Stopped (01/27/18 1514)   PRN Meds:.acetaminophen **OR** acetaminophen, albuterol, methocarbamol, ondansetron **OR** ondansetron (ZOFRAN) IV   PHYSICAL EXAM: Vital signs: Vitals:   01/27/18 1555 01/27/18 1616 01/27/18 2141 01/28/18 0526  BP:  125/86 90/67 126/80  Pulse:  (!) 103 88 74  Resp:  _0 Temp: 99.5 F (37.5 C) 99.2 F (37.3 C) 98.5 F (36.9 C) 98.5 F (36.9 C)  TempSrc: Oral Oral Oral Oral  SpO2:  100% 98% 99%  Weight:      Height:       Filed Weights   01/26/18 2024  Weight: 81.6 kg (180 lb)   Body mass index is 31.89 kg/m.    Exam   Awake Alert, Oriented X 3, No new F.N deficits, Normal affect [place].AT,PERRAL Supple Neck,No JVD, No cervical lymphadenopathy appriciated.  Symmetrical Chest wall movement, Good air movement bilaterally, CTAB RRR,No Gallops, Rubs chronic aortic systolic murmur, No Parasternal Heave +ve B.Sounds, Abd Soft, No tenderness, No organomegaly appriciated, No rebound - guarding or rigidity. No Cyanosis, Clubbing or edema, No new Rash or bruise    I have personally reviewed following labs and imaging studies  LABORATORY DATA: CBC: Recent Labs  Lab 01/26/18 2057 01/27/18 0029 01/27/18 0503 01/28/18 0633  WBC 8.8  --  7.0 7.3  NEUTROABS  --  5.1  --   --   HGB 10.0*  --  7.9* 9.2*  HCT 29.9*  --  23.6* 28.0*  MCV 81.0  --  79.7 81.4  PLT 138*  --  122* 782    Basic Metabolic Panel: Recent Labs  Lab 01/26/18 2057 01/27/18 0503 01/28/18 0633  NA 132* 136 136  K 4.0 4.1 3.9  CL 95* 107 102  CO2 _1 GLUCOSE 237* 150* 119*  BUN 12 7 5*  CREATININE 1.06* 0.88 0.82  CALCIUM 9.1 7.7* 8.7*    GFR: Estimated Creatinine Clearance: 82.1 mL/min (by C-G formula based on SCr of 0.82  mg/dL).  Liver Function Tests: No results for input(s): AST, ALT, ALKPHOS, BILITOT, PROT, ALBUMIN in the last 168 hours. No results for input(s): LIPASE, AMYLASE in the last 168 hours. No results for input(s): AMMONIA in the last 168 hours.  Coagulation Profile: Recent Labs  Lab 01/27/18 0716  INR 1.39    Cardiac Enzymes: No results for input(s): CKTOTAL, CKMB, CKMBINDEX, TROPONINI in the last 168 hours.  BNP (last 3 results) No results for input(s): PROBNP in the last 8760 hours.  HbA1C: No results for input(s): HGBA1C in the last 72 hours.  CBG: Recent Labs  Lab 01/27/18 0732 01/27/18 1216 01/27/18 1638 01/27/18 2142 01/28/18  0748  GLUCAP 143* 100* 97 223* 147*    Lipid Profile: No results for input(s): CHOL, HDL, LDLCALC, TRIG, CHOLHDL, LDLDIRECT in the last 72 hours.  Thyroid Function Tests: No results for input(s): TSH, T4TOTAL, FREET4, T3FREE, THYROIDAB in the last 72 hours.  Anemia Panel: Recent Labs    01/27/18 0656  VITAMINB12 908  FOLATE 12.5  FERRITIN 218  TIBC 196*  IRON 19*  RETICCTPCT 1.0    Urine analysis:    Component Value Date/Time   COLORURINE YELLOW 08/01/2013 2111   APPEARANCEUR CLEAR 08/01/2013 2111   LABSPEC 1.008 08/01/2013 2111   PHURINE 6.0 08/01/2013 2111   GLUCOSEU NEGATIVE 08/01/2013 2111   HGBUR SMALL (A) 08/01/2013 2111   [place] NEGATIVE 08/01/2013 2111   KETONESUR NEGATIVE 08/01/2013 2111   PROTEINUR NEGATIVE 08/01/2013 2111   UROBILINOGEN 0.2 08/01/2013 2111   NITRITE NEGATIVE 08/01/2013 2111   LEUKOCYTESUR SMALL (A) 08/01/2013 2111    Sepsis Labs: Lactic Acid, Venous    Component Value Date/Time   LATICACIDVEN 1.00 01/27/2018 0101    MICROBIOLOGY: Recent Results (from the past 240 hour(s))  Blood culture (routine x 2)     Status: None (Preliminary result)   Collection Time: 01/26/18  8:45 PM  Result Value Ref Range Status   Specimen Description BLOOD LEFT ARM  Final   Special Requests   Final     BOTTLES DRAWN AEROBIC AND ANAEROBIC Blood Culture results may not be optimal due to an excessive volume of blood received in culture bottles   Culture  Setup Time   Final    GRAM POSITIVE COCCI IN CHAINS IN BOTH AEROBIC AND ANAEROBIC BOTTLES CRITICAL RESULT CALLED TO, READ BACK BY AND VERIFIED WITH: E MARTIN,PHARMD AT 1352 01/27/18 BY L BENFIELD Performed at [facility], [place] [address] [place], [place] [address]    Culture GRAM POSITIVE COCCI  Final   Report Status PENDING  Incomplete  Blood Culture ID Panel (Reflexed)     Status: Abnormal   Collection Time: 01/26/18  8:45 PM  Result Value Ref Range Status   Enterococcus species NOT DETECTED NOT DETECTED Final   Listeria monocytogenes NOT DETECTED NOT DETECTED Final   Staphylococcus species NOT DETECTED NOT DETECTED Final   Staphylococcus aureus NOT DETECTED NOT DETECTED Final   Streptococcus species DETECTED (A) NOT DETECTED Final    Comment: Not Enterococcus species, Streptococcus agalactiae, Streptococcus pyogenes, or Streptococcus pneumoniae. CRITICAL RESULT CALLED TO, READ BACK BY AND VERIFIED WITH: E MARTIN,PHARMD AT 1352 01/27/18 BY L BENFIELD    Streptococcus agalactiae NOT DETECTED NOT DETECTED Final   Streptococcus pneumoniae NOT DETECTED NOT DETECTED Final   Streptococcus pyogenes NOT DETECTED NOT DETECTED Final   Acinetobacter baumannii NOT DETECTED NOT DETECTED Final   Enterobacteriaceae species NOT DETECTED NOT DETECTED Final   Enterobacter cloacae complex NOT DETECTED NOT DETECTED Final   Escherichia coli NOT DETECTED NOT DETECTED Final   Klebsiella oxytoca NOT DETECTED NOT DETECTED Final   Klebsiella pneumoniae NOT DETECTED NOT DETECTED Final   Proteus species NOT DETECTED NOT DETECTED Final   Serratia marcescens NOT DETECTED NOT DETECTED Final   Haemophilus influenzae NOT DETECTED NOT DETECTED Final   Neisseria meningitidis NOT DETECTED NOT DETECTED Final   Pseudomonas aeruginosa NOT DETECTED NOT DETECTED Final    Candida albicans NOT DETECTED NOT DETECTED Final   Candida glabrata NOT DETECTED NOT DETECTED Final   Candida krusei NOT DETECTED NOT DETECTED Final   Candida parapsilosis NOT DETECTED NOT DETECTED Final  Candida tropicalis NOT DETECTED NOT DETECTED Final    Comment: Performed at [facility], [place] [address] [place], [place] [address]  Blood culture (routine x 2)     Status: None (Preliminary result)   Collection Time: 01/26/18  9:00 PM  Result Value Ref Range Status   Specimen Description BLOOD RIGHT ARM  Final   Special Requests   Final    BOTTLES DRAWN AEROBIC AND ANAEROBIC Blood Culture results may not be optimal due to an excessive volume of blood received in culture bottles   Culture  Setup Time   Final    GRAM POSITIVE COCCI IN CHAINS IN BOTH AEROBIC AND ANAEROBIC BOTTLES CRITICAL RESULT CALLED TO, READ BACK BY AND VERIFIED WITH: E MARTIN,PHARMD AT 1352 01/27/18 BY L BENFIELD Performed at [facility], [place] [address], [place], [place] [address]    Culture [facility] POSITIVE COCCI  Final   Report Status PENDING  Incomplete    RADIOLOGY STUDIES/RESULTS: Dg Chest Port 1 View  Result Date: 01/27/2018 CLINICAL DATA:  Cough. EXAM: PORTABLE CHEST 1 VIEW COMPARISON:  CT chest 06/27/2014.  Chest x-ray 03/26/2012. FINDINGS: 0623 hours. Stable mild asymmetric elevation right hemidiaphragm. The lungs are clear without focal pneumonia, edema, pneumothorax or pleural effusion. The cardio pericardial silhouette is enlarged. The visualized bony structures of the thorax are intact. Telemetry leads overlie the chest. IMPRESSION: No active disease. Electronically Signed   By: Misty Stanley M.D.   On: 01/27/2018 07:01     LOS: 1 day   Lala Lund, MD  [facility]  If 7PM-7AM, please contact night-coverage  Please page via [email or web address]-click on MD name and type text message  01/28/2018, 12:05 PM

## 2018-01-29 LAB — CULTURE, BLOOD (ROUTINE X 2)

## 2018-01-29 LAB — SEDIMENTATION RATE: Sed Rate: 116 mm/hr — ABNORMAL HIGH (ref 0–22)

## 2018-01-29 LAB — GLUCOSE, CAPILLARY
GLUCOSE-CAPILLARY: 225 mg/dL — AB (ref 65–99)
Glucose-Capillary: 117 mg/dL — ABNORMAL HIGH (ref 65–99)
Glucose-Capillary: 251 mg/dL — ABNORMAL HIGH (ref 65–99)
Glucose-Capillary: 125 mg/dL — ABNORMAL HIGH (ref 65–99)

## 2018-01-29 LAB — C-REACTIVE PROTEIN: CRP: 11.2 mg/dL — AB (ref <1.0)

## 2018-01-29 LAB — PROCALCITONIN: PROCALCITONIN: 0.24 ng/mL

## 2018-01-29 MED ORDER — PENICILLIN G POTASSIUM 5000000 UNITS IJ SOLR
4.0000 10*6.[IU] | INTRAMUSCULAR | Status: DC
  Administered 2018-01-29 – 2018-01-30 (×5): 4 10*6.[IU] via INTRAVENOUS
  Filled 2018-01-29 (×8): qty 4

## 2018-01-29 NOTE — Progress Notes (Signed)
PROGRESS NOTE        PATIENT DETAILS Name: [name] Age: 52 y.o. Sex: female Date of Birth: [birth year] Admit Date: 01/26/2018 Admitting Physician Norval Morton, MD [id number], Iona Beard, MD  Brief Narrative: Patient is a 52 y.o. female with long-standing history of back pain, DM-2, hypertension presented with a few weeks history of worsening back pain and subjective fever.  She was seen by her outpatient pain management MD, who ordered a MRI of her lumbosacral spine (done at outside facility), and there was question of discitis around L5-S1 area-she was sent to the emergency room and subsequently admitted to the hospitalist service.  Subjective:  Patient in bed, appears comfortable, denies any headache, no fever, no chest pain or pressure, no shortness of breath , no abdominal pain. No focal weakness. She does have dull constant lower back pain, no lower extremity weakness but no bowel or bladder incontinence.  Assessment/Plan:  Fever with back pain-suspected L5-S1 discitis: With streptococcal bacteremia, ID following currently on Rocephin, ESR and CRP are quite elevated, ID and neurosurgery on board, thoracic echocardiogram stable with a EF of 65%, IR does not think this is an abscess and do not think a CT-guided aspiration is required.  He was seen by ID case discussed with Dr. Alton Revere in detail plan is 8 weeks of IV ampicillin via PICC line.  With close outpatient ID and neurosurgery follow-up.  Repeat surveillance cultures were drawn on 01/29/2018 along with procalcitonin and ESR along with CRP levels.  Surveillance cultures are so far negative, sed rate is significantly elevated at 116, CRP elevated at 11.2, calcitonin was stable at 0.18.   DM-2: Renew home regimen unchanged.  Hypertension: Blood pressure on home medications was running low normal hence metoprolol was continued but Norvasc and Avapro combination was stopped instead placed on 10 mg of  lisinopril, PCP to monitor blood pressure and adjust meds as needed.  Dyslipidemia: Continue statin  Thrombocytopenia: Appears to be chronic-follow.  Anemia: Significant drop in hemoglobin-no evidence of blood loss-suspect could be from IV fluid dilution.  Check anemia panel-repeat CBC in a.m.  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  Resume follow-up with cardiothoracic surgery prn in the outpatient setting  ?  History of HOCM: Table echocardiogram she is already on beta-blocker.  Does have chronic systolic murmur which appears functional according to the echo.    DVT Prophylaxis: Prophylactic Lovenox   Code Status: Full code   Family Communication: Spouse at bedside  Disposition Plan: Remain inpatient-home in the next 2 days or so depending on work-up.  Antimicrobial agents: Anti-infectives (From admission, onward)   Start     Dose/Rate Route Frequency Ordered Stop   01/30/18 0000  ampicillin IVPB     12 g Intravenous Every 24 hours 01/30/18 1105 03/24/18 2359   01/29/18 1900  penicillin G potassium 4 Million Units in dextrose 5 % 250 mL IVPB     4 Million Units 250 mL/hr over 60 Minutes Intravenous Every 4 hours 01/29/18 1646     01/27/18 1430  cefTRIAXone (ROCEPHIN) 2 g in sodium chloride 0.9 % 100 mL IVPB  Status:  Discontinued     2 g 200 mL/hr over 30 Minutes Intravenous Every 24 hours 01/27/18 1406 01/29/18 1622   01/27/18 1000  vancomycin (VANCOCIN) IVPB 1000  mg/200 mL premix  Status:  Discontinued     1,000 mg 200 mL/hr over 60 Minutes Intravenous Every 12 hours 01/27/18 0143 01/27/18 0655   01/27/18 1000  piperacillin-tazobactam (ZOSYN) IVPB 3.375 g  Status:  Discontinued     3.375 g 12.5 mL/hr over 240 Minutes Intravenous Every 8 hours 01/27/18 0143 01/27/18 0655   01/27/18 0145  piperacillin-tazobactam (ZOSYN) IVPB 3.375 g     3.375 g 100 mL/hr over 30 Minutes Intravenous  Once  01/27/18 0134 01/27/18 0227   01/27/18 0145  vancomycin (VANCOCIN) IVPB 1000 mg/200 mL premix     1,000 mg 200 mL/hr over 60 Minutes Intravenous  Once 01/27/18 0134 01/27/18 0358      Procedures:  TTE  Left ventricle: The cavity size was normal. Wall thickness was increased in a pattern of moderate LVH. Systolic function was vigorous. The estimated ejection fraction was in the range of 65% to 70%. Wall motion was normal; there were no regional wall motion abnormalities. Doppler parameters are consistent with abnormal left ventricular relaxation (grade 1 diastolic   dysfunction).   Aortic valve: Trileaflet; moderately thickened, moderately calcified leaflets. Transvalvular velocity was minimally  increased. There was no stenosis. Peak velocity (S): 217 cm/s. Mean gradient (S): 10 mm Hg. Valve area (VTI): 2.83 cm^2. Valve   area (Vmax): 3.05 cm^2. Valve area (Vmean): 2.76 cm^2.   Mitral valve: Mildly thickened leaflets . There was mild systolic  anterior motion. There was mild regurgitation.    Left atrium: The atrium was moderately dilated.   CONSULTS:  IR, NS and ID  Time spent: 25- minutes-Greater than 50% of this time was spent in counseling, explanation of diagnosis, planning of further management, and coordination of care.  MEDICATIONS: Scheduled Meds: . aspirin EC  81 mg Oral Daily  . enoxaparin (LOVENOX) injection  40 mg Subcutaneous Q24H  . insulin aspart  0-15 Units Subcutaneous TID WC  . insulin aspart  0-5 Units Subcutaneous QHS  . metoprolol succinate  12.5 mg Oral Daily  . oxyCODONE-acetaminophen  1 tablet Oral Q8H   And  . oxyCODONE  5 mg Oral Q8H  . pregabalin  50 mg Oral TID  . rosuvastatin  20 mg Oral Daily  . sodium chloride flush  3 mL Intravenous Q12H   Continuous Infusions: . sodium chloride 40 mL/hr at 01/29/18 0611  . pencillin G potassium IV Stopped (01/30/18 0714)   PRN Meds:.acetaminophen **OR** acetaminophen, albuterol, methocarbamol,  ondansetron **OR** ondansetron (ZOFRAN) IV   PHYSICAL EXAM: Vital signs: Vitals:   01/29/18 0513 01/29/18 1314 01/29/18 2128 01/30/18 0552  BP: 107/65 95/65 114/78 96/60  Pulse: 78 65 94 77  Resp:  '14 18 18  '$ Temp: 99 F (37.2 C) 98.3 F (36.8 C) 98.4 F (36.9 C) 99.3 F (37.4 C)  TempSrc: Oral Oral Oral Oral  SpO2: 100% 98% 100% 98%  Weight:      Height:       Filed Weights   01/26/18 2024  Weight: 81.6 kg (180 lb)   Body mass index is 31.89 kg/m.    Exam  Awake Alert, Oriented X 3, No new F.N deficits, Normal affect [place].AT,PERRAL Supple Neck,No JVD, No cervical lymphadenopathy appriciated.  Symmetrical Chest wall movement, Good air movement bilaterally, CTAB RRR,No Gallops, Rubs, chronic loud aortic systolic murmur, No Parasternal Heave +ve B.Sounds, Abd Soft, No tenderness, No organomegaly appriciated, No rebound - guarding or rigidity. No Cyanosis, Clubbing or edema, No new Rash or bruise, mild lower L-spine tenderness on  palpation,   I have personally reviewed following labs and imaging studies  LABORATORY DATA: CBC: Recent Labs  Lab 01/26/18 2057 01/27/18 0029 01/27/18 0503 01/28/18 0633 01/30/18 0325  WBC 8.8  --  7.0 7.3 7.7  NEUTROABS  --  5.1  --   --   --   HGB 10.0*  --  7.9* 9.2* 8.7*  HCT 29.9*  --  23.6* 28.0* 26.5*  MCV 81.0  --  79.7 81.4 81.0  PLT 138*  --  122* 153 233    Basic Metabolic Panel: Recent Labs  Lab 01/26/18 2057 01/27/18 0503 01/28/18 0633 01/30/18 0325  NA 132* 136 136 135  K 4.0 4.1 3.9 4.0  CL 95* 107 102 102  CO2 '28 23 24 25  '$ GLUCOSE 237* 150* 119* 223*  BUN 12 7 5* 6  CREATININE 1.06* 0.88 0.82 0.96  CALCIUM 9.1 7.7* 8.7* 8.4*    GFR: Estimated Creatinine Clearance: 70.2 mL/min (by C-G formula based on SCr of 0.96 mg/dL).  Liver Function Tests: No results for input(s): AST, ALT, ALKPHOS, BILITOT, PROT, ALBUMIN in the last 168 hours. No results for input(s): LIPASE, AMYLASE in the last 168 hours. No  results for input(s): AMMONIA in the last 168 hours.  Coagulation Profile: Recent Labs  Lab 01/27/18 0716  INR 1.39    Cardiac Enzymes: No results for input(s): CKTOTAL, CKMB, CKMBINDEX, TROPONINI in the last 168 hours.  BNP (last 3 results) No results for input(s): PROBNP in the last 8760 hours.  HbA1C: No results for input(s): HGBA1C in the last 72 hours.  CBG: Recent Labs  Lab 01/29/18 0736 01/29/18 1232 01/29/18 1657 01/29/18 2130 01/30/18 0806  GLUCAP 117* 251* 125* 225* 191*    Lipid Profile: No results for input(s): CHOL, HDL, LDLCALC, TRIG, CHOLHDL, LDLDIRECT in the last 72 hours.  Thyroid Function Tests: No results for input(s): TSH, T4TOTAL, FREET4, T3FREE, THYROIDAB in the last 72 hours.  Anemia Panel: No results for input(s): VITAMINB12, FOLATE, FERRITIN, TIBC, IRON, RETICCTPCT in the last 72 hours.  Urine analysis:    Component Value Date/Time   COLORURINE YELLOW 08/01/2013 2111   APPEARANCEUR CLEAR 08/01/2013 2111   LABSPEC 1.008 08/01/2013 2111   PHURINE 6.0 08/01/2013 2111   GLUCOSEU NEGATIVE 08/01/2013 2111   HGBUR SMALL (A) 08/01/2013 2111   [place] NEGATIVE 08/01/2013 2111   KETONESUR NEGATIVE 08/01/2013 2111   PROTEINUR NEGATIVE 08/01/2013 2111   UROBILINOGEN 0.2 08/01/2013 2111   NITRITE NEGATIVE 08/01/2013 2111   LEUKOCYTESUR SMALL (A) 08/01/2013 2111    Sepsis Labs: Lactic Acid, Venous    Component Value Date/Time   LATICACIDVEN 1.00 01/27/2018 0101    MICROBIOLOGY: Recent Results (from the past 240 hour(s))  Blood culture (routine x 2)     Status: Abnormal   Collection Time: 01/26/18  8:45 PM  Result Value Ref Range Status   Specimen Description BLOOD LEFT ARM  Final   Special Requests   Final    BOTTLES DRAWN AEROBIC AND ANAEROBIC Blood Culture results may not be optimal due to an excessive volume of blood received in culture bottles   Culture  Setup Time   Final    GRAM POSITIVE COCCI IN CHAINS IN BOTH AEROBIC AND  ANAEROBIC BOTTLES CRITICAL RESULT CALLED TO, READ BACK BY AND VERIFIED WITH: E MARTIN,PHARMD AT 1352 01/27/18 BY L BENFIELD Performed at [facility], [place] [address], [place], [place] [address]    Culture VIRIDANS STREPTOCOCCUS (A)  Final   Report  Status 01/29/2018 FINAL  Final   Organism ID, Bacteria VIRIDANS STREPTOCOCCUS  Final      Susceptibility   Viridans streptococcus - MIC*    PENICILLIN <=0.06 SENSITIVE Sensitive     CEFTRIAXONE <=0.12 SENSITIVE Sensitive     ERYTHROMYCIN 2 RESISTANT Resistant     LEVOFLOXACIN 0.5 SENSITIVE Sensitive     VANCOMYCIN 0.5 SENSITIVE Sensitive     * VIRIDANS STREPTOCOCCUS  Blood Culture ID Panel (Reflexed)     Status: Abnormal   Collection Time: 01/26/18  8:45 PM  Result Value Ref Range Status   Enterococcus species NOT DETECTED NOT DETECTED Final   Listeria monocytogenes NOT DETECTED NOT DETECTED Final   Staphylococcus species NOT DETECTED NOT DETECTED Final   Staphylococcus aureus NOT DETECTED NOT DETECTED Final   Streptococcus species DETECTED (A) NOT DETECTED Final    Comment: Not Enterococcus species, Streptococcus agalactiae, Streptococcus pyogenes, or Streptococcus pneumoniae. CRITICAL RESULT CALLED TO, READ BACK BY AND VERIFIED WITH: E MARTIN,PHARMD AT 1352 01/27/18 BY L BENFIELD    Streptococcus agalactiae NOT DETECTED NOT DETECTED Final   Streptococcus pneumoniae NOT DETECTED NOT DETECTED Final   Streptococcus pyogenes NOT DETECTED NOT DETECTED Final   Acinetobacter baumannii NOT DETECTED NOT DETECTED Final   Enterobacteriaceae species NOT DETECTED NOT DETECTED Final   Enterobacter cloacae complex NOT DETECTED NOT DETECTED Final   Escherichia coli NOT DETECTED NOT DETECTED Final   Klebsiella oxytoca NOT DETECTED NOT DETECTED Final   Klebsiella pneumoniae NOT DETECTED NOT DETECTED Final   Proteus species NOT DETECTED NOT DETECTED Final   Serratia marcescens NOT DETECTED NOT DETECTED Final   Haemophilus influenzae NOT DETECTED NOT  DETECTED Final   Neisseria meningitidis NOT DETECTED NOT DETECTED Final   Pseudomonas aeruginosa NOT DETECTED NOT DETECTED Final   Candida albicans NOT DETECTED NOT DETECTED Final   Candida glabrata NOT DETECTED NOT DETECTED Final   Candida krusei NOT DETECTED NOT DETECTED Final   Candida parapsilosis NOT DETECTED NOT DETECTED Final   Candida tropicalis NOT DETECTED NOT DETECTED Final    Comment: Performed at [facility], [place]. [address] [place], [place] [address]  Blood culture (routine x 2)     Status: Abnormal   Collection Time: 01/26/18  9:00 PM  Result Value Ref Range Status   Specimen Description BLOOD RIGHT ARM  Final   Special Requests   Final    BOTTLES DRAWN AEROBIC AND ANAEROBIC Blood Culture results may not be optimal due to an excessive volume of blood received in culture bottles   Culture  Setup Time   Final    GRAM POSITIVE COCCI IN CHAINS IN BOTH AEROBIC AND ANAEROBIC BOTTLES CRITICAL RESULT CALLED TO, READ BACK BY AND VERIFIED WITH: E MARTIN,PHARMD AT 1352 01/27/18 BY L BENFIELD    Culture (A)  Final    VIRIDANS STREPTOCOCCUS SUSCEPTIBILITIES PERFORMED ON PREVIOUS CULTURE WITHIN THE LAST 5 DAYS. Performed at [facility], [place] [address] [place], [place] [address]    Report Status 01/29/2018 FINAL  Final  Culture, blood (routine x 2)     Status: None (Preliminary result)   Collection Time: 01/28/18  6:33 AM  Result Value Ref Range Status   Specimen Description BLOOD SITE NOT SPECIFIED  Final   Special Requests   Final    BOTTLES DRAWN AEROBIC ONLY Blood Culture results may not be optimal due to an inadequate volume of blood received in culture bottles   Culture   Final    NO GROWTH  1 DAY Performed at [facility], [place] [address], [place], [place] [address]    Report Status PENDING  Incomplete  Culture, blood (routine x 2)     Status: None (Preliminary result)   Collection Time: 01/28/18  6:36 AM  Result Value Ref Range Status   Specimen  Description BLOOD SITE NOT SPECIFIED  Final   Special Requests   Final    BOTTLES DRAWN AEROBIC ONLY Blood Culture results may not be optimal due to an inadequate volume of blood received in culture bottles   Culture   Final    NO GROWTH 1 DAY Performed at [facility], [place] [address], [place], [place] [address]    Report Status PENDING  Incomplete    RADIOLOGY STUDIES/RESULTS: Dg Chest Port 1 View  Result Date: 01/27/2018 CLINICAL DATA:  Cough. EXAM: PORTABLE CHEST 1 VIEW COMPARISON:  CT chest 06/27/2014.  Chest x-ray 03/26/2012. FINDINGS: 0623 hours. Stable mild asymmetric elevation right hemidiaphragm. The lungs are clear without focal pneumonia, edema, pneumothorax or pleural effusion. The cardio pericardial silhouette is enlarged. The visualized bony structures of the thorax are intact. Telemetry leads overlie the chest. IMPRESSION: No active disease. Electronically Signed   By: Misty Stanley M.D.   On: 01/27/2018 07:01   [place] Ekg Site Rite  Result Date: 01/30/2018 If Site Rite image not attached, placement could not be confirmed due to current cardiac rhythm.    LOS: 3 days   Lala Lund, MD  [facility]  If 7PM-7AM, please contact night-coverage  Please page via [email or web address]-Password [email or web address]-click on MD name and type text message  01/30/2018, 11:10 AM

## 2018-01-29 NOTE — Progress Notes (Signed)
Pharmacy Antibiotic Note  [name] is a 51 y.o. female admitted on 01/26/2018 with bacteremia.  Pharmacy has been consulted for penicillin G dosing.  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 Was receiving ceftriaxone - blood cultures came back positive for viridans strept (R erythromycin). Consult to change to penicillin G. Scr 0.82 (CrCl ~82 mL/min). CRP 9.4>11.2. Sed rate 53>116. Afebrile.   Last dose of ceftriaxone was on 6/9 at 1405.   Plan: Start penicillin G potassium 4 million units IV every 4 hours Monitor renal function, clinical pic, cx results  Height: 5\' 3"  (160 cm) Weight: 180 lb (81.6 kg) IBW/kg (Calculated) : 52.4  Temp (24hrs), Avg:98.7 F (37.1 C), Min:98.3 F (36.8 C), Max:99 F (37.2 C)  Recent Labs  Lab 01/26/18 2057 01/26/18 2113 01/27/18 0101 01/27/18 0503 01/28/18 0633  WBC 8.8  --   --  7.0 7.3  CREATININE 1.06*  --   --  0.88 0.82  LATICACIDVEN  --  1.67 1.00  --   --     Estimated Creatinine Clearance: 82.1 mL/min (by C-G formula based on SCr of 0.82 mg/dL).    No Known Allergies   Antimicrobials this admission: Penicillin G potassium 6/9>> Ceftriaxone 6/7>>6/9 Zosyn/vanc x1 on 6/7  Dose adjustments this admission: N/A  Microbiology results: Bcx 6/6>> viridans strep bacteremia (R erythromycin) Bcx 6/8: NGTD  Thank you for allowing pharmacy to be a part of this patient's care.  Doylene Canard, PharmD Clinical Pharmacist  Pager: [phone] Phone: [phone] 01/29/2018 5:24 PM

## 2018-01-30 ENCOUNTER — Inpatient Hospital Stay: Payer: Self-pay

## 2018-01-30 DIAGNOSIS — E119 Type 2 diabetes mellitus without complications: Secondary | ICD-10-CM | POA: Diagnosis not present

## 2018-01-30 DIAGNOSIS — M4646 Discitis, unspecified, lumbar region: Secondary | ICD-10-CM | POA: Diagnosis not present

## 2018-01-30 LAB — CBC
HCT: 26.5 % — ABNORMAL LOW (ref 36.0–46.0)
HEMOGLOBIN: 8.7 g/dL — AB (ref 12.0–15.0)
MCH: 26.6 pg (ref 26.0–34.0)
MCHC: 32.8 g/dL (ref 30.0–36.0)
MCV: 81 fL (ref 78.0–100.0)
PLATELETS: 174 10*3/uL (ref 150–400)
RBC: 3.27 MIL/uL — AB (ref 3.87–5.11)
RDW: 13.8 % (ref 11.5–15.5)
WBC: 7.7 10*3/uL (ref 4.0–10.5)

## 2018-01-30 LAB — BASIC METABOLIC PANEL
ANION GAP: 8 (ref 5–15)
BUN: 6 mg/dL (ref 6–20)
CHLORIDE: 102 mmol/L (ref 101–111)
CO2: 25 mmol/L (ref 22–32)
Calcium: 8.4 mg/dL — ABNORMAL LOW (ref 8.9–10.3)
Creatinine, Ser: 0.96 mg/dL (ref 0.44–1.00)
GFR calc Af Amer: >60 mL/min (ref >60)
GLUCOSE: 223 mg/dL — AB (ref 65–99)
POTASSIUM: 4 mmol/L (ref 3.5–5.1)
Sodium: 135 mmol/L (ref 135–145)

## 2018-01-30 LAB — GLUCOSE, CAPILLARY
GLUCOSE-CAPILLARY: 160 mg/dL — AB (ref 65–99)
Glucose-Capillary: 191 mg/dL — ABNORMAL HIGH (ref 65–99)
Glucose-Capillary: 150 mg/dL — ABNORMAL HIGH (ref 65–99)

## 2018-01-30 LAB — PROCALCITONIN: Procalcitonin: 0.18 ng/mL

## 2018-01-30 MED ORDER — LISINOPRIL 10 MG PO TABS: 10.0000 mg | ORAL_TABLET | Freq: Every day | ORAL | 0 refills | Status: DC

## 2018-01-30 MED ORDER — AMPICILLIN IV (FOR PTA / DISCHARGE USE ONLY): 12.0000 g | INTRAVENOUS | 0 refills | Status: DC

## 2018-01-30 MED ORDER — PENICILLIN G POTASSIUM 5000000 UNITS IJ SOLR
4.0000 10*6.[IU] | INTRAVENOUS | Status: DC
  Filled 2018-01-30 (×4): qty 4

## 2018-01-30 MED ORDER — SODIUM CHLORIDE 0.9% FLUSH: 10.0000 mL | INTRAVENOUS | Status: DC | PRN

## 2018-01-30 NOTE — Discharge Instructions (Signed)
Follow with Primary MD Benito Mccreedy, MD in 7 days   Get CBC, CMP, 2 view Chest X ray checked  by Primary MD  MD in 5-7 days   Activity: As tolerated with Full fall precautions use walker/cane & assistance as needed  Disposition Home   Diet:   Heart healthy low carbohydrate  For Heart failure patients - Check your Weight same time everyday, if you gain over 2 pounds, or you develop in leg swelling, experience more shortness of breath or chest pain, call your Primary MD immediately. Follow Cardiac Low Salt Diet and 1.5 lit/day fluid restriction.  Special Instructions: If you have smoked or chewed Tobacco  in the last 2 yrs please stop smoking, stop any regular Alcohol  and or any Recreational drug use.  On your next visit with your primary care physician please Get Medicines reviewed and adjusted.  Please request your Prim.MD to go over all Hospital Tests and Procedure/Radiological results at the follow up, please get all Hospital records sent to your Prim MD by signing hospital release before you go home.  If you experience worsening of your admission symptoms, develop shortness of breath, life threatening emergency, suicidal or homicidal thoughts you must seek medical attention immediately by calling 911 or calling your MD immediately  if symptoms less severe.  You Must read complete instructions/literature along with all the possible adverse reactions/side effects for all the Medicines you take and that have been prescribed to you. Take any new Medicines after you have completely understood and accpet all the possible adverse reactions/side effects.

## 2018-01-30 NOTE — Progress Notes (Signed)
Pt being discharged home via wheelchair with family. Pt alert and oriented x4. VSS. Pt c/o no pain at this time. No signs of respiratory distress. Education complete and care plans resolved. Pt sent home with PICC and IV infusion of antibiotics with pump. No further issues at this time. Pt to follow up with PCP. Leanne Chang, RN

## 2018-01-30 NOTE — Progress Notes (Signed)
Subjective: The patient is alert and pleasant.  She looks and feels better.  Objective: Vital signs in last 24 hours: Temp:  [98.3 F (36.8 C)-99.3 F (37.4 C)] 99.3 F (37.4 C) (06/10 0552) Pulse Rate:  [65-94] 77 (06/10 0552) Resp:  [14-18] 18 (06/10 0552) BP: (95-114)/(60-78) 96/60 (06/10 0552) SpO2:  [98 %-100 %] 98 % (06/10 0552) Estimated body mass index is 31.89 kg/m as calculated from the following:   Height as of this encounter: 5\' 3"  (1.6 m).   Weight as of this encounter: 81.6 kg (180 lb).   Intake/Output from previous day: 06/09 0701 - 06/10 0700 In: 2138 [P.O.:420; I.V.:618; IV Piggyback:1100] Out: -  Intake/Output this shift: Total I/O In: 200 [P.O.:200] Out: -   Physical exam the patient is alert and pleasant.  She is moving her lower extremities well.  Lab Results: Recent Labs    01/28/18 0633 01/30/18 0325  WBC 7.3 7.7  HGB 9.2* 8.7*  HCT 28.0* 26.5*  PLT 153 174   BMET Recent Labs    01/28/18 0633 01/30/18 0325  NA 136 135  K 3.9 4.0  CL 102 102  CO2 24 25  GLUCOSE 119* 223*  BUN 5* 6  CREATININE 0.82 0.96  CALCIUM 8.7* 8.4*    Studies/Results: [place] Ekg Site Rite  Result Date: 01/30/2018 If Site Rite image not attached, placement could not be confirmed due to current cardiac rhythm.   Assessment/Plan: Discitis: Cultures have grown streptococci.  ID has seen the patient.  A PICC line has been ordered.  Answered all the patient's questions.  She can follow-up with me on a as needed basis.  I will sign off.  Please call if I can be of further assistance.  LOS: 3 days     Ophelia Charter 01/30/2018, 11:35 AM

## 2018-01-30 NOTE — Care Management Note (Addendum)
Case Management Note Marvetta Gibbons RN, BSN Unit 4E-Case Manager-- cross coverage for [phone]  Patient Details  Name: [name] MRN: [id number] Date of Birth: [birth year]  Subjective/Objective:   Pt admitted with sepsis, diskitis                 Action/Plan: PTA pt lived at home with spouse- referral received for home IV abx needs. PICC line placed this am per ID. Plan for ampicillin through 03/24/18. Spoke with pt and spouse at bedside to discuss transition needs including home IV abx needs, and orders for HHRN/PT- choice offered and per pt/spouse they would like to use [facility] for services. Pt has cane at bedside no other DME needs identified. Referral called to Butch Penny with [facility] for HHRN/PT- awaiting return call from [facility] with [facility] for home IV abx needs and to discuss d/c plan for teaching.  1230- update- spoke with Pam from [facility]- she will f/u on home IV abx and hook pt up to pump prior to discharge in addition  do teaching at the bedside-provided Pam bedside RN- Heather's phone # to assist with coordinating d/c needs.   Expected Discharge Date:  01/30/18               Expected Discharge Plan:  [place]  In-House Referral:  NA  Discharge planning Services  CM Consult  Post Acute Care Choice:  Home Health Choice offered to:  Patient, Spouse  DME Arranged:    DME Agency:     HH Arranged:  RN, PT, IV Antibiotics HH Agency:  [facility]  Status of Service:  Completed, signed off  If discussed at [place] of Stay Meetings, dates discussed:    Discharge Disposition: home/home health   Additional Comments:  Dawayne Patricia, RN 01/30/2018, 12:07 PM

## 2018-01-30 NOTE — Progress Notes (Signed)
Peripherally Inserted Central Catheter/Midline Placement  The IV Nurse has discussed with the patient and/or persons authorized to consent for the patient, the purpose of this procedure and the potential benefits and risks involved with this procedure.  The benefits include less needle sticks, lab draws from the catheter, and the patient may be discharged home with the catheter. Risks include, but not limited to, infection, bleeding, blood clot (thrombus formation), and puncture of an artery; nerve damage and irregular heartbeat and possibility to perform a PICC exchange if needed/ordered by physician.  Alternatives to this procedure were also discussed.  Bard Power PICC patient education guide, fact sheet on infection prevention and patient information card has been provided to patient /or left at bedside.    PICC/Midline Placement Documentation  PICC Single Lumen 76/22/63 PICC Right Basilic 39 cm 0 cm (Active)  Indication for Insertion or Continuance of Line Home intravenous therapies (PICC only) 01/30/2018 11:39 AM  Exposed Catheter (cm) 0 cm 01/30/2018 11:39 AM  Site Assessment Clean;Dry;Intact 01/30/2018 11:39 AM  Line Status Saline locked;Blood return noted;Flushed 01/30/2018 11:39 AM  Dressing Type Transparent 01/30/2018 11:39 AM  Dressing Status Clean;Dry;Intact;Antimicrobial disc in place 01/30/2018 11:39 AM  Dressing Change Due 02/06/18 01/30/2018 11:39 AM       Ave Sch[place]st, Nicolette Bang 01/30/2018, 11:40 AM

## 2018-01-30 NOTE — Progress Notes (Signed)
[facility]   Reason for visit: Follow up on discitis  Interval History: repeat blood culture ngtd, on penicillin; WBC wnl, no fever.  No associated rash, diarrhea  Physical Exam: Constitutional:  Vitals:   01/29/18 2128 01/30/18 0552  BP: 114/78 96/60  Pulse: 94 77  Resp: 18 18  Temp: 98.4 F (36.9 C) 99.3 F (37.4 C)  SpO2: 100% 98%   patient appears in NAD HENT: no thrush Respiratory: Normal respiratory effort; CTA B Cardiovascular: RRR GI: soft, nt, nd  Review of Systems: Constitutional: negative for fevers and chills Gastrointestinal: negative for nausea and diarrhea Musculoskeletal: improved back pain  Lab Results  Component Value Date   WBC 7.7 01/30/2018   HGB 8.7 (L) 01/30/2018   HCT 26.5 (L) 01/30/2018   MCV 81.0 01/30/2018   PLT 174 01/30/2018    Lab Results  Component Value Date   CREATININE 0.96 01/30/2018   BUN 6 01/30/2018   NA 135 01/30/2018   K 4.0 01/30/2018   CL 102 01/30/2018   CO2 25 01/30/2018    Lab Results  Component Value Date   ALT 14 08/01/2013   AST 17 08/01/2013   ALKPHOS 64 08/01/2013     Microbiology: Recent Results (from the past 240 hour(s))  Blood culture (routine x 2)     Status: Abnormal   Collection Time: 01/26/18  8:45 PM  Result Value Ref Range Status   Specimen Description BLOOD LEFT ARM  Final   Special Requests   Final    BOTTLES DRAWN AEROBIC AND ANAEROBIC Blood Culture results may not be optimal due to an excessive volume of blood received in culture bottles   Culture  Setup Time   Final    GRAM POSITIVE COCCI IN CHAINS IN BOTH AEROBIC AND ANAEROBIC BOTTLES CRITICAL RESULT CALLED TO, READ BACK BY AND VERIFIED WITH: E MARTIN,PHARMD AT 1352 01/27/18 BY L BENFIELD Performed at [facility], [address] [place], [place] [address]    Culture VIRIDANS STREPTOCOCCUS (A)  Final   Report Status 01/29/2018 FINAL  Final   Organism ID, Bacteria VIRIDANS STREPTOCOCCUS  Final    Susceptibility   Viridans streptococcus - MIC*    PENICILLIN <=0.06 SENSITIVE Sensitive     CEFTRIAXONE <=0.12 SENSITIVE Sensitive     ERYTHROMYCIN 2 RESISTANT Resistant     LEVOFLOXACIN 0.5 SENSITIVE Sensitive     VANCOMYCIN 0.5 SENSITIVE Sensitive     * VIRIDANS STREPTOCOCCUS  Blood Culture ID Panel (Reflexed)     Status: Abnormal   Collection Time: 01/26/18  8:45 PM  Result Value Ref Range Status   Enterococcus species NOT DETECTED NOT DETECTED Final   Listeria monocytogenes NOT DETECTED NOT DETECTED Final   Staphylococcus species NOT DETECTED NOT DETECTED Final   Staphylococcus aureus NOT DETECTED NOT DETECTED Final   Streptococcus species DETECTED (A) NOT DETECTED Final    Comment: Not Enterococcus species, Streptococcus agalactiae, Streptococcus pyogenes, or Streptococcus pneumoniae. CRITICAL RESULT CALLED TO, READ BACK BY AND VERIFIED WITH: E MARTIN,PHARMD AT 1352 01/27/18 BY L BENFIELD    Streptococcus agalactiae NOT DETECTED NOT DETECTED Final   Streptococcus pneumoniae NOT DETECTED NOT DETECTED Final   Streptococcus pyogenes NOT DETECTED NOT DETECTED Final   Acinetobacter baumannii NOT DETECTED NOT DETECTED Final   Enterobacteriaceae species NOT DETECTED NOT DETECTED Final   Enterobacter cloacae complex NOT DETECTED NOT DETECTED Final   Escherichia coli NOT DETECTED NOT DETECTED Final   Klebsiella oxytoca NOT DETECTED  NOT DETECTED Final   Klebsiella pneumoniae NOT DETECTED NOT DETECTED Final   Proteus species NOT DETECTED NOT DETECTED Final   Serratia marcescens NOT DETECTED NOT DETECTED Final   Haemophilus influenzae NOT DETECTED NOT DETECTED Final   Neisseria meningitidis NOT DETECTED NOT DETECTED Final   Pseudomonas aeruginosa NOT DETECTED NOT DETECTED Final   Candida albicans NOT DETECTED NOT DETECTED Final   Candida glabrata NOT DETECTED NOT DETECTED Final   Candida krusei NOT DETECTED NOT DETECTED Final   Candida parapsilosis NOT DETECTED NOT DETECTED Final    Candida tropicalis NOT DETECTED NOT DETECTED Final    Comment: Performed at [facility], [place] [address] [place], [place] [address]  Blood culture (routine x 2)     Status: Abnormal   Collection Time: 01/26/18  9:00 PM  Result Value Ref Range Status   Specimen Description BLOOD RIGHT ARM  Final   Special Requests   Final    BOTTLES DRAWN AEROBIC AND ANAEROBIC Blood Culture results may not be optimal due to an excessive volume of blood received in culture bottles   Culture  Setup Time   Final    GRAM POSITIVE COCCI IN CHAINS IN BOTH AEROBIC AND ANAEROBIC BOTTLES CRITICAL RESULT CALLED TO, READ BACK BY AND VERIFIED WITH: E MARTIN,PHARMD AT 1352 01/27/18 BY L BENFIELD    Culture (A)  Final    VIRIDANS STREPTOCOCCUS SUSCEPTIBILITIES PERFORMED ON PREVIOUS CULTURE WITHIN THE LAST 5 DAYS. Performed at [facility], [place] [address], [place], [place] [address]    Report Status 01/29/2018 FINAL  Final  Culture, blood (routine x 2)     Status: None (Preliminary result)   Collection Time: 01/28/18  6:33 AM  Result Value Ref Range Status   Specimen Description BLOOD SITE NOT SPECIFIED  Final   Special Requests   Final    BOTTLES DRAWN AEROBIC ONLY Blood Culture results may not be optimal due to an inadequate volume of blood received in culture bottles   Culture   Final    NO GROWTH 1 DAY Performed at [facility], [place] [address] [place], [place] [address]    Report Status PENDING  Incomplete  Culture, blood (routine x 2)     Status: None (Preliminary result)   Collection Time: 01/28/18  6:36 AM  Result Value Ref Range Status   Specimen Description BLOOD SITE NOT SPECIFIED  Final   Special Requests   Final    BOTTLES DRAWN AEROBIC ONLY Blood Culture results may not be optimal due to an inadequate volume of blood received in culture bottles   Culture   Final    NO GROWTH 1 DAY Performed at [facility], [place] [address] [place], [place] [address]    Report Status  PENDING  Incomplete    Impression/Plan:  1. Discitis - continue with penicillin or ampicillin for 8 weeks through August 2nd. Patient overall feels improved.  Labs per home health I will arrange follow up in our clinic  2.  Access - ok today for picc line  3.  Bacteremia - likely from #1.  Repeat cultures ngtd.

## 2018-01-30 NOTE — Progress Notes (Signed)
PHARMACY CONSULT NOTE FOR: Ampicillin  OUTPATIENT  PARENTERAL ANTIBIOTIC THERAPY (OPAT)  Indication: discitis / osteomyelitis of lumbar spine Regimen: Ampicillin 12 gm continuous infusion over 24 hours. End date: Mar 24, 2018 (8 weeks of therapy)  IV antibiotic discharge orders are pended. To discharging provider:  please sign these orders via discharge navigator,  Select New Orders & click on the button choice - Manage This Unsigned Work.     Thank you for allowing pharmacy to be a part of this patient's care.  [employer], Pharm.D., BCPS Clinical Pharmacist Pager: [phone] Clinical phone for 01/30/2018 from 8:30-4:00 is [phone]. After 4pm, please call Main Rx [phone]) for assistance. 01/30/2018 10:47 AM

## 2018-01-30 NOTE — Progress Notes (Signed)
Inpatient Diabetes Program Recommendations  AACE/ADA: New Consensus Statement on Inpatient Glycemic Control (2015)  Target Ranges:  Prepandial:   less than 140 mg/dL      Peak postprandial:   less than 180 mg/dL (1-2 hours)      Critically ill patients:  140 - 180 mg/dL   Results for [name], [name] (MRN [id number]) as of 01/30/2018 11:46  Ref. Range 01/29/2018 07:36 01/29/2018 12:32 01/29/2018 16:57 01/29/2018 21:30 01/30/2018 08:06  Glucose-Capillary Latest Ref Range: 65 - 99 mg/dL 117 (H) 251 (H) 125 (H) 225 (H) 191 (H)   Review of Glycemic Control  Diabetes history: DM2 Outpatient Diabetes medications: Metformin 500 mg daily Current orders for Inpatient glycemic control: Novolog 0-15 units TID with meals, Novolog 0-5 units QHS  Inpatient Diabetes Program Recommendations: Insulin - Meal Coverage: Please consider ordering Novolog 3 units TID with meals for meal coverage if patient eats at least 50% of meals.  Thanks, Barnie Alderman, RN, MSN, CDE Diabetes Coordinator Inpatient Diabetes Program [phone] (Team Pager from 8am to 5pm)

## 2018-01-31 DIAGNOSIS — M4626 Osteomyelitis of vertebra, lumbar region: Secondary | ICD-10-CM | POA: Diagnosis not present

## 2018-01-31 DIAGNOSIS — I10 Essential (primary) hypertension: Secondary | ICD-10-CM | POA: Diagnosis not present

## 2018-01-31 DIAGNOSIS — Z792 Long term (current) use of antibiotics: Secondary | ICD-10-CM | POA: Diagnosis not present

## 2018-01-31 DIAGNOSIS — G8929 Other chronic pain: Secondary | ICD-10-CM | POA: Diagnosis not present

## 2018-01-31 DIAGNOSIS — D696 Thrombocytopenia, unspecified: Secondary | ICD-10-CM | POA: Diagnosis not present

## 2018-01-31 DIAGNOSIS — D649 Anemia, unspecified: Secondary | ICD-10-CM | POA: Diagnosis not present

## 2018-01-31 DIAGNOSIS — Z452 Encounter for adjustment and management of vascular access device: Secondary | ICD-10-CM | POA: Diagnosis not present

## 2018-01-31 DIAGNOSIS — Z5181 Encounter for therapeutic drug level monitoring: Secondary | ICD-10-CM | POA: Diagnosis not present

## 2018-01-31 DIAGNOSIS — E1169 Type 2 diabetes mellitus with other specified complication: Secondary | ICD-10-CM | POA: Diagnosis not present

## 2018-01-31 DIAGNOSIS — Z79891 Long term (current) use of opiate analgesic: Secondary | ICD-10-CM | POA: Diagnosis not present

## 2018-01-31 DIAGNOSIS — A409 Streptococcal sepsis, unspecified: Secondary | ICD-10-CM | POA: Diagnosis not present

## 2018-01-31 DIAGNOSIS — M5136 Other intervertebral disc degeneration, lumbar region: Secondary | ICD-10-CM | POA: Diagnosis not present

## 2018-01-31 DIAGNOSIS — Z7984 Long term (current) use of oral hypoglycemic drugs: Secondary | ICD-10-CM | POA: Diagnosis not present

## 2018-01-31 DIAGNOSIS — E785 Hyperlipidemia, unspecified: Secondary | ICD-10-CM | POA: Diagnosis not present

## 2018-01-31 DIAGNOSIS — E119 Type 2 diabetes mellitus without complications: Secondary | ICD-10-CM | POA: Diagnosis not present

## 2018-01-31 DIAGNOSIS — M4646 Discitis, unspecified, lumbar region: Secondary | ICD-10-CM | POA: Diagnosis not present

## 2018-01-31 NOTE — Discharge Summary (Signed)
Xiadani Damman FIE:[id number] DOB: 09-10-1965 DOA: 01/26/2018  PCP: Benito Mccreedy, MD  Admit date: 01/26/2018  Discharge date: 01/31/2018  Admitted From: Home   Disposition:  Home   Recommendations for Outpatient Follow-up:   Follow up with PCP in 1-2 weeks  PCP Please obtain BMP/CBC, 2 view CXR in 1week,  (see Discharge instructions)   PCP Please follow up on the following pending results: None   Home Health: RN Equipment/Devices: PICC Consultations: ID, N.Surg,  Discharge Condition: Stable   CODE STATUS: Full Diet Recommendation:  Diet Order    None     Heart Healthy    Chief Complaint  Patient presents with  . Back Pain     Brief history of present illness from the day of admission and additional interim summary    Patient is a 52 y.o. female with long-standing history of back pain, DM-2, hypertension presented with a few weeks history of worsening back pain and subjective fever.  She was seen by her outpatient pain management MD, who ordered a MRI of her lumbosacral spine (done at outside facility), and there was question of discitis around L5-S1 area-she was sent to the emergency room and subsequently admitted to the hospitalist service.                                                                   Hospital Course   Fever with back pain-suspected L5-S1 discitis: With streptococcal bacteremia, ID following currently on Rocephin, ESR and CRP are quite elevated, ID and neurosurgery on board, thoracic echocardiogram stable with a EF of 65%, IR does not think this is an abscess and do not think a CT-guided aspiration is required.  He was seen by ID case discussed with Dr. Alton Revere in detail plan is 8 weeks of IV ampicillin via PICC line.  With close outpatient ID and neurosurgery  follow-up.  Repeat surveillance cultures were drawn on 01/29/2018 along with procalcitonin and ESR along with CRP levels.  Surveillance cultures are so far negative, sed rate is significantly elevated at 116, CRP elevated at 11.2, calcitonin was stable at 0.18.   DM-2: Renew home regimen unchanged.  Hypertension: Blood pressure on home medications was running low normal hence metoprolol was continued but Norvasc and Avapro combination was stopped instead placed on 10 mg of lisinopril, PCP to monitor blood pressure and adjust meds as needed.  Dyslipidemia: Continue statin  Thrombocytopenia: Appears to be chronic-follow.  Anemia: Insignificant drop in hemoglobin-no evidence of blood loss-suspect could be from IV fluid dilution. Stable.  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  Resume follow-up with cardiothoracic surgery prn in the outpatient setting  ?  History of HOCM: Table echocardiogram she is already on beta-blocker.  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       Discharge diagnosis     Principal Problem:   Sepsis ([place]) Active Problems:   HTN (hypertension)   Diabetes ([place])   Cardiac murmur   Discitis of lumbar region   Thrombocytopenia [facility])    Discharge instructions    Discharge Instructions    Discharge instructions   Complete by:  As directed    Follow with Primary MD Benito Mccreedy, MD in 7 days   Get CBC, CMP, 2 view Chest X ray checked  by Primary MD  MD in 5-7 days   Activity: As tolerated with Full fall precautions use walker/cane & assistance as needed  Disposition Home   Diet:   Heart healthy low carbohydrate  For Heart failure patients - Check your Weight same time everyday, if you gain over 2 pounds, or you develop in leg swelling, experience more shortness of breath or chest pain, call your Primary MD immediately. Follow  Cardiac Low Salt Diet and 1.5 lit/day fluid restriction.  Special Instructions: If you have smoked or chewed Tobacco  in the last 2 yrs please stop smoking, stop any regular Alcohol  and or any Recreational drug use.  On your next visit with your primary care physician please Get Medicines reviewed and adjusted.  Please request your Prim.MD to go over all Hospital Tests and Procedure/Radiological results at the follow up, please get all Hospital records sent to your Prim MD by signing hospital release before you go home.  If you experience worsening of your admission symptoms, develop shortness of breath, life threatening emergency, suicidal or homicidal thoughts you must seek medical attention immediately by calling 911 or calling your MD immediately  if symptoms less severe.  You Must read complete instructions/literature along with all the possible adverse reactions/side effects for all the Medicines you take and that have been prescribed to you. Take any new Medicines after you have completely understood and accpet all the possible adverse reactions/side effects.   Home infusion instructions [facility] May follow [place] Dosing Protocol; May administer Cathflo as needed to maintain patency of vascular access device.; Flushing of vascular access device: per [facility] Protocol: 0.9% NaCl pre/post medica...   Complete by:  As directed    Instructions:  May follow [place] Dosing Protocol   Instructions:  May administer Cathflo as needed to maintain patency of vascular access device.   Instructions:  Flushing of vascular access device: per [facility] Protocol: 0.9% NaCl pre/post medication administration and prn patency; Heparin 100 u/ml, 46m for implanted ports and Heparin 10u/ml, 561mfor all other central venous catheters.   Instructions:  May follow [facility] Anaphylaxis Protocol for First Dose Administration in the home: 0.9% NaCl at 25-50 ml/hr to maintain IV access for protocol meds. Epinephrine  0.3 ml IV/IM PRN and Benadryl 25-50 IV/IM PRN s/s of anaphylaxis.   Instructions:  [facility] Coordinator (RN) to assist per patient IV care needs in the home PRN.   Increase activity slowly   Complete by:  As directed       Discharge Medications   Allergies as of 01/30/2018   No Known Allergies     Medication List    STOP taking these medications   AZOR 10-40 MG tablet Generic drug:  amLODipine-olmesartan     TAKE these medications   ampicillin IVPB Inject 12 g into the vein daily. Ampicillin Continuous Infusion - 12 grams to infuse over 24 hours.  Indication:  Discitis / osteomyelitis of lumbar spine Last Day of Therapy:  03/24/18 Labs - Once weekly:  CBC/D and BMP, Labs - Every other week:  ESR and CRP   aspirin 81 MG tablet Take 81 mg by mouth daily.   calcium-vitamin D 500-200 MG-UNIT tablet Take 1 tablet by mouth 2 (two) times daily with a meal.   cholecalciferol 1000 units tablet Commonly known as:  VITAMIN D Take 1,000 Units by mouth daily.   lisinopril 10 MG tablet Commonly known as:  PRINIVIL,ZESTRIL Take 1 tablet (10 mg total) by mouth daily.   meloxicam 15 MG tablet Commonly known as:  MOBIC Take 15 mg by mouth daily.   metFORMIN 500 MG tablet Commonly known as:  GLUCOPHAGE Take 500 mg by mouth daily.   methocarbamol 500 MG tablet Commonly known as:  ROBAXIN Take 500 mg by mouth 2 (two) times daily.   metoprolol succinate 25 MG 24 hr tablet Commonly known as:  TOPROL-XL Take 12.5 mg by mouth daily.   oxyCODONE-acetaminophen 10-325 MG tablet Commonly known as:  PERCOCET Take 1 tablet by mouth 3 (three) times daily.   pregabalin 50 MG capsule Commonly known as:  LYRICA Take 50 mg by mouth 3 (three) times daily.   rosuvastatin 20 MG tablet Commonly known as:  CRESTOR Take 20 mg by mouth daily.            Home Infusion Instuctions  (From admission, onward)        Start     Ordered   01/30/18 0000  Home infusion  instructions [facility] May follow [place] Dosing Protocol; May administer Cathflo as needed to maintain patency of vascular access device.; Flushing of vascular access device: per [facility] Protocol: 0.9% NaCl pre/post medica...    Question Answer Comment  Instructions May follow [place] Dosing Protocol   Instructions May administer Cathflo as needed to maintain patency of vascular access device.   Instructions Flushing of vascular access device: per [facility] Protocol: 0.9% NaCl pre/post medication administration and prn patency; Heparin 100 u/ml, 60m for implanted ports and Heparin 10u/ml, 537mfor all other central venous catheters.   Instructions May follow [facility] Anaphylaxis Protocol for First Dose Administration in the home: 0.9% NaCl at 25-50 ml/hr to maintain IV access for protocol meds. Epinephrine 0.3 ml IV/IM PRN and Benadryl 25-50 IV/IM PRN s/s of anaphylaxis.   Instructions [facility] Infusion Coordinator (RN) to assist per patient IV care needs in the home PRN.      01/30/18 1105      Follow-up Information    Osei-Bonsu, GeIona BeardMD. Schedule an appointment as soon as possible for a visit in 1 week(s).   Specialty:  Internal Medicine Contact information: [address] [address] [phone]        CoThayer HeadingsMD. Schedule an appointment as soon as possible for a visit in 2 week(s).   Specialty:  Infectious Diseases Contact information: [phone]        JeNewman PiesMD. Schedule an appointment as soon as possible for a visit in 2 week(s).   Specialty:  Neurosurgery Contact information: [address]  [place] [phone]      [facility], [facility]-Home Follow up.   Specialty:  Home Health Services Why:  HHRN/PT (RN will f/u for IV abx needs and PICC line care) they will call you to  set up home PT visits Contact  information: [address] [place] [address] [phone]           Major procedures and Radiology Reports - PLEASE review detailed and final reports thoroughly  -     PICC 01/30/18   Dg Chest Port 1 View  Result Date: 01/27/2018 CLINICAL DATA:  Cough. EXAM: PORTABLE CHEST 1 VIEW COMPARISON:  CT chest 06/27/2014.  Chest x-ray 03/26/2012. FINDINGS: 0623 hours. Stable mild asymmetric elevation right hemidiaphragm. The lungs are clear without focal pneumonia, edema, pneumothorax or pleural effusion. The cardio pericardial silhouette is enlarged. The visualized bony structures of the thorax are intact. Telemetry leads overlie the chest. IMPRESSION: No active disease. Electronically Signed   By: Misty Stanley M.D.   On: 01/27/2018 07:01   [place] Ekg Site Rite  Result Date: 01/30/2018 If Site Rite image not attached, placement could not be confirmed due to current cardiac rhythm.   Micro Results    Recent Results (from the past 240 hour(s))  Blood culture (routine x 2)     Status: Abnormal   Collection Time: 01/26/18  8:45 PM  Result Value Ref Range Status   Specimen Description BLOOD LEFT ARM  Final   Special Requests   Final    BOTTLES DRAWN AEROBIC AND ANAEROBIC Blood Culture results may not be optimal due to an excessive volume of blood received in culture bottles   Culture  Setup Time   Final    GRAM POSITIVE COCCI IN CHAINS IN BOTH AEROBIC AND ANAEROBIC BOTTLES CRITICAL RESULT CALLED TO, READ BACK BY AND VERIFIED WITH: E MARTIN,PHARMD AT 1352 01/27/18 BY L BENFIELD Performed at [facility], [place] [address] [place], [place] [address]    Culture VIRIDANS STREPTOCOCCUS (A)  Final   Report Status 01/29/2018 FINAL  Final   Organism ID, Bacteria VIRIDANS STREPTOCOCCUS  Final      Susceptibility   Viridans streptococcus - MIC*    PENICILLIN <=0.06 SENSITIVE Sensitive     CEFTRIAXONE <=0.12 SENSITIVE Sensitive     ERYTHROMYCIN 2 RESISTANT Resistant     LEVOFLOXACIN  0.5 SENSITIVE Sensitive     VANCOMYCIN 0.5 SENSITIVE Sensitive     * VIRIDANS STREPTOCOCCUS  Blood Culture ID Panel (Reflexed)     Status: Abnormal   Collection Time: 01/26/18  8:45 PM  Result Value Ref Range Status   Enterococcus species NOT DETECTED NOT DETECTED Final   Listeria monocytogenes NOT DETECTED NOT DETECTED Final   Staphylococcus species NOT DETECTED NOT DETECTED Final   Staphylococcus aureus NOT DETECTED NOT DETECTED Final   Streptococcus species DETECTED (A) NOT DETECTED Final    Comment: Not Enterococcus species, Streptococcus agalactiae, Streptococcus pyogenes, or Streptococcus pneumoniae. CRITICAL RESULT CALLED TO, READ BACK BY AND VERIFIED WITH: E MARTIN,PHARMD AT 1352 01/27/18 BY L BENFIELD    Streptococcus agalactiae NOT DETECTED NOT DETECTED Final   Streptococcus pneumoniae NOT DETECTED NOT DETECTED Final   Streptococcus pyogenes NOT DETECTED NOT DETECTED Final   Acinetobacter baumannii NOT DETECTED NOT DETECTED Final   Enterobacteriaceae species NOT DETECTED NOT DETECTED Final   Enterobacter cloacae complex NOT DETECTED NOT DETECTED Final   Escherichia coli NOT DETECTED NOT DETECTED Final   Klebsiella oxytoca NOT DETECTED NOT DETECTED Final   Klebsiella pneumoniae NOT DETECTED NOT DETECTED Final   Proteus species NOT DETECTED NOT DETECTED Final   Serratia marcescens NOT DETECTED NOT DETECTED Final   Haemophilus influenzae NOT DETECTED NOT DETECTED Final   Neisseria meningitidis NOT  DETECTED NOT DETECTED Final   Pseudomonas aeruginosa NOT DETECTED NOT DETECTED Final   Candida albicans NOT DETECTED NOT DETECTED Final   Candida glabrata NOT DETECTED NOT DETECTED Final   Candida krusei NOT DETECTED NOT DETECTED Final   Candida parapsilosis NOT DETECTED NOT DETECTED Final   Candida tropicalis NOT DETECTED NOT DETECTED Final    Comment: Performed at [facility], [place] [address], [place], [place] [address]  Blood culture (routine x 2)     Status: Abnormal    Collection Time: 01/26/18  9:00 PM  Result Value Ref Range Status   Specimen Description BLOOD RIGHT ARM  Final   Special Requests   Final    BOTTLES DRAWN AEROBIC AND ANAEROBIC Blood Culture results may not be optimal due to an excessive volume of blood received in culture bottles   Culture  Setup Time   Final    GRAM POSITIVE COCCI IN CHAINS IN BOTH AEROBIC AND ANAEROBIC BOTTLES CRITICAL RESULT CALLED TO, READ BACK BY AND VERIFIED WITH: E MARTIN,PHARMD AT 1352 01/27/18 BY L BENFIELD    Culture (A)  Final    VIRIDANS STREPTOCOCCUS SUSCEPTIBILITIES PERFORMED ON PREVIOUS CULTURE WITHIN THE LAST 5 DAYS. Performed at [facility], [place] [address], [place], [place] [address]    Report Status 01/29/2018 FINAL  Final  Culture, blood (routine x 2)     Status: None (Preliminary result)   Collection Time: 01/28/18  6:33 AM  Result Value Ref Range Status   Specimen Description BLOOD SITE NOT SPECIFIED  Final   Special Requests   Final    BOTTLES DRAWN AEROBIC ONLY Blood Culture results may not be optimal due to an inadequate volume of blood received in culture bottles   Culture   Final    NO GROWTH 3 DAYS Performed at [facility], [place] [address], [place], [place] [address]    Report Status PENDING  Incomplete  Culture, blood (routine x 2)     Status: None (Preliminary result)   Collection Time: 01/28/18  6:36 AM  Result Value Ref Range Status   Specimen Description BLOOD SITE NOT SPECIFIED  Final   Special Requests   Final    BOTTLES DRAWN AEROBIC ONLY Blood Culture results may not be optimal due to an inadequate volume of blood received in culture bottles   Culture   Final    NO GROWTH 3 DAYS Performed at [facility], [place] [address] [place], [place] [address]    Report Status PENDING  Incomplete    Today   Subjective    Hoang Reich today has no headache,no chest abdominal pain,no new weakness tingling or numbness, feels much better wants to go home  today.Dull low back pain.   Objective   Blood pressure 97/69, pulse 88, temperature 98.4 F (36.9 C), temperature source Oral, resp. rate 17, height '5\' 3"'$  (1.6 m), weight 81.6 kg (180 lb), last menstrual period 05/18/2012, SpO2 97 %.   Intake/Output Summary (Last 24 hours) at 01/31/2018 1325 Last data filed at 01/30/2018 1500 Gross per 24 hour  Intake 300 ml  Output -  Net 300 ml    Exam Awake Alert, Oriented x 3, No new F.N deficits, Normal affect [place].AT,PERRAL Supple Neck,No JVD, No cervical lymphadenopathy appriciated.  Symmetrical Chest wall movement, Good air movement bilaterally, CTAB RRR,No Gallops,Rubs or new Murmurs, No Parasternal Heave +ve B.Sounds, Abd Soft, Non tender, No organomegaly appriciated, No rebound -guarding or rigidity. No Cyanosis, Clubbing  or edema, No new Rash or bruise   Data Review   CBC w Diff:  Lab Results  Component Value Date   WBC 7.7 01/30/2018   HGB 8.7 (L) 01/30/2018   HCT 26.5 (L) 01/30/2018   PLT 174 01/30/2018   LYMPHOPCT 20 01/27/2018   MONOPCT 11 01/27/2018   EOSPCT 0 01/27/2018   BASOPCT 0 01/27/2018    CMP:  Lab Results  Component Value Date   NA 135 01/30/2018   K 4.0 01/30/2018   CL 102 01/30/2018   CO2 25 01/30/2018   BUN 6 01/30/2018   CREATININE 0.96 01/30/2018   PROT 8.0 08/01/2013   ALBUMIN 3.9 08/01/2013   BILITOT 0.6 08/01/2013   ALKPHOS 64 08/01/2013   AST 17 08/01/2013   ALT 14 08/01/2013  .   Total Time in preparing paper work, data evaluation and todays exam - 24 minutes  Lala Lund M.D on 01/31/2018 at 1:25 PM  [facility]   Office  [phone]

## 2018-02-01 DIAGNOSIS — M4626 Osteomyelitis of vertebra, lumbar region: Secondary | ICD-10-CM | POA: Diagnosis not present

## 2018-02-01 DIAGNOSIS — G8929 Other chronic pain: Secondary | ICD-10-CM | POA: Diagnosis not present

## 2018-02-01 DIAGNOSIS — Z792 Long term (current) use of antibiotics: Secondary | ICD-10-CM | POA: Diagnosis not present

## 2018-02-01 DIAGNOSIS — E1169 Type 2 diabetes mellitus with other specified complication: Secondary | ICD-10-CM | POA: Diagnosis not present

## 2018-02-01 DIAGNOSIS — E119 Type 2 diabetes mellitus without complications: Secondary | ICD-10-CM | POA: Diagnosis not present

## 2018-02-01 DIAGNOSIS — Z5181 Encounter for therapeutic drug level monitoring: Secondary | ICD-10-CM | POA: Diagnosis not present

## 2018-02-01 DIAGNOSIS — M5136 Other intervertebral disc degeneration, lumbar region: Secondary | ICD-10-CM | POA: Diagnosis not present

## 2018-02-01 DIAGNOSIS — I10 Essential (primary) hypertension: Secondary | ICD-10-CM | POA: Diagnosis not present

## 2018-02-01 DIAGNOSIS — D649 Anemia, unspecified: Secondary | ICD-10-CM | POA: Diagnosis not present

## 2018-02-01 DIAGNOSIS — A409 Streptococcal sepsis, unspecified: Secondary | ICD-10-CM | POA: Diagnosis not present

## 2018-02-01 DIAGNOSIS — M4646 Discitis, unspecified, lumbar region: Secondary | ICD-10-CM | POA: Diagnosis not present

## 2018-02-01 DIAGNOSIS — Z452 Encounter for adjustment and management of vascular access device: Secondary | ICD-10-CM | POA: Diagnosis not present

## 2018-02-01 DIAGNOSIS — E785 Hyperlipidemia, unspecified: Secondary | ICD-10-CM | POA: Diagnosis not present

## 2018-02-01 DIAGNOSIS — D696 Thrombocytopenia, unspecified: Secondary | ICD-10-CM | POA: Diagnosis not present

## 2018-02-02 DIAGNOSIS — G8929 Other chronic pain: Secondary | ICD-10-CM | POA: Diagnosis not present

## 2018-02-02 DIAGNOSIS — M4626 Osteomyelitis of vertebra, lumbar region: Secondary | ICD-10-CM | POA: Diagnosis not present

## 2018-02-02 DIAGNOSIS — M5136 Other intervertebral disc degeneration, lumbar region: Secondary | ICD-10-CM | POA: Diagnosis not present

## 2018-02-02 DIAGNOSIS — I10 Essential (primary) hypertension: Secondary | ICD-10-CM | POA: Diagnosis not present

## 2018-02-02 DIAGNOSIS — E785 Hyperlipidemia, unspecified: Secondary | ICD-10-CM | POA: Diagnosis not present

## 2018-02-02 DIAGNOSIS — Z792 Long term (current) use of antibiotics: Secondary | ICD-10-CM | POA: Diagnosis not present

## 2018-02-02 DIAGNOSIS — E1169 Type 2 diabetes mellitus with other specified complication: Secondary | ICD-10-CM | POA: Diagnosis not present

## 2018-02-02 DIAGNOSIS — D696 Thrombocytopenia, unspecified: Secondary | ICD-10-CM | POA: Diagnosis not present

## 2018-02-02 DIAGNOSIS — Z452 Encounter for adjustment and management of vascular access device: Secondary | ICD-10-CM | POA: Diagnosis not present

## 2018-02-02 DIAGNOSIS — A409 Streptococcal sepsis, unspecified: Secondary | ICD-10-CM | POA: Diagnosis not present

## 2018-02-02 DIAGNOSIS — E119 Type 2 diabetes mellitus without complications: Secondary | ICD-10-CM | POA: Diagnosis not present

## 2018-02-02 DIAGNOSIS — Z5181 Encounter for therapeutic drug level monitoring: Secondary | ICD-10-CM | POA: Diagnosis not present

## 2018-02-02 DIAGNOSIS — D649 Anemia, unspecified: Secondary | ICD-10-CM | POA: Diagnosis not present

## 2018-02-02 DIAGNOSIS — M4646 Discitis, unspecified, lumbar region: Secondary | ICD-10-CM | POA: Diagnosis not present

## 2018-02-02 LAB — CULTURE, BLOOD (ROUTINE X 2)
CULTURE: NO GROWTH
Culture: NO GROWTH

## 2018-02-03 DIAGNOSIS — M4646 Discitis, unspecified, lumbar region: Secondary | ICD-10-CM | POA: Diagnosis not present

## 2018-02-03 DIAGNOSIS — E119 Type 2 diabetes mellitus without complications: Secondary | ICD-10-CM | POA: Diagnosis not present

## 2018-02-04 DIAGNOSIS — M4646 Discitis, unspecified, lumbar region: Secondary | ICD-10-CM | POA: Diagnosis not present

## 2018-02-04 DIAGNOSIS — E119 Type 2 diabetes mellitus without complications: Secondary | ICD-10-CM | POA: Diagnosis not present

## 2018-02-06 DIAGNOSIS — M5136 Other intervertebral disc degeneration, lumbar region: Secondary | ICD-10-CM | POA: Diagnosis not present

## 2018-02-06 DIAGNOSIS — Z792 Long term (current) use of antibiotics: Secondary | ICD-10-CM | POA: Diagnosis not present

## 2018-02-06 DIAGNOSIS — M4640 Discitis, unspecified, site unspecified: Secondary | ICD-10-CM | POA: Diagnosis not present

## 2018-02-06 DIAGNOSIS — Z452 Encounter for adjustment and management of vascular access device: Secondary | ICD-10-CM | POA: Diagnosis not present

## 2018-02-06 DIAGNOSIS — I10 Essential (primary) hypertension: Secondary | ICD-10-CM | POA: Diagnosis not present

## 2018-02-06 DIAGNOSIS — E785 Hyperlipidemia, unspecified: Secondary | ICD-10-CM | POA: Diagnosis not present

## 2018-02-06 DIAGNOSIS — G8929 Other chronic pain: Secondary | ICD-10-CM | POA: Diagnosis not present

## 2018-02-06 DIAGNOSIS — D696 Thrombocytopenia, unspecified: Secondary | ICD-10-CM | POA: Diagnosis not present

## 2018-02-06 DIAGNOSIS — E1169 Type 2 diabetes mellitus with other specified complication: Secondary | ICD-10-CM | POA: Diagnosis not present

## 2018-02-06 DIAGNOSIS — E119 Type 2 diabetes mellitus without complications: Secondary | ICD-10-CM | POA: Diagnosis not present

## 2018-02-06 DIAGNOSIS — M4646 Discitis, unspecified, lumbar region: Secondary | ICD-10-CM | POA: Diagnosis not present

## 2018-02-06 DIAGNOSIS — Z5181 Encounter for therapeutic drug level monitoring: Secondary | ICD-10-CM | POA: Diagnosis not present

## 2018-02-06 DIAGNOSIS — D649 Anemia, unspecified: Secondary | ICD-10-CM | POA: Diagnosis not present

## 2018-02-06 DIAGNOSIS — A409 Streptococcal sepsis, unspecified: Secondary | ICD-10-CM | POA: Diagnosis not present

## 2018-02-06 DIAGNOSIS — M4626 Osteomyelitis of vertebra, lumbar region: Secondary | ICD-10-CM | POA: Diagnosis not present

## 2018-02-07 DIAGNOSIS — A409 Streptococcal sepsis, unspecified: Secondary | ICD-10-CM | POA: Diagnosis not present

## 2018-02-07 DIAGNOSIS — Z5181 Encounter for therapeutic drug level monitoring: Secondary | ICD-10-CM | POA: Diagnosis not present

## 2018-02-07 DIAGNOSIS — G8929 Other chronic pain: Secondary | ICD-10-CM | POA: Diagnosis not present

## 2018-02-07 DIAGNOSIS — D696 Thrombocytopenia, unspecified: Secondary | ICD-10-CM | POA: Diagnosis not present

## 2018-02-07 DIAGNOSIS — Z452 Encounter for adjustment and management of vascular access device: Secondary | ICD-10-CM | POA: Diagnosis not present

## 2018-02-07 DIAGNOSIS — M4646 Discitis, unspecified, lumbar region: Secondary | ICD-10-CM | POA: Diagnosis not present

## 2018-02-07 DIAGNOSIS — Z792 Long term (current) use of antibiotics: Secondary | ICD-10-CM | POA: Diagnosis not present

## 2018-02-07 DIAGNOSIS — K219 Gastro-esophageal reflux disease without esophagitis: Secondary | ICD-10-CM | POA: Diagnosis not present

## 2018-02-07 DIAGNOSIS — E119 Type 2 diabetes mellitus without complications: Secondary | ICD-10-CM | POA: Diagnosis not present

## 2018-02-07 DIAGNOSIS — E785 Hyperlipidemia, unspecified: Secondary | ICD-10-CM | POA: Diagnosis not present

## 2018-02-07 DIAGNOSIS — M5136 Other intervertebral disc degeneration, lumbar region: Secondary | ICD-10-CM | POA: Diagnosis not present

## 2018-02-07 DIAGNOSIS — G609 Hereditary and idiopathic neuropathy, unspecified: Secondary | ICD-10-CM | POA: Diagnosis not present

## 2018-02-07 DIAGNOSIS — D649 Anemia, unspecified: Secondary | ICD-10-CM | POA: Diagnosis not present

## 2018-02-07 DIAGNOSIS — M4626 Osteomyelitis of vertebra, lumbar region: Secondary | ICD-10-CM | POA: Diagnosis not present

## 2018-02-07 DIAGNOSIS — I10 Essential (primary) hypertension: Secondary | ICD-10-CM | POA: Diagnosis not present

## 2018-02-07 DIAGNOSIS — E1169 Type 2 diabetes mellitus with other specified complication: Secondary | ICD-10-CM | POA: Diagnosis not present

## 2018-02-08 DIAGNOSIS — E119 Type 2 diabetes mellitus without complications: Secondary | ICD-10-CM | POA: Diagnosis not present

## 2018-02-08 DIAGNOSIS — M4646 Discitis, unspecified, lumbar region: Secondary | ICD-10-CM | POA: Diagnosis not present

## 2018-02-09 DIAGNOSIS — G8929 Other chronic pain: Secondary | ICD-10-CM | POA: Diagnosis not present

## 2018-02-09 DIAGNOSIS — E1169 Type 2 diabetes mellitus with other specified complication: Secondary | ICD-10-CM | POA: Diagnosis not present

## 2018-02-09 DIAGNOSIS — A409 Streptococcal sepsis, unspecified: Secondary | ICD-10-CM | POA: Diagnosis not present

## 2018-02-09 DIAGNOSIS — E785 Hyperlipidemia, unspecified: Secondary | ICD-10-CM | POA: Diagnosis not present

## 2018-02-09 DIAGNOSIS — M4626 Osteomyelitis of vertebra, lumbar region: Secondary | ICD-10-CM | POA: Diagnosis not present

## 2018-02-09 DIAGNOSIS — M4646 Discitis, unspecified, lumbar region: Secondary | ICD-10-CM | POA: Diagnosis not present

## 2018-02-09 DIAGNOSIS — Z792 Long term (current) use of antibiotics: Secondary | ICD-10-CM | POA: Diagnosis not present

## 2018-02-09 DIAGNOSIS — M5136 Other intervertebral disc degeneration, lumbar region: Secondary | ICD-10-CM | POA: Diagnosis not present

## 2018-02-09 DIAGNOSIS — Z452 Encounter for adjustment and management of vascular access device: Secondary | ICD-10-CM | POA: Diagnosis not present

## 2018-02-09 DIAGNOSIS — D696 Thrombocytopenia, unspecified: Secondary | ICD-10-CM | POA: Diagnosis not present

## 2018-02-09 DIAGNOSIS — D649 Anemia, unspecified: Secondary | ICD-10-CM | POA: Diagnosis not present

## 2018-02-09 DIAGNOSIS — Z5181 Encounter for therapeutic drug level monitoring: Secondary | ICD-10-CM | POA: Diagnosis not present

## 2018-02-09 DIAGNOSIS — I10 Essential (primary) hypertension: Secondary | ICD-10-CM | POA: Diagnosis not present

## 2018-02-09 DIAGNOSIS — E119 Type 2 diabetes mellitus without complications: Secondary | ICD-10-CM | POA: Diagnosis not present

## 2018-02-10 ENCOUNTER — Other Ambulatory Visit: Payer: Self-pay | Admitting: Pharmacist

## 2018-02-10 DIAGNOSIS — M4646 Discitis, unspecified, lumbar region: Secondary | ICD-10-CM | POA: Diagnosis not present

## 2018-02-10 DIAGNOSIS — E119 Type 2 diabetes mellitus without complications: Secondary | ICD-10-CM | POA: Diagnosis not present

## 2018-02-10 NOTE — Progress Notes (Signed)
[facility] pharmacy lab review  

## 2018-02-11 DIAGNOSIS — E119 Type 2 diabetes mellitus without complications: Secondary | ICD-10-CM | POA: Diagnosis not present

## 2018-02-11 DIAGNOSIS — M4646 Discitis, unspecified, lumbar region: Secondary | ICD-10-CM | POA: Diagnosis not present

## 2018-02-13 ENCOUNTER — Encounter: Payer: Self-pay | Admitting: Internal Medicine

## 2018-02-13 DIAGNOSIS — M4626 Osteomyelitis of vertebra, lumbar region: Secondary | ICD-10-CM | POA: Diagnosis not present

## 2018-02-13 DIAGNOSIS — D649 Anemia, unspecified: Secondary | ICD-10-CM | POA: Diagnosis not present

## 2018-02-13 DIAGNOSIS — I10 Essential (primary) hypertension: Secondary | ICD-10-CM | POA: Diagnosis not present

## 2018-02-13 DIAGNOSIS — Z792 Long term (current) use of antibiotics: Secondary | ICD-10-CM | POA: Diagnosis not present

## 2018-02-13 DIAGNOSIS — A409 Streptococcal sepsis, unspecified: Secondary | ICD-10-CM | POA: Diagnosis not present

## 2018-02-13 DIAGNOSIS — M5136 Other intervertebral disc degeneration, lumbar region: Secondary | ICD-10-CM | POA: Diagnosis not present

## 2018-02-13 DIAGNOSIS — D696 Thrombocytopenia, unspecified: Secondary | ICD-10-CM | POA: Diagnosis not present

## 2018-02-13 DIAGNOSIS — E785 Hyperlipidemia, unspecified: Secondary | ICD-10-CM | POA: Diagnosis not present

## 2018-02-13 DIAGNOSIS — E119 Type 2 diabetes mellitus without complications: Secondary | ICD-10-CM | POA: Diagnosis not present

## 2018-02-13 DIAGNOSIS — E1169 Type 2 diabetes mellitus with other specified complication: Secondary | ICD-10-CM | POA: Diagnosis not present

## 2018-02-13 DIAGNOSIS — G8929 Other chronic pain: Secondary | ICD-10-CM | POA: Diagnosis not present

## 2018-02-13 DIAGNOSIS — Z452 Encounter for adjustment and management of vascular access device: Secondary | ICD-10-CM | POA: Diagnosis not present

## 2018-02-13 DIAGNOSIS — Z5181 Encounter for therapeutic drug level monitoring: Secondary | ICD-10-CM | POA: Diagnosis not present

## 2018-02-13 DIAGNOSIS — M4646 Discitis, unspecified, lumbar region: Secondary | ICD-10-CM | POA: Diagnosis not present

## 2018-02-14 ENCOUNTER — Encounter: Payer: Self-pay | Admitting: Internal Medicine

## 2018-02-14 ENCOUNTER — Ambulatory Visit: Payer: Federal, State, Local not specified - PPO | Admitting: Internal Medicine

## 2018-02-14 VITALS — BP 165/92 | HR 73 | Temp 98.2°F | Wt 170.0 lb

## 2018-02-14 DIAGNOSIS — Z5181 Encounter for therapeutic drug level monitoring: Secondary | ICD-10-CM | POA: Insufficient documentation

## 2018-02-14 DIAGNOSIS — B373 Candidiasis of vulva and vagina: Secondary | ICD-10-CM | POA: Diagnosis not present

## 2018-02-14 DIAGNOSIS — R112 Nausea with vomiting, unspecified: Secondary | ICD-10-CM | POA: Diagnosis not present

## 2018-02-14 DIAGNOSIS — E119 Type 2 diabetes mellitus without complications: Secondary | ICD-10-CM | POA: Diagnosis not present

## 2018-02-14 DIAGNOSIS — M4646 Discitis, unspecified, lumbar region: Secondary | ICD-10-CM

## 2018-02-14 DIAGNOSIS — B3731 Acute candidiasis of vulva and vagina: Secondary | ICD-10-CM | POA: Insufficient documentation

## 2018-02-14 MED ORDER — FLUCONAZOLE 150 MG PO TABS: 150.0000 mg | ORAL_TABLET | Freq: Once | ORAL | 3 refills | Status: AC

## 2018-02-14 MED ORDER — ONDANSETRON 4 MG PO TBDP: 4.0000 mg | ORAL_TABLET | Freq: Three times a day (TID) | ORAL | 2 refills | Status: DC | PRN

## 2018-02-14 NOTE — Assessment & Plan Note (Signed)
Probably from the ampicillin Will give zofran.

## 2018-02-14 NOTE — Assessment & Plan Note (Signed)
Fluconazole given with refills

## 2018-02-14 NOTE — Assessment & Plan Note (Signed)
Improving so will continue with ampicillin.  If the N/V keeps getting worse and not improved with medication can change to ceftriaxone 2 grams once daily.

## 2018-02-14 NOTE — Assessment & Plan Note (Signed)
Creat wnl last week.  Continue weekly labs.

## 2018-02-14 NOTE — Progress Notes (Signed)
   Subjective:    Patient ID: [name], female    DOB: April 26, 1966, 52 y.o.   MRN: [id number]  HPI Here for Hsfu On ampicillin for Strep viridans discitis/osteomyelitis.  Back pain improved.  No associated rash or diarrhea.  Some nausea and occasional vomiting.  Pleased overall with the improvement.  Currently with a yeast infection   Review of Systems  Constitutional: Negative for fatigue.  Gastrointestinal: Negative for diarrhea.  Skin: Negative for rash.       Objective:   Physical Exam  Constitutional: She appears well-nourished. No distress.  Eyes: No scleral icterus.  Cardiovascular: Normal rate, regular rhythm and normal heart sounds.  No murmur heard. Pulmonary/Chest: Effort normal and breath sounds normal. No respiratory distress.  Skin: No rash noted.   SH: no tobacco       Assessment & Plan:

## 2018-02-15 DIAGNOSIS — M4646 Discitis, unspecified, lumbar region: Secondary | ICD-10-CM | POA: Diagnosis not present

## 2018-02-15 DIAGNOSIS — E785 Hyperlipidemia, unspecified: Secondary | ICD-10-CM | POA: Diagnosis not present

## 2018-02-15 DIAGNOSIS — E119 Type 2 diabetes mellitus without complications: Secondary | ICD-10-CM | POA: Diagnosis not present

## 2018-02-15 DIAGNOSIS — M4626 Osteomyelitis of vertebra, lumbar region: Secondary | ICD-10-CM | POA: Diagnosis not present

## 2018-02-15 DIAGNOSIS — D649 Anemia, unspecified: Secondary | ICD-10-CM | POA: Diagnosis not present

## 2018-02-15 DIAGNOSIS — M5136 Other intervertebral disc degeneration, lumbar region: Secondary | ICD-10-CM | POA: Diagnosis not present

## 2018-02-15 DIAGNOSIS — Z792 Long term (current) use of antibiotics: Secondary | ICD-10-CM | POA: Diagnosis not present

## 2018-02-15 DIAGNOSIS — Z5181 Encounter for therapeutic drug level monitoring: Secondary | ICD-10-CM | POA: Diagnosis not present

## 2018-02-15 DIAGNOSIS — A409 Streptococcal sepsis, unspecified: Secondary | ICD-10-CM | POA: Diagnosis not present

## 2018-02-15 DIAGNOSIS — E1169 Type 2 diabetes mellitus with other specified complication: Secondary | ICD-10-CM | POA: Diagnosis not present

## 2018-02-15 DIAGNOSIS — I10 Essential (primary) hypertension: Secondary | ICD-10-CM | POA: Diagnosis not present

## 2018-02-15 DIAGNOSIS — Z452 Encounter for adjustment and management of vascular access device: Secondary | ICD-10-CM | POA: Diagnosis not present

## 2018-02-15 DIAGNOSIS — G8929 Other chronic pain: Secondary | ICD-10-CM | POA: Diagnosis not present

## 2018-02-15 DIAGNOSIS — D696 Thrombocytopenia, unspecified: Secondary | ICD-10-CM | POA: Diagnosis not present

## 2018-02-16 DIAGNOSIS — Z5181 Encounter for therapeutic drug level monitoring: Secondary | ICD-10-CM | POA: Diagnosis not present

## 2018-02-16 DIAGNOSIS — M4626 Osteomyelitis of vertebra, lumbar region: Secondary | ICD-10-CM | POA: Diagnosis not present

## 2018-02-16 DIAGNOSIS — Z452 Encounter for adjustment and management of vascular access device: Secondary | ICD-10-CM | POA: Diagnosis not present

## 2018-02-16 DIAGNOSIS — D696 Thrombocytopenia, unspecified: Secondary | ICD-10-CM | POA: Diagnosis not present

## 2018-02-16 DIAGNOSIS — E119 Type 2 diabetes mellitus without complications: Secondary | ICD-10-CM | POA: Diagnosis not present

## 2018-02-16 DIAGNOSIS — A409 Streptococcal sepsis, unspecified: Secondary | ICD-10-CM | POA: Diagnosis not present

## 2018-02-16 DIAGNOSIS — M5136 Other intervertebral disc degeneration, lumbar region: Secondary | ICD-10-CM | POA: Diagnosis not present

## 2018-02-16 DIAGNOSIS — E785 Hyperlipidemia, unspecified: Secondary | ICD-10-CM | POA: Diagnosis not present

## 2018-02-16 DIAGNOSIS — G8929 Other chronic pain: Secondary | ICD-10-CM | POA: Diagnosis not present

## 2018-02-16 DIAGNOSIS — I10 Essential (primary) hypertension: Secondary | ICD-10-CM | POA: Diagnosis not present

## 2018-02-16 DIAGNOSIS — M4646 Discitis, unspecified, lumbar region: Secondary | ICD-10-CM | POA: Diagnosis not present

## 2018-02-16 DIAGNOSIS — E1169 Type 2 diabetes mellitus with other specified complication: Secondary | ICD-10-CM | POA: Diagnosis not present

## 2018-02-16 DIAGNOSIS — Z792 Long term (current) use of antibiotics: Secondary | ICD-10-CM | POA: Diagnosis not present

## 2018-02-16 DIAGNOSIS — D649 Anemia, unspecified: Secondary | ICD-10-CM | POA: Diagnosis not present

## 2018-02-17 ENCOUNTER — Other Ambulatory Visit: Payer: Self-pay | Admitting: Pharmacist

## 2018-02-17 DIAGNOSIS — M4646 Discitis, unspecified, lumbar region: Secondary | ICD-10-CM | POA: Diagnosis not present

## 2018-02-17 DIAGNOSIS — E119 Type 2 diabetes mellitus without complications: Secondary | ICD-10-CM | POA: Diagnosis not present

## 2018-02-17 NOTE — Progress Notes (Signed)
[facility] pharmacy lab review  

## 2018-02-18 DIAGNOSIS — M4646 Discitis, unspecified, lumbar region: Secondary | ICD-10-CM | POA: Diagnosis not present

## 2018-02-18 DIAGNOSIS — E119 Type 2 diabetes mellitus without complications: Secondary | ICD-10-CM | POA: Diagnosis not present

## 2018-02-19 ENCOUNTER — Inpatient Hospital Stay (HOSPITAL_BASED_OUTPATIENT_CLINIC_OR_DEPARTMENT_OTHER)
Admission: EM | Admit: 2018-02-19 | Discharge: 2018-02-25 | DRG: 607 | Disposition: A | Discharge status: Home Health | Payer: Federal, State, Local not specified - PPO | Attending: Internal Medicine | Admitting: Internal Medicine

## 2018-02-19 ENCOUNTER — Encounter (HOSPITAL_BASED_OUTPATIENT_CLINIC_OR_DEPARTMENT_OTHER): Payer: Self-pay | Admitting: *Deleted

## 2018-02-19 ENCOUNTER — Other Ambulatory Visit: Payer: Self-pay

## 2018-02-19 DIAGNOSIS — L270 Generalized skin eruption due to drugs and medicaments taken internally: Principal | ICD-10-CM | POA: Diagnosis present

## 2018-02-19 DIAGNOSIS — Z833 Family history of diabetes mellitus: Secondary | ICD-10-CM

## 2018-02-19 DIAGNOSIS — Z7984 Long term (current) use of oral hypoglycemic drugs: Secondary | ICD-10-CM | POA: Diagnosis not present

## 2018-02-19 DIAGNOSIS — T7840XA Allergy, unspecified, initial encounter: Secondary | ICD-10-CM

## 2018-02-19 DIAGNOSIS — I10 Essential (primary) hypertension: Secondary | ICD-10-CM | POA: Diagnosis not present

## 2018-02-19 DIAGNOSIS — D509 Iron deficiency anemia, unspecified: Secondary | ICD-10-CM | POA: Diagnosis not present

## 2018-02-19 DIAGNOSIS — Z791 Long term (current) use of non-steroidal anti-inflammatories (NSAID): Secondary | ICD-10-CM | POA: Diagnosis not present

## 2018-02-19 DIAGNOSIS — M4647 Discitis, unspecified, lumbosacral region: Secondary | ICD-10-CM | POA: Diagnosis present

## 2018-02-19 DIAGNOSIS — Z8249 Family history of ischemic heart disease and other diseases of the circulatory system: Secondary | ICD-10-CM

## 2018-02-19 DIAGNOSIS — M4626 Osteomyelitis of vertebra, lumbar region: Secondary | ICD-10-CM | POA: Diagnosis not present

## 2018-02-19 DIAGNOSIS — L299 Pruritus, unspecified: Secondary | ICD-10-CM | POA: Diagnosis not present

## 2018-02-19 DIAGNOSIS — T360X5A Adverse effect of penicillins, initial encounter: Secondary | ICD-10-CM | POA: Diagnosis not present

## 2018-02-19 DIAGNOSIS — E119 Type 2 diabetes mellitus without complications: Secondary | ICD-10-CM | POA: Diagnosis not present

## 2018-02-19 DIAGNOSIS — R21 Rash and other nonspecific skin eruption: Secondary | ICD-10-CM | POA: Diagnosis not present

## 2018-02-19 DIAGNOSIS — Z792 Long term (current) use of antibiotics: Secondary | ICD-10-CM

## 2018-02-19 DIAGNOSIS — L500 Allergic urticaria: Secondary | ICD-10-CM | POA: Diagnosis not present

## 2018-02-19 DIAGNOSIS — E1169 Type 2 diabetes mellitus with other specified complication: Secondary | ICD-10-CM | POA: Diagnosis not present

## 2018-02-19 DIAGNOSIS — Z7982 Long term (current) use of aspirin: Secondary | ICD-10-CM

## 2018-02-19 DIAGNOSIS — Z79891 Long term (current) use of opiate analgesic: Secondary | ICD-10-CM

## 2018-02-19 DIAGNOSIS — M4646 Discitis, unspecified, lumbar region: Secondary | ICD-10-CM | POA: Diagnosis not present

## 2018-02-19 DIAGNOSIS — A491 Streptococcal infection, unspecified site: Secondary | ICD-10-CM

## 2018-02-19 DIAGNOSIS — Y92009 Unspecified place in unspecified non-institutional (private) residence as the place of occurrence of the external cause: Secondary | ICD-10-CM

## 2018-02-19 DIAGNOSIS — R7881 Bacteremia: Secondary | ICD-10-CM | POA: Diagnosis present

## 2018-02-19 HISTORY — DX: Type 2 diabetes mellitus without complications: E11.9

## 2018-02-19 MED ORDER — METHOCARBAMOL 500 MG PO TABS
500.0000 mg | ORAL_TABLET | Freq: Two times a day (BID) | ORAL | Status: DC
  Administered 2018-02-19 – 2018-02-22 (×6): 500 mg via ORAL
  Filled 2018-02-19 (×6): qty 1

## 2018-02-19 MED ORDER — VANCOMYCIN HCL 10 G IV SOLR
1500.0000 mg | INTRAVENOUS | Status: AC
  Administered 2018-02-19: 1500 mg via INTRAVENOUS
  Filled 2018-02-19: qty 1500

## 2018-02-19 MED ORDER — DIPHENHYDRAMINE HCL 25 MG PO CAPS
25.0000 mg | ORAL_CAPSULE | Freq: Four times a day (QID) | ORAL | Status: DC | PRN
  Administered 2018-02-19 – 2018-02-21 (×4): 25 mg via ORAL
  Filled 2018-02-19 (×4): qty 1

## 2018-02-19 MED ORDER — MELOXICAM 15 MG PO TABS
15.0000 mg | ORAL_TABLET | Freq: Every day | ORAL | Status: DC
  Administered 2018-02-19 – 2018-02-22 (×4): 15 mg via ORAL
  Filled 2018-02-19 (×4): qty 1

## 2018-02-19 MED ORDER — ENOXAPARIN SODIUM 40 MG/0.4ML ~~LOC~~ SOLN
40.0000 mg | SUBCUTANEOUS | Status: DC
  Administered 2018-02-19 – 2018-02-24 (×6): 40 mg via SUBCUTANEOUS
  Filled 2018-02-19 (×6): qty 0.4

## 2018-02-19 MED ORDER — ONDANSETRON 4 MG PO TBDP: 4.0000 mg | ORAL_TABLET | Freq: Three times a day (TID) | ORAL | Status: DC | PRN

## 2018-02-19 MED ORDER — ASPIRIN 81 MG PO CHEW
81.0000 mg | CHEWABLE_TABLET | Freq: Every day | ORAL | Status: DC
  Administered 2018-02-19 – 2018-02-25 (×7): 81 mg via ORAL
  Filled 2018-02-19 (×7): qty 1

## 2018-02-19 MED ORDER — LISINOPRIL 10 MG PO TABS
10.0000 mg | ORAL_TABLET | Freq: Every day | ORAL | Status: DC
  Administered 2018-02-19 – 2018-02-20 (×2): 10 mg via ORAL
  Filled 2018-02-19 (×2): qty 1

## 2018-02-19 MED ORDER — OXYCODONE HCL 5 MG PO TABS
5.0000 mg | ORAL_TABLET | Freq: Three times a day (TID) | ORAL | Status: DC
  Administered 2018-02-19 – 2018-02-25 (×18): 5 mg via ORAL
  Filled 2018-02-19 (×18): qty 1

## 2018-02-19 MED ORDER — METFORMIN HCL 500 MG PO TABS
500.0000 mg | ORAL_TABLET | Freq: Every day | ORAL | Status: DC
  Administered 2018-02-19 – 2018-02-21 (×3): 500 mg via ORAL
  Filled 2018-02-19 (×3): qty 1

## 2018-02-19 MED ORDER — SODIUM CHLORIDE 0.9% FLUSH
10.0000 mL | INTRAVENOUS | Status: DC | PRN
  Administered 2018-02-19: 10 mL
  Administered 2018-02-24: 20 mL
  Administered 2018-02-25: 10 mL
  Filled 2018-02-19 (×3): qty 40

## 2018-02-19 MED ORDER — OXYCODONE-ACETAMINOPHEN 5-325 MG PO TABS
2.0000 | ORAL_TABLET | Freq: Once | ORAL | Status: AC
  Administered 2018-02-19: 2 via ORAL
  Filled 2018-02-19: qty 2

## 2018-02-19 MED ORDER — DIPHENHYDRAMINE HCL 50 MG/ML IJ SOLN: 12.5000 mg | Freq: Four times a day (QID) | INTRAMUSCULAR | Status: DC | PRN

## 2018-02-19 MED ORDER — OXYCODONE-ACETAMINOPHEN 5-325 MG PO TABS
1.0000 | ORAL_TABLET | Freq: Three times a day (TID) | ORAL | Status: DC
  Administered 2018-02-19 – 2018-02-25 (×18): 1 via ORAL
  Filled 2018-02-19 (×18): qty 1

## 2018-02-19 MED ORDER — METOPROLOL SUCCINATE ER 25 MG PO TB24
12.5000 mg | ORAL_TABLET | Freq: Every day | ORAL | Status: DC
  Administered 2018-02-19 – 2018-02-24 (×6): 12.5 mg via ORAL
  Filled 2018-02-19 (×6): qty 1

## 2018-02-19 MED ORDER — OXYCODONE-ACETAMINOPHEN 10-325 MG PO TABS: 1.0000 | ORAL_TABLET | Freq: Three times a day (TID) | ORAL | Status: DC

## 2018-02-19 MED ORDER — VANCOMYCIN HCL 10 G IV SOLR
1250.0000 mg | INTRAVENOUS | Status: DC
  Administered 2018-02-20 – 2018-02-24 (×5): 1250 mg via INTRAVENOUS
  Filled 2018-02-19 (×6): qty 1250

## 2018-02-19 MED ORDER — FAMOTIDINE IN NACL 20-0.9 MG/50ML-% IV SOLN
20.0000 mg | Freq: Once | INTRAVENOUS | Status: AC
Start: 2018-02-19 — End: 2018-02-19
  Administered 2018-02-19: 20 mg via INTRAVENOUS
  Filled 2018-02-19: qty 50

## 2018-02-19 MED ORDER — ROSUVASTATIN CALCIUM 20 MG PO TABS
20.0000 mg | ORAL_TABLET | Freq: Every day | ORAL | Status: DC
  Administered 2018-02-19 – 2018-02-24 (×6): 20 mg via ORAL
  Filled 2018-02-19 (×6): qty 1

## 2018-02-19 MED ORDER — DIPHENHYDRAMINE HCL 25 MG PO CAPS
25.0000 mg | ORAL_CAPSULE | Freq: Once | ORAL | Status: AC
  Administered 2018-02-19: 25 mg via ORAL
  Filled 2018-02-19: qty 1

## 2018-02-19 MED ORDER — PREGABALIN 50 MG PO CAPS
50.0000 mg | ORAL_CAPSULE | Freq: Three times a day (TID) | ORAL | Status: DC
  Administered 2018-02-19 – 2018-02-22 (×9): 50 mg via ORAL
  Filled 2018-02-19 (×10): qty 1

## 2018-02-19 MED ORDER — DIPHENHYDRAMINE HCL 50 MG/ML IJ SOLN
25.0000 mg | Freq: Once | INTRAMUSCULAR | Status: AC
Start: 2018-02-19 — End: 2018-02-19
  Administered 2018-02-19: 25 mg via INTRAVENOUS
  Filled 2018-02-19: qty 1

## 2018-02-19 MED ORDER — METHYLPREDNISOLONE SODIUM SUCC 125 MG IJ SOLR
125.0000 mg | Freq: Once | INTRAMUSCULAR | Status: AC
Start: 2018-02-19 — End: 2018-02-19
  Administered 2018-02-19: 125 mg via INTRAVENOUS
  Filled 2018-02-19: qty 2

## 2018-02-19 NOTE — ED Notes (Signed)
ED Provider at bedside. 

## 2018-02-19 NOTE — ED Notes (Signed)
Report to [place], Therapist, sports at [employer].

## 2018-02-19 NOTE — ED Triage Notes (Addendum)
Pt had a recent hospital admission for an "infection to her bone" has been home times 2 weeks. States on Thursday she started developing a general red rash. States she had taken one dose of diflucan and is on an IV antibiotic through her PICC line. Denies sob. Has taken benadryl without relief. Swelling noted to left hand noted, pt states this is new since last night.

## 2018-02-19 NOTE — Progress Notes (Signed)
Pharmacy Antibiotic Note  [name] is a 52 y.o. female admitted on 02/19/2018 with allergic reaction to antibiotic.  Patient had been on IV ampicillin at home for discitis/osteomyelitis and presents with allergic reaction (rash and itching).  ID recommends changing to Vancomycin. Pharmacy has been consulted for Vancomycin dosing.  Plan:  Vancomycin 1500mg  IV x 1 loading dose followed by 1250mg  IV q24h (Goal AUC 400-500)  Follow renal function  Check levels when appropriate  Height: 5\' 4"  (162.6 cm) Weight: 170 lb (77.1 kg) IBW/kg (Calculated) : 54.7  Temp (24hrs), Avg:98.9 F (37.2 C), Min:98.8 F (37.1 C), Max:99 F (37.2 C)  No results for input(s): WBC, CREATININE, LATICACIDVEN, VANCOTROUGH, VANCOPEAK, VANCORANDOM, GENTTROUGH, GENTPEAK, GENTRANDOM, TOBRATROUGH, TOBRAPEAK, TOBRARND, AMIKACINPEAK, AMIKACINTROU, AMIKACIN in the last 168 hours.  Estimated Creatinine Clearance: 69.7 mL/min (by C-G formula based on SCr of 0.96 mg/dL).    Allergies  Allergen Reactions  . Ampicillin Hives and Itching    Antimicrobials this admission: 6/30 vanc >>    Dose adjustments this admission:    Microbiology results:  Thank you for allowing pharmacy to be a part of this patient's care.  Everette Rank, PharmD 02/19/2018 5:04 PM

## 2018-02-19 NOTE — H&P (Signed)
History and Physical    [name] SWH:[id number] DOB: [birth year]  DOA: 02/19/2018 PCP: Benito Mccreedy, MD  Patient coming from: [facility]  Chief Complaint: Hives  HPI: [name] is a 52 y.o. female with medical history significant of hypertension, diabetes type 2, recently discharged due to strep viridans's discitis/osteomyelitis.  Patient presented to the emergency department complaining of itching, rash and swelling.  Patient reported the symptoms started 2 days ago where she noted some redness around the PICC line, subsequently had high in her abdomen and left hand swelling.  Patient denies difficulty breathing no difficulty swallowing or wheezing.  Patient has been treated with ampicillin IV through PICC line for the past 2 weeks and was prescribed Diflucan 1 day prior to symptoms.  ED evaluation patient was noted to be hypertensive, otherwise labs reassuring.  Patient with diffuse urticaria, see pictures.  Patient was treated with Solu-Medrol, IV Benadryl and IV Pepcid with some improvement however rash still present.  EDP discussed case with infectious disease felt that this was not due to Diflucan, however likely to be ampicillin.  Recommended transition to vancomycin and coordination of care for antibiotic administration.  Patient placed in obs for abx coordination.   Review of Systems:   General: no changes in body weight, no fever chills or decrease in energy.  HEENT: no blurry vision, hearing changes or sore throat Respiratory: no dyspnea, coughing, wheezing CV: no chest pain, no palpitations GI: no nausea, vomiting, abdominal pain, diarrhea, constipation GU: no dysuria, burning on urination, increased urinary frequency, hematuria  Ext:. No deformities,  Neuro: no unilateral weakness, numbness, or tingling, no vision change or hearing loss Skin: See HPI  MSK: No muscle spasm, no deformity, no limitation of range of movement in spin Heme: No easy bruising.  Travel  history: No recent long distant travel.  Past Medical History:  Diagnosis Date  . Arthritis 2008   Lumbar Deg Disc Disease  . Chest pain   . HTN (hypertension)   . Migraine   . 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 No pertinent surgical history.   reports that she has never smoked. She has never used smokeless tobacco. She reports that she does not drink alcohol or use drugs.  Allergies  Allergen Reactions  . Ampicillin Hives and Itching    Family History  Problem Relation Age of Onset  . Diabetes Mother   . Hypertension Mother     Family history reviewed and not pertinent   Prior to Admission medications   Medication Sig Start Date End Date Taking? Authorizing Provider  ampicillin IVPB Inject 12 g into the vein daily. Ampicillin Continuous Infusion - 12 grams to infuse over 24 hours. Indication:  Discitis / osteomyelitis of lumbar spine Last Day of Therapy:  03/24/18 Labs - Once weekly:  CBC/D and BMP, Labs - Every other week:  ESR and CRP 01/30/18 03/24/18  Thurnell Lose, MD  aspirin 81 MG tablet Take 81 mg by mouth daily.      [provider]  Calcium Carbonate-Vitamin D (CALCIUM-VITAMIN D) 500-200 MG-UNIT per tablet Take 1 tablet by mouth 2 (two) times daily with a meal.      [provider]  cholecalciferol (VITAMIN D) 1000 UNITS tablet Take 1,000 Units by mouth daily.    [provider]  lisinopril (PRINIVIL,ZESTRIL) 10 MG tablet Take 1 tablet (10 mg total) by mouth daily. 01/30/18 01/30/19  Thurnell Lose, MD  meloxicam (MOBIC) 15 MG tablet Take 15 mg  by mouth daily. 01/12/18   [provider]  metFORMIN (GLUCOPHAGE) 500 MG tablet Take 500 mg by mouth daily.  11/22/17   [provider]  methocarbamol (ROBAXIN) 500 MG tablet Take 500 mg by mouth 2 (two) times daily. 01/02/18   [provider]  metoprolol succinate (TOPROL-XL) 25 MG 24 hr tablet Take 12.5 mg by mouth daily. 11/22/17   [provider]    ondansetron (ZOFRAN-ODT) 4 MG disintegrating tablet Take 1 tablet (4 mg total) by mouth every 8 (eight) hours as needed for nausea or vomiting. 02/14/18   Comer, Okey Regal, MD  oxyCODONE-acetaminophen (PERCOCET) 10-325 MG tablet Take 1 tablet by mouth 3 (three) times daily. 01/12/18   [provider]  pregabalin (LYRICA) 50 MG capsule Take 50 mg by mouth 3 (three) times daily.     [provider]  rosuvastatin (CRESTOR) 20 MG tablet Take 20 mg by mouth daily. 11/22/17   [provider]    Physical Exam: Vitals:   02/19/18 1100 02/19/18 1230 02/19/18 1300 02/19/18 1553  BP: (!) 158/97 (!) 160/98 (!) 155/98 (!) 167/96  Pulse: 72 81 79 91  Resp: '19 17 16 18  '$ Temp:    99 F (37.2 C)  TempSrc:    Oral  SpO2: 95% 95% 96% 98%  Weight:      Height:         Constitutional: NAD, calm, comfortable Eyes: PERRL, lids and conjunctivae normal ENMT: Mucous membranes are moist. Posterior pharynx clear of any exudate or lesions. Neck: normal, supple, no masses, no thyromegaly Respiratory: clear to auscultation bilaterally, no wheezing, no crackles. Normal respiratory effort. Cardiovascular: Regular rate and rhythm, no murmurs. No extremity edema. 2+ pedal pulses. Abdomen: no tenderness, no masses palpated. No hepatosplenomegaly. Bowel sounds positive.  Musculoskeletal: no clubbing / cyanosis.Good ROM, PICC line in the upper right extremity Skin: Mild rash on inner left thigh, mild erythema around the PICC line, abdominal hives has improved. Neurologic: CN 2-12 grossly intact.   Psychiatric: Alert and oriented x 3. Normal mood.    Labs on Admission: I have personally reviewed following labs and imaging studies  CBC: No results for input(s): WBC, NEUTROABS, HGB, HCT, MCV, PLT in the last 168 hours. Basic Metabolic Panel: No results for input(s): NA, K, CL, CO2, GLUCOSE, BUN, CREATININE, CALCIUM, MG, PHOS in the last 168 hours. GFR: Estimated Creatinine Clearance: 69.7  mL/min (by C-G formula based on SCr of 0.96 mg/dL). Liver Function Tests: No results for input(s): AST, ALT, ALKPHOS, BILITOT, PROT, ALBUMIN in the last 168 hours. No results for input(s): LIPASE, AMYLASE in the last 168 hours. No results for input(s): AMMONIA in the last 168 hours. Coagulation Profile: No results for input(s): INR, PROTIME in the last 168 hours. Cardiac Enzymes: No results for input(s): CKTOTAL, CKMB, CKMBINDEX, TROPONINI in the last 168 hours. BNP (last 3 results) No results for input(s): PROBNP in the last 8760 hours. HbA1C: No results for input(s): HGBA1C in the last 72 hours. CBG: No results for input(s): GLUCAP in the last 168 hours. Lipid Profile: No results for input(s): CHOL, HDL, LDLCALC, TRIG, CHOLHDL, LDLDIRECT in the last 72 hours. Thyroid Function Tests: No results for input(s): TSH, T4TOTAL, FREET4, T3FREE, THYROIDAB in the last 72 hours. Anemia Panel: No results for input(s): VITAMINB12, FOLATE, FERRITIN, TIBC, IRON, RETICCTPCT in the last 72 hours. Urine analysis:    Component Value Date/Time   COLORURINE YELLOW 08/01/2013 2111   APPEARANCEUR CLEAR 08/01/2013 2111   LABSPEC 1.008  08/01/2013 2111   PHURINE 6.0 08/01/2013 2111   GLUCOSEU NEGATIVE 08/01/2013 2111   HGBUR SMALL (A) 08/01/2013 2111   BILIRUBINUR NEGATIVE 08/01/2013 2111   [place] 08/01/2013 2111   PROTEINUR NEGATIVE 08/01/2013 2111   UROBILINOGEN 0.2 08/01/2013 2111   NITRITE NEGATIVE 08/01/2013 2111   LEUKOCYTESUR SMALL (A) 08/01/2013 2111   Sepsis Labs: !!!!!!!!!!!!!!!!!!!!!!!!!!!!!!!!!!!!!!!!!!!! '@LABRCNTIP'$ (procalcitonin:4,lacticidven:4) )No results found for this or any previous visit (from the past 240 hour(s)).   Radiological Exams on Admission: No results found.  Assessment/Plan Allergic reaction  Apparently reaction to penicillin, however multiple medications that could cause allergies which includes lisinopril, aspirin and meloxicam.  Discontinue  ampicillin for now as is the newest medication, patient was treated with Benadryl Pepcid and Solu-Medrol.  Some improvement noted.  We will continue Benadryl as needed.  Continue to monitor  Strep viridans discitis/osteomyelitis Patient was on a course of IV ampicillin. Case discussed with ID recommended to switch to vancomycin, monitor Vanco trough and renal function.  Will place case management consult to arrange home health antibiotic changes.  Diabetes mellitus type 2 Stable continue metformin  Hypertension BP stable, continue lisinopril.  DVT prophylaxis: Lovenox Code Status: Full code Family Communication: Husband at bedside Disposition Plan: Anticipate discharge to previous home environment.  Consults called: As discussed with ID Admission status: Medsurg/obs   Chipper Oman MD [facility] Pager: Text Page via [email or web address]  [phone]  If 7PM-7AM, please contact night-coverage [email or web address] Password [facility]  02/19/2018, 4:43 PM

## 2018-02-19 NOTE — ED Notes (Signed)
PICC infusion discontinued per v.o. Dr. Sherry Ruffing.

## 2018-02-19 NOTE — Progress Notes (Signed)
Dr Elon Jester called and advised that patient on unit. Dr Elon Jester states confirms patient does not need cardiac monitoring; order dc'd.

## 2018-02-19 NOTE — ED Provider Notes (Signed)
[name] EMERGENCY DEPARTMENT Provider Note   CSN: [id number] Arrival date & time: 02/19/18  1601     History   Chief Complaint Chief Complaint  Patient presents with  . allergic reaction    HPI [name] is a 52 y.o. female.  The history is provided by the patient, medical records and the spouse.  Allergic Reaction  Presenting symptoms: itching, rash and swelling   Presenting symptoms: no difficulty breathing, no difficulty swallowing and no wheezing   Severity:  Moderate Duration:  3 days Prior allergic episodes:  No prior episodes Context: medications (ampicillin IV through picc)   Relieved by:  Nothing Worsened by:  Nothing Ineffective treatments:  Antihistamines   Past Medical History:  Diagnosis Date  . Arthritis 2008   Lumbar Deg Disc Disease  . Chest pain   . HTN (hypertension)   . Migraine   . Thymic cyst [facility])     Patient Active Problem List   Diagnosis Date Noted  . Medication monitoring encounter 02/14/2018  . Nausea & vomiting 02/14/2018  . Vaginal yeast infection 02/14/2018  . Cardiac murmur 01/27/2018  . Discitis of lumbar region 01/27/2018  . Thrombocytopenia ([place]) 01/27/2018  . Metatarsal deformity 01/01/2013  . Diabetes ([place]) 01/01/2013  . 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 DDD (degenerative disc disease), lumbar 03/03/2012  . Obesity (BMI 30.0-34.9) 03/03/2012  . HTN (hypertension)   . Migraine     History reviewed. No pertinent surgical history.   OB History   None      Home Medications    Prior to Admission medications   Medication Sig Start Date End Date Taking? Authorizing Provider  ampicillin IVPB Inject 12 g into the vein daily. Ampicillin Continuous Infusion - 12 grams to infuse over 24 hours. Indication:  Discitis / osteomyelitis of lumbar spine Last Day of Therapy:  03/24/18 Labs - Once weekly:  CBC/D and BMP, Labs - Every other week:  ESR and CRP 01/30/18 03/24/18  Thurnell Lose, MD  aspirin 81 MG  tablet Take 81 mg by mouth daily.      [provider]  Calcium Carbonate-Vitamin D (CALCIUM-VITAMIN D) 500-200 MG-UNIT per tablet Take 1 tablet by mouth 2 (two) times daily with a meal.      [provider]  cholecalciferol (VITAMIN D) 1000 UNITS tablet Take 1,000 Units by mouth daily.    [provider]  lisinopril (PRINIVIL,ZESTRIL) 10 MG tablet Take 1 tablet (10 mg total) by mouth daily. 01/30/18 01/30/19  Thurnell Lose, MD  meloxicam (MOBIC) 15 MG tablet Take 15 mg by mouth daily. 01/12/18   [provider]  metFORMIN (GLUCOPHAGE) 500 MG tablet Take 500 mg by mouth daily.  11/22/17   [provider]  methocarbamol (ROBAXIN) 500 MG tablet Take 500 mg by mouth 2 (two) times daily. 01/02/18   [provider]  metoprolol succinate (TOPROL-XL) 25 MG 24 hr tablet Take 12.5 mg by mouth daily. 11/22/17   [provider]  ondansetron (ZOFRAN-ODT) 4 MG disintegrating tablet Take 1 tablet (4 mg total) by mouth every 8 (eight) hours as needed for nausea or vomiting. 02/14/18   Comer, Okey Regal, MD  oxyCODONE-acetaminophen (PERCOCET) 10-325 MG tablet Take 1 tablet by mouth 3 (three) times daily. 01/12/18   [provider]  pregabalin (LYRICA) 50 MG capsule Take 50 mg by mouth 3 (three) times daily.     [provider]  rosuvastatin (CRESTOR) 20 MG tablet Take 20 mg by mouth daily.  11/22/17   [provider]    Family History Family History  Problem Relation Age of Onset  . Diabetes Mother   . Hypertension Mother     Social History Social History   Tobacco Use  . Smoking status: Never Smoker  . Smokeless tobacco: Never Used  Substance Use Topics  . Alcohol use: No  . Drug use: No     Allergies   Patient has no known allergies.   Review of Systems Review of Systems  Constitutional: Negative for chills, diaphoresis, fatigue and fever.  HENT: Positive for sore throat. Negative for ear pain and trouble  swallowing.   Eyes: Negative for pain and visual disturbance.  Respiratory: Negative for cough, chest tightness, shortness of breath and wheezing.   Cardiovascular: Negative for chest pain, palpitations and leg swelling.  Gastrointestinal: Negative for abdominal pain, constipation, diarrhea, nausea and vomiting.  Genitourinary: Negative for dysuria, frequency, genital sores and hematuria.  Musculoskeletal: Negative for arthralgias and back pain.  Skin: Positive for itching and rash. Negative for color change and wound.  Neurological: Negative for seizures, syncope and numbness.  Psychiatric/Behavioral: Negative for agitation.  All other systems reviewed and are negative.    Physical Exam Updated Vital Signs BP (!) 167/111 (BP Location: Left Arm)   Pulse 86   Temp 98.8 F (37.1 C) (Oral)   Resp 20   Ht '5\' 4"'$  (1.626 m)   Wt 77.1 kg (170 lb)   LMP 05/18/2012   SpO2 97%   BMI 29.18 kg/m   Physical Exam  Constitutional: She appears well-developed and well-nourished. No distress.  HENT:  Head: Normocephalic and atraumatic.  Nose: Nose normal.  Mouth/Throat: Oropharynx is clear and moist. No oropharyngeal exudate.  Eyes: Pupils are equal, round, and reactive to light. Conjunctivae and EOM are normal.  Neck: Normal range of motion. Neck supple.  Cardiovascular: Normal rate and regular rhythm.  No murmur heard. Pulmonary/Chest: Effort normal and breath sounds normal. No respiratory distress. She has no wheezes. She exhibits no tenderness.  Abdominal: Soft. There is no tenderness.  Musculoskeletal: She exhibits edema. She exhibits no tenderness.  Lymphadenopathy:    She has no cervical adenopathy.  Neurological: She is alert.  Skin: Skin is warm and dry. Capillary refill takes less than 2 seconds. Rash noted. Rash is urticarial. She is not diaphoretic.     Psychiatric: She has a normal mood and affect.  Nursing note and vitals reviewed.        ED Treatments / Results    Labs (all labs ordered are listed, but only abnormal results are displayed) Labs Reviewed - No data to display  EKG None  Radiology No results found.  Procedures Procedures (including critical care time)  Medications Ordered in ED Medications  methylPREDNISolone sodium succinate (SOLU-MEDROL) 125 mg/2 mL injection 125 mg (125 mg Intravenous Given 02/19/18 0817)  diphenhydrAMINE (BENADRYL) injection 25 mg (25 mg Intravenous Given 02/19/18 0816)  famotidine (PEPCID) IVPB 20 mg premix (0 mg Intravenous Stopped 02/19/18 0849)     Initial Impression / Assessment and Plan / ED Course  I have reviewed the triage vital signs and the nursing notes.  Pertinent labs & imaging results that were available during my care of the patient were reviewed by me and considered in my medical decision making (see chart for details).     [name] is a 52 y.o. female with past medical history significant for hypertension, diabetes, and recent diagnosis of discitis/osteomyelitis with a PICC line and  receiving constant ampicillin therapy who presents with allergic reaction.  Patient reports that she has been on IV ampicillin through a PICC line for the last 2 weeks since her discharge from the hospital.  She reports she is done well however 4 days ago she noticed she was having a yeast infection.  In order of Diflucan was sent in and she took it on Thursday.  She reports Friday morning she began having diffuse urticaria and pruritic reaction all over her body.  She does report that the redness was worse near the PICC line site.  She denies any difficulty breathing but reports she had some swelling of her left hand as well as some sore throat.  She denied the feeling of throat tightness or difficulty swallowing or breathing.  She has never had allergic reactions in the past.  She reports taking Benadryl at home for the last 2 days without success and her reaction continues to worsen.  On arrival, patient's  vital signs were hypertensive but otherwise reassuring.  Patient has a diffuse urticarial rash is seen in the photos.  Patient's rashes seen near the PICC line site moving proximally.  Patient's lungs were clear and chest was nontender.  Back was nontender.  Patient otherwise appeared well.  No oropharyngeal edema or swelling was seen.  No stridor appreciated on exam.  Lungs clear.  Patient was given Solu-Medrol, IV Benadryl, and IV Pepcid to help with the reaction.  She reports that the itching has begun to improve however she has rash still present.  The patient continues to get the ampicillin which is still going.  Infectious disease was called and they feel it was not due to the Diflucan but likely the ampicillin that she is still getting.  They recommended transitioning the patient to vancomycin for further treatment.  They recommend she get vancomycin troughs and repeat BMPs while on the medication.    Given the patient's ongoing reaction and the complexity of switching her outpatient management, both the infectious disease team and the care coordination team felt it was safest for the patient to be admitted to the hospital for antibiotic management as well as monitoring her antibiotic transition.  Patient agreed with plan for admission for switching her antibiotics and monitoring her for the reaction which is continuing.    Hospitalist and will be called for admission.    Final Clinical Impressions(s) / ED Diagnoses   Final diagnoses:  Allergic reaction, initial encounter    ED Discharge Orders    None      Clinical Impression: 1. Allergic reaction, initial encounter     Disposition: Admit  This note was prepared with assistance of Dragon voice recognition software. Occasional wrong-word or sound-a-like substitutions may have occurred due to the inherent limitations of voice recognition software.      Ilea Hilton, Gwenyth Allegra, MD 02/19/18 [phone]

## 2018-02-19 NOTE — Plan of Care (Signed)
52 y/o F with Hx of osteo on IV penicillin, presented with allergic reaction. ID consulted advised to switch to vancomycin and arrange home health, follow up. Patient accepted to for obs in medsurg.   Chipper Oman, MD

## 2018-02-19 NOTE — ED Notes (Signed)
Pt on cardiac monitor and auto VS 

## 2018-02-20 DIAGNOSIS — L270 Generalized skin eruption due to drugs and medicaments taken internally: Secondary | ICD-10-CM | POA: Diagnosis present

## 2018-02-20 DIAGNOSIS — Z7984 Long term (current) use of oral hypoglycemic drugs: Secondary | ICD-10-CM | POA: Diagnosis not present

## 2018-02-20 DIAGNOSIS — E1169 Type 2 diabetes mellitus with other specified complication: Secondary | ICD-10-CM | POA: Diagnosis not present

## 2018-02-20 DIAGNOSIS — Z79891 Long term (current) use of opiate analgesic: Secondary | ICD-10-CM | POA: Diagnosis not present

## 2018-02-20 DIAGNOSIS — Z452 Encounter for adjustment and management of vascular access device: Secondary | ICD-10-CM | POA: Diagnosis not present

## 2018-02-20 DIAGNOSIS — D649 Anemia, unspecified: Secondary | ICD-10-CM | POA: Diagnosis not present

## 2018-02-20 DIAGNOSIS — Y92009 Unspecified place in unspecified non-institutional (private) residence as the place of occurrence of the external cause: Secondary | ICD-10-CM | POA: Diagnosis not present

## 2018-02-20 DIAGNOSIS — L299 Pruritus, unspecified: Secondary | ICD-10-CM

## 2018-02-20 DIAGNOSIS — L500 Allergic urticaria: Secondary | ICD-10-CM | POA: Diagnosis present

## 2018-02-20 DIAGNOSIS — E119 Type 2 diabetes mellitus without complications: Secondary | ICD-10-CM | POA: Diagnosis not present

## 2018-02-20 DIAGNOSIS — D509 Iron deficiency anemia, unspecified: Secondary | ICD-10-CM | POA: Diagnosis present

## 2018-02-20 DIAGNOSIS — D696 Thrombocytopenia, unspecified: Secondary | ICD-10-CM | POA: Diagnosis not present

## 2018-02-20 DIAGNOSIS — Z7982 Long term (current) use of aspirin: Secondary | ICD-10-CM | POA: Diagnosis not present

## 2018-02-20 DIAGNOSIS — Z833 Family history of diabetes mellitus: Secondary | ICD-10-CM | POA: Diagnosis not present

## 2018-02-20 DIAGNOSIS — T7840XA Allergy, unspecified, initial encounter: Secondary | ICD-10-CM | POA: Diagnosis not present

## 2018-02-20 DIAGNOSIS — M5136 Other intervertebral disc degeneration, lumbar region: Secondary | ICD-10-CM | POA: Diagnosis not present

## 2018-02-20 DIAGNOSIS — Z5181 Encounter for therapeutic drug level monitoring: Secondary | ICD-10-CM | POA: Diagnosis not present

## 2018-02-20 DIAGNOSIS — E785 Hyperlipidemia, unspecified: Secondary | ICD-10-CM | POA: Diagnosis not present

## 2018-02-20 DIAGNOSIS — M4647 Discitis, unspecified, lumbosacral region: Secondary | ICD-10-CM | POA: Diagnosis present

## 2018-02-20 DIAGNOSIS — M4646 Discitis, unspecified, lumbar region: Secondary | ICD-10-CM | POA: Diagnosis not present

## 2018-02-20 DIAGNOSIS — R7881 Bacteremia: Secondary | ICD-10-CM | POA: Diagnosis present

## 2018-02-20 DIAGNOSIS — A409 Streptococcal sepsis, unspecified: Secondary | ICD-10-CM | POA: Diagnosis not present

## 2018-02-20 DIAGNOSIS — T360X5A Adverse effect of penicillins, initial encounter: Secondary | ICD-10-CM | POA: Diagnosis present

## 2018-02-20 DIAGNOSIS — I10 Essential (primary) hypertension: Secondary | ICD-10-CM | POA: Diagnosis not present

## 2018-02-20 DIAGNOSIS — Z792 Long term (current) use of antibiotics: Secondary | ICD-10-CM | POA: Diagnosis not present

## 2018-02-20 DIAGNOSIS — Z8249 Family history of ischemic heart disease and other diseases of the circulatory system: Secondary | ICD-10-CM | POA: Diagnosis not present

## 2018-02-20 DIAGNOSIS — G8929 Other chronic pain: Secondary | ICD-10-CM | POA: Diagnosis not present

## 2018-02-20 DIAGNOSIS — Z791 Long term (current) use of non-steroidal anti-inflammatories (NSAID): Secondary | ICD-10-CM | POA: Diagnosis not present

## 2018-02-20 DIAGNOSIS — M4626 Osteomyelitis of vertebra, lumbar region: Secondary | ICD-10-CM | POA: Diagnosis not present

## 2018-02-20 LAB — COMPREHENSIVE METABOLIC PANEL
ALBUMIN: 2.9 g/dL — AB (ref 3.5–5.0)
ALK PHOS: 55 U/L (ref 38–126)
ALT: 11 U/L (ref 0–44)
AST: 13 U/L — AB (ref 15–41)
Anion gap: 8 (ref 5–15)
BILIRUBIN TOTAL: 0.3 mg/dL (ref 0.3–1.2)
BUN: 19 mg/dL (ref 6–20)
CALCIUM: 9.1 mg/dL (ref 8.9–10.3)
CO2: 28 mmol/L (ref 22–32)
CREATININE: 1.03 mg/dL — AB (ref 0.44–1.00)
Chloride: 104 mmol/L (ref 98–111)
GFR calc Af Amer: >60 mL/min (ref >60)
GLUCOSE: 186 mg/dL — AB (ref 70–99)
POTASSIUM: 4.2 mmol/L (ref 3.5–5.1)
Sodium: 140 mmol/L (ref 135–145)
TOTAL PROTEIN: 8.3 g/dL — AB (ref 6.5–8.1)

## 2018-02-20 LAB — CBC
HCT: 27 % — ABNORMAL LOW (ref 36.0–46.0)
Hemoglobin: 9.1 g/dL — ABNORMAL LOW (ref 12.0–15.0)
MCH: 26.8 pg (ref 26.0–34.0)
MCHC: 33.7 g/dL (ref 30.0–36.0)
MCV: 79.6 fL (ref 78.0–100.0)
PLATELETS: 243 10*3/uL (ref 150–400)
RBC: 3.39 MIL/uL — ABNORMAL LOW (ref 3.87–5.11)
RDW: 15.8 % — AB (ref 11.5–15.5)
WBC: 8.6 10*3/uL (ref 4.0–10.5)

## 2018-02-20 MED ORDER — METHYLPREDNISOLONE SODIUM SUCC 40 MG IJ SOLR
40.0000 mg | Freq: Four times a day (QID) | INTRAMUSCULAR | Status: DC
  Administered 2018-02-20 – 2018-02-21 (×6): 40 mg via INTRAVENOUS
  Filled 2018-02-20 (×6): qty 1

## 2018-02-20 MED ORDER — FERROUS SULFATE 325 (65 FE) MG PO TABS
325.0000 mg | ORAL_TABLET | Freq: Every day | ORAL | Status: DC
  Administered 2018-02-20 – 2018-02-22 (×3): 325 mg via ORAL
  Filled 2018-02-20 (×3): qty 1

## 2018-02-20 MED ORDER — DIPHENHYDRAMINE HCL 50 MG/ML IJ SOLN
25.0000 mg | Freq: Three times a day (TID) | INTRAMUSCULAR | Status: DC
  Administered 2018-02-20 – 2018-02-24 (×13): 25 mg via INTRAVENOUS
  Filled 2018-02-20 (×14): qty 1

## 2018-02-20 MED ORDER — SODIUM CHLORIDE 0.9 % IV SOLN
INTRAVENOUS | Status: DC
  Administered 2018-02-20 – 2018-02-25 (×2): via INTRAVENOUS

## 2018-02-20 MED ORDER — FAMOTIDINE IN NACL 20-0.9 MG/50ML-% IV SOLN
20.0000 mg | Freq: Two times a day (BID) | INTRAVENOUS | Status: DC
  Administered 2018-02-20 – 2018-02-24 (×9): 20 mg via INTRAVENOUS
  Filled 2018-02-20 (×9): qty 50

## 2018-02-20 NOTE — Progress Notes (Signed)
Inpatient Diabetes Program Recommendations  AACE/ADA: New Consensus Statement on Inpatient Glycemic Control (2015)  Target Ranges:  Prepandial:   less than 140 mg/dL      Peak postprandial:   less than 180 mg/dL (1-2 hours)      Critically ill patients:  140 - 180 mg/dL    Results for CAREY, LAFON (MRN [id number]) as of 02/20/2018 07:53  Ref. Range 02/20/2018 04:09  Glucose Latest Ref Range: 70 - 99 mg/dL 186 (H)    Admit with: Allergic Reaction to Penicillin  History: DM  Home DM Meds: Metformin 500 mg daily  Current Insulin Orders: Metformin 500 mg QPM     MD- Please consider starting Novolog Sensitive Correction Scale/ SSI (0-9 units) TID AC + HS      --Will follow patient during hospitalization--  Wyn Quaker RN, MSN, CDE Diabetes Coordinator Inpatient Glycemic Control Team Team Pager: [phone] (8a-5p)

## 2018-02-20 NOTE — Progress Notes (Addendum)
PROGRESS NOTE  [name] HYQ:[id number] DOB: 08-18-1966 DOA: 02/19/2018 PCP: Benito Mccreedy, MD  HPI/Recap of past 24 hours: [name] is a 52 y.o. female with medical history significant of hypertension, diabetes type 2, recently discharged due to strep viridans's discitis/osteomyelitis.  Patient presented to the emergency department complaining of itching, rash and swelling.  Patient reported the symptoms started 2 days ago where she noted some redness around the PICC line, subsequently had high in her abdomen and left hand swelling.  Patient denies difficulty breathing no difficulty swallowing or wheezing.  Patient has been treated with ampicillin IV through PICC line for the past 2 weeks and was prescribed Diflucan 1 day prior to symptoms.  02/20/18: Persistent pruritus and wheals. Seen and examined w her husband at her baseline.    Assessment/Plan: Active Problems:   Allergic reaction caused by a drug  Allergic rx caused by a drug Suspect 2/2 ampicillin Start IV solumedrol, IV benadryl, IV pepcid C/w to monitor  Hx of strep viridans discitis/osteomyelitis On IV vancomycin C/w monitor vanc through and renal function  Microcytic anemia/iron deficiency anemia Hg 9.1 mcv 79.6 Start ferrous sulfate Repeat cbc in the am   Type 2 DM Obtain A1C ISS  HTN BP stable C/w home meds    Code Status: full  Family Communication: husband at bedside  Disposition Plan: home when clinically stable   Consultants:  none  Procedures:  none  Antimicrobials:  IV vancomycin  DVT prophylaxis:  sq lovenox daily   Objective: Vitals:   02/19/18 1830 02/19/18 2020 02/20/18 0525 02/20/18 1410  BP: (!) 153/90 (!) 157/88 (!) 132/57 (!) 144/84  Pulse: 87 67 66 67  Resp: (!) 22 16 15 18   Temp: 98.4 F (36.9 C) 97.9 F (36.6 C) 98.8 F (37.1 C) 98.8 F (37.1 C)  TempSrc: Oral Oral  Oral  SpO2: 98% 99% 99% 98%  Weight:      Height:        Intake/Output  Summary (Last 24 hours) at 02/20/2018 1603 Last data filed at 02/20/2018 1400 Gross per 24 hour  Intake 900 ml  Output 650 ml  Net 250 ml   Filed Weights   02/19/18 0659  Weight: 77.1 kg (170 lb)    Exam:  . General: 52 y.o. year-old female well developed well nourished uncomfortable w pruritus.  Alert and oriented x3. . Cardiovascular: Regular rate and rhythm with no rubs or gallops.  No thyromegaly or JVD noted.   Marland Kitchen Respiratory: Clear to auscultation with no wheezes or rales. Good inspiratory effort. . Abdomen: Soft nontender nondistended with normal bowel sounds x4 quadrants. . Musculoskeletal: No lower extremity edema. 2/4 pulses in all 4 extremities. . Skin: rash and pruritus on thighs and arms . Psychiatry: Mood is appropriate for condition and setting   Data Reviewed: CBC: Recent Labs  Lab 02/20/18 0409  WBC 8.6  HGB 9.1*  HCT 27.0*  MCV 79.6  PLT 952   Basic Metabolic Panel: Recent Labs  Lab 02/20/18 0409  NA 140  K 4.2  CL 104  CO2 28  GLUCOSE 186*  BUN 19  CREATININE 1.03*  CALCIUM 9.1   GFR: Estimated Creatinine Clearance: 65 mL/min (A) (by C-G formula based on SCr of 1.03 mg/dL (H)). Liver Function Tests: Recent Labs  Lab 02/20/18 0409  AST 13*  ALT 11  ALKPHOS 55  BILITOT 0.3  PROT 8.3*  ALBUMIN 2.9*   No results for input(s): LIPASE, AMYLASE in the last 168 hours.  No results for input(s): AMMONIA in the last 168 hours. Coagulation Profile: No results for input(s): INR, PROTIME in the last 168 hours. Cardiac Enzymes: No results for input(s): CKTOTAL, CKMB, CKMBINDEX, TROPONINI in the last 168 hours. BNP (last 3 results) No results for input(s): PROBNP in the last 8760 hours. HbA1C: No results for input(s): HGBA1C in the last 72 hours. CBG: No results for input(s): GLUCAP in the last 168 hours. Lipid Profile: No results for input(s): CHOL, HDL, LDLCALC, TRIG, CHOLHDL, LDLDIRECT in the last 72 hours. Thyroid Function Tests: No results  for input(s): TSH, T4TOTAL, FREET4, T3FREE, THYROIDAB in the last 72 hours. Anemia Panel: No results for input(s): VITAMINB12, FOLATE, FERRITIN, TIBC, IRON, RETICCTPCT in the last 72 hours. Urine analysis:    Component Value Date/Time   COLORURINE YELLOW 08/01/2013 2111   APPEARANCEUR CLEAR 08/01/2013 2111   LABSPEC 1.008 08/01/2013 2111   PHURINE 6.0 08/01/2013 2111   GLUCOSEU NEGATIVE 08/01/2013 2111   HGBUR SMALL (A) 08/01/2013 2111   BILIRUBINUR NEGATIVE 08/01/2013 2111   [place] 08/01/2013 2111   PROTEINUR NEGATIVE 08/01/2013 2111   UROBILINOGEN 0.2 08/01/2013 2111   NITRITE NEGATIVE 08/01/2013 2111   LEUKOCYTESUR SMALL (A) 08/01/2013 2111   Sepsis Labs: @LABRCNTIP (procalcitonin:4,lacticidven:4)  )No results found for this or any previous visit (from the past 240 hour(s)).    Studies: No results found.  Scheduled Meds: . aspirin  81 mg Oral Daily  . diphenhydrAMINE  25 mg Intravenous TID  . enoxaparin (LOVENOX) injection  40 mg Subcutaneous Q24H  . ferrous sulfate  325 mg Oral Q breakfast  . lisinopril  10 mg Oral Daily  . meloxicam  15 mg Oral Daily  . metFORMIN  500 mg Oral Q supper  . methocarbamol  500 mg Oral BID  . methylPREDNISolone (SOLU-MEDROL) injection  40 mg Intravenous Q6H  . metoprolol succinate  12.5 mg Oral QHS  . oxyCODONE-acetaminophen  1 tablet Oral TID   And  . oxyCODONE  5 mg Oral TID  . pregabalin  50 mg Oral TID  . rosuvastatin  20 mg Oral q1800    Continuous Infusions: . famotidine (PEPCID) IV Stopped (02/20/18 1011)  . vancomycin       LOS: 0 days     Kayleen Memos, MD [facility] Pager [phone]  If 7PM-7AM, please contact night-coverage [email or web address] Password [email or web address] 02/20/2018, 4:03 PM

## 2018-02-21 DIAGNOSIS — E119 Type 2 diabetes mellitus without complications: Secondary | ICD-10-CM | POA: Diagnosis not present

## 2018-02-21 DIAGNOSIS — M4646 Discitis, unspecified, lumbar region: Secondary | ICD-10-CM | POA: Diagnosis not present

## 2018-02-21 LAB — HEMOGLOBIN A1C
HEMOGLOBIN A1C: 7.6 % — AB (ref 4.8–5.6)
MEAN PLASMA GLUCOSE: 171.42 mg/dL

## 2018-02-21 LAB — GLUCOSE, CAPILLARY
GLUCOSE-CAPILLARY: 297 mg/dL — AB (ref 70–99)
GLUCOSE-CAPILLARY: 303 mg/dL — AB (ref 70–99)
Glucose-Capillary: 169 mg/dL — ABNORMAL HIGH (ref 70–99)
Glucose-Capillary: 182 mg/dL — ABNORMAL HIGH (ref 70–99)

## 2018-02-21 LAB — CREATININE, SERUM
Creatinine, Ser: 0.86 mg/dL (ref 0.44–1.00)
GFR calc Af Amer: >60 mL/min (ref >60)
GFR calc non Af Amer: >60 mL/min (ref >60)

## 2018-02-21 MED ORDER — INSULIN ASPART 100 UNIT/ML ~~LOC~~ SOLN
0.0000 [IU] | Freq: Three times a day (TID) | SUBCUTANEOUS | Status: DC
  Administered 2018-02-22: 3 [IU] via SUBCUTANEOUS
  Administered 2018-02-22: 2 [IU] via SUBCUTANEOUS
  Administered 2018-02-22: 11 [IU] via SUBCUTANEOUS
  Administered 2018-02-23 (×2): 5 [IU] via SUBCUTANEOUS
  Administered 2018-02-23: 8 [IU] via SUBCUTANEOUS
  Administered 2018-02-24: 3 [IU] via SUBCUTANEOUS
  Administered 2018-02-24: 11 [IU] via SUBCUTANEOUS
  Administered 2018-02-24 – 2018-02-25 (×2): 5 [IU] via SUBCUTANEOUS
  Administered 2018-02-25: 15 [IU] via SUBCUTANEOUS

## 2018-02-21 MED ORDER — AMLODIPINE BESYLATE 10 MG PO TABS
10.0000 mg | ORAL_TABLET | Freq: Every day | ORAL | Status: DC
  Administered 2018-02-22 – 2018-02-25 (×4): 10 mg via ORAL
  Filled 2018-02-21 (×4): qty 1

## 2018-02-21 MED ORDER — HYDRALAZINE HCL 20 MG/ML IJ SOLN: 10.0000 mg | Freq: Four times a day (QID) | INTRAMUSCULAR | Status: DC | PRN

## 2018-02-21 MED ORDER — AMLODIPINE BESYLATE 5 MG PO TABS
5.0000 mg | ORAL_TABLET | Freq: Every day | ORAL | Status: DC
  Administered 2018-02-21: 5 mg via ORAL
  Filled 2018-02-21: qty 1

## 2018-02-21 MED ORDER — INSULIN ASPART 100 UNIT/ML ~~LOC~~ SOLN
0.0000 [IU] | Freq: Every day | SUBCUTANEOUS | Status: DC
  Administered 2018-02-22: 3 [IU] via SUBCUTANEOUS
  Administered 2018-02-23: 4 [IU] via SUBCUTANEOUS
  Administered 2018-02-24: 5 [IU] via SUBCUTANEOUS

## 2018-02-21 MED ORDER — AMLODIPINE BESYLATE 5 MG PO TABS
5.0000 mg | ORAL_TABLET | Freq: Once | ORAL | Status: AC
  Administered 2018-02-21: 5 mg via ORAL
  Filled 2018-02-21: qty 1

## 2018-02-21 MED ORDER — INSULIN ASPART 100 UNIT/ML ~~LOC~~ SOLN
0.0000 [IU] | Freq: Three times a day (TID) | SUBCUTANEOUS | Status: DC
  Administered 2018-02-21: 7 [IU] via SUBCUTANEOUS
  Administered 2018-02-21: 2 [IU] via SUBCUTANEOUS
  Administered 2018-02-21: 5 [IU] via SUBCUTANEOUS

## 2018-02-21 MED ORDER — METHYLPREDNISOLONE SODIUM SUCC 40 MG IJ SOLR
40.0000 mg | Freq: Two times a day (BID) | INTRAMUSCULAR | Status: DC
  Administered 2018-02-22: 40 mg via INTRAVENOUS
  Filled 2018-02-21: qty 1

## 2018-02-21 NOTE — Progress Notes (Signed)
Text page to Dr. Nevada Crane concerning pt's BP of 177/99. No prn med currently available.

## 2018-02-21 NOTE — Progress Notes (Signed)
PROGRESS NOTE  [name] YSA:[id number] DOB: 05-Apr-1966 DOA: 02/19/2018 PCP: Benito Mccreedy, MD  HPI/Recap of past 24 hours: [name] is a 52 y.o. female with medical history significant of hypertension, diabetes type 2, recently discharged due to strep viridans's discitis/osteomyelitis.  Patient presented to the emergency department complaining of itching, rash and swelling.  Patient reported the symptoms started 2 days ago where she noted some redness around the PICC line, subsequently had hives in her abdomen, b/l upper and lower extremities. Was on IV ampicillin for 4 weeks prior to onset on symptoms and on diflucan for just 1 day.  Hospital course complicated by persistent pruritus and worsening wheals. Doubt her allergic reaction is from ampicillin. Suspect lisinopril which was stopped this am 02/21/18.  Seen and examined at her bedside. Complaints of itching all over. On IV solumedrol, IV benadryl and IV antihistamine. Stopped lisinopril.     Assessment/Plan: Active Problems:   Allergic reaction caused by a drug  Allergic rx caused by a drug Suspect 2/2 to lisinopril Airway is clear and no wheezing on lung exam Stop lisinopril and c/w close monitoring Restart ampicillin tomorrow if symptoms improve C/w IV solumedrol, IV benadryl, IV pepcid  Hx of strep viridans discitis/osteomyelitis On IV vancomycin currently Consider restarting IV ampicillin if ruled out as a cause of her allergic rxn. C/w monitor vanc through and renal function  Microcytic anemia/iron deficiency anemia Hg 9.1 mcv 79.6 C/w ferrous sulfate Obtain CBC in the am  Type 2 DM Hg A1C 7.6 on 02/21/18 Start ISS sensitive scale  HTN BP stable Stop lisinopril C/w metoprol Add amlodipine    Code Status: full  Family Communication: husband at bedside  Disposition Plan: home when clinically stable   Consultants:  none  Procedures:  none  Antimicrobials:  IV  vancomycin  DVT prophylaxis:  sq lovenox daily   Objective: Vitals:   02/20/18 0525 02/20/18 1410 02/20/18 1954 02/21/18 0657  BP: (!) 132/57 (!) 144/84 140/74 139/72  Pulse: 66 67 (!) 58 62  Resp: 15 18 16 17   Temp: 98.8 F (37.1 C) 98.8 F (37.1 C) 98.4 F (36.9 C) 98.9 F (37.2 C)  TempSrc:  Oral Oral   SpO2: 99% 98% 98% 98%  Weight:      Height:        Intake/Output Summary (Last 24 hours) at 02/21/2018 1117 Last data filed at 02/21/2018 1000 Gross per 24 hour  Intake 1182.39 ml  Output 800 ml  Net 382.39 ml   Filed Weights   02/19/18 0659  Weight: 77.1 kg (170 lb)    Exam:  . General: 52 y.o. year-old female WD WN NAD A&O x 3. Airway is patent w no swelling or erythema. . Cardiovascular: RRR no rubs or gallops. NO JVD or thyromegaly.  Marland Kitchen Respiratory: Clear to auscultation with no wheezes or rales. Good inspiratory effort. . Abdomen: Soft nontender nondistended with normal bowel sounds x4 quadrants. . Musculoskeletal: No lower extremity edema. 2/4 pulses in all 4 extremities. . Skin: worsening rash and pruritus on thighs and arms. Now rash is affecting upper eyelids. . Psychiatry: Mood is appropriate for condition and setting   Data Reviewed: CBC: Recent Labs  Lab 02/20/18 0409  WBC 8.6  HGB 9.1*  HCT 27.0*  MCV 79.6  PLT 323   Basic Metabolic Panel: Recent Labs  Lab 02/20/18 0409 02/21/18 0413  NA 140  --   K 4.2  --   CL 104  --   CO2 28  --  GLUCOSE 186*  --   BUN 19  --   CREATININE 1.03* 0.86  CALCIUM 9.1  --    GFR: Estimated Creatinine Clearance: 77.8 mL/min (by C-G formula based on SCr of 0.86 mg/dL). Liver Function Tests: Recent Labs  Lab 02/20/18 0409  AST 13*  ALT 11  ALKPHOS 55  BILITOT 0.3  PROT 8.3*  ALBUMIN 2.9*   No results for input(s): LIPASE, AMYLASE in the last 168 hours. No results for input(s): AMMONIA in the last 168 hours. Coagulation Profile: No results for input(s): INR, PROTIME in the last 168  hours. Cardiac Enzymes: No results for input(s): CKTOTAL, CKMB, CKMBINDEX, TROPONINI in the last 168 hours. BNP (last 3 results) No results for input(s): PROBNP in the last 8760 hours. HbA1C: Recent Labs    02/21/18 0854  HGBA1C 7.6*   CBG: Recent Labs  Lab 02/21/18 0816  GLUCAP 169*   Lipid Profile: No results for input(s): CHOL, HDL, LDLCALC, TRIG, CHOLHDL, LDLDIRECT in the last 72 hours. Thyroid Function Tests: No results for input(s): TSH, T4TOTAL, FREET4, T3FREE, THYROIDAB in the last 72 hours. Anemia Panel: No results for input(s): VITAMINB12, FOLATE, FERRITIN, TIBC, IRON, RETICCTPCT in the last 72 hours. Urine analysis:    Component Value Date/Time   COLORURINE YELLOW 08/01/2013 2111   APPEARANCEUR CLEAR 08/01/2013 2111   LABSPEC 1.008 08/01/2013 2111   PHURINE 6.0 08/01/2013 2111   GLUCOSEU NEGATIVE 08/01/2013 2111   HGBUR SMALL (A) 08/01/2013 2111   BILIRUBINUR NEGATIVE 08/01/2013 2111   [place] 08/01/2013 2111   PROTEINUR NEGATIVE 08/01/2013 2111   UROBILINOGEN 0.2 08/01/2013 2111   NITRITE NEGATIVE 08/01/2013 2111   LEUKOCYTESUR SMALL (A) 08/01/2013 2111   Sepsis Labs: @LABRCNTIP (procalcitonin:4,lacticidven:4)  )No results found for this or any previous visit (from the past 240 hour(s)).    Studies: No results found.  Scheduled Meds: . amLODipine  5 mg Oral Daily  . aspirin  81 mg Oral Daily  . diphenhydrAMINE  25 mg Intravenous TID  . enoxaparin (LOVENOX) injection  40 mg Subcutaneous Q24H  . ferrous sulfate  325 mg Oral Q breakfast  . insulin aspart  0-9 Units Subcutaneous TID WC  . meloxicam  15 mg Oral Daily  . metFORMIN  500 mg Oral Q supper  . methocarbamol  500 mg Oral BID  . methylPREDNISolone (SOLU-MEDROL) injection  40 mg Intravenous Q6H  . metoprolol succinate  12.5 mg Oral QHS  . oxyCODONE-acetaminophen  1 tablet Oral TID   And  . oxyCODONE  5 mg Oral TID  . pregabalin  50 mg Oral TID  . rosuvastatin  20 mg Oral q1800     Continuous Infusions: . sodium chloride 10 mL/hr at 02/20/18 0940  . famotidine (PEPCID) IV 20 mg (02/21/18 1004)  . vancomycin Stopped (02/20/18 1835)     LOS: 1 day     Kayleen Memos, MD [facility] Pager [phone]  If 7PM-7AM, please contact night-coverage [email or web address] Password [email or web address] 02/21/2018, 11:17 AM

## 2018-02-21 NOTE — Progress Notes (Signed)
Patient complaints of continued itching on swollen areas. Patient ambulating to bathroom, vitals stable, pain controlled.

## 2018-02-22 DIAGNOSIS — M4646 Discitis, unspecified, lumbar region: Secondary | ICD-10-CM | POA: Diagnosis not present

## 2018-02-22 DIAGNOSIS — E119 Type 2 diabetes mellitus without complications: Secondary | ICD-10-CM | POA: Diagnosis not present

## 2018-02-22 LAB — CBC
HEMATOCRIT: 28.4 % — AB (ref 36.0–46.0)
Hemoglobin: 9.4 g/dL — ABNORMAL LOW (ref 12.0–15.0)
MCH: 26.5 pg (ref 26.0–34.0)
MCHC: 33.1 g/dL (ref 30.0–36.0)
MCV: 80 fL (ref 78.0–100.0)
Platelets: 208 10*3/uL (ref 150–400)
RBC: 3.55 MIL/uL — AB (ref 3.87–5.11)
RDW: 15.7 % — AB (ref 11.5–15.5)
WBC: 13.7 10*3/uL — AB (ref 4.0–10.5)

## 2018-02-22 LAB — BASIC METABOLIC PANEL
ANION GAP: 7 (ref 5–15)
BUN: 15 mg/dL (ref 6–20)
CHLORIDE: 103 mmol/L (ref 98–111)
CO2: 28 mmol/L (ref 22–32)
Calcium: 8.7 mg/dL — ABNORMAL LOW (ref 8.9–10.3)
Creatinine, Ser: 0.73 mg/dL (ref 0.44–1.00)
GFR calc Af Amer: >60 mL/min (ref >60)
GFR calc non Af Amer: >60 mL/min (ref >60)
GLUCOSE: 165 mg/dL — AB (ref 70–99)
POTASSIUM: 3.9 mmol/L (ref 3.5–5.1)
Sodium: 138 mmol/L (ref 135–145)

## 2018-02-22 LAB — GLUCOSE, CAPILLARY
Glucose-Capillary: 137 mg/dL — ABNORMAL HIGH (ref 70–99)
Glucose-Capillary: 330 mg/dL — ABNORMAL HIGH (ref 70–99)
Glucose-Capillary: 199 mg/dL — ABNORMAL HIGH (ref 70–99)
Glucose-Capillary: 297 mg/dL — ABNORMAL HIGH (ref 70–99)

## 2018-02-22 MED ORDER — METHYLPREDNISOLONE SODIUM SUCC 125 MG IJ SOLR
60.0000 mg | Freq: Three times a day (TID) | INTRAMUSCULAR | Status: DC
  Administered 2018-02-22 – 2018-02-25 (×10): 60 mg via INTRAVENOUS
  Filled 2018-02-22 (×10): qty 2

## 2018-02-22 MED ORDER — DIPHENHYDRAMINE-ZINC ACETATE 2-0.1 % EX CREA
TOPICAL_CREAM | Freq: Three times a day (TID) | CUTANEOUS | Status: DC
  Administered 2018-02-22 – 2018-02-24 (×7): via TOPICAL
  Filled 2018-02-22 (×2): qty 28

## 2018-02-22 NOTE — Progress Notes (Signed)
PHARMACY CONSULT NOTE FOR:  OUTPATIENT  PARENTERAL ANTIBIOTIC THERAPY (OPAT)  Indication: OM/discitis Regimen: Vancomycin 1250 mg IV q24h End date: 03/24/18  IV antibiotic discharge orders are pended. To discharging provider:  please sign these orders via discharge navigator,  Select New Orders & click on the button choice - Manage This Unsigned Work.     Thank you for allowing pharmacy to be a part of this patient's care.  Lenis Noon, PharmD 02/22/2018, 1:46 PM

## 2018-02-22 NOTE — Progress Notes (Signed)
Inpatient Diabetes Program Recommendations  AACE/ADA: New Consensus Statement on Inpatient Glycemic Control (2015)  Target Ranges:  Prepandial:   less than 140 mg/dL      Peak postprandial:   less than 180 mg/dL (1-2 hours)      Critically ill patients:  140 - 180 mg/dL   Results for JANYIA, GUION (MRN [id number]) as of 02/22/2018 15:12  Ref. Range 02/21/2018 08:16 02/21/2018 13:19 02/21/2018 17:09 02/21/2018 22:10  Glucose-Capillary Latest Ref Range: 70 - 99 mg/dL 169 (H)  2 units NOVOLOG  297 (H)  5 units NOVOLOG  303 (H)  7 units NOVOLOG  182 (H)   Results for JAZEL, NIMMONS (MRN [id number]) as of 02/22/2018 15:12  Ref. Range 02/22/2018 08:51 02/22/2018 12:54  Glucose-Capillary Latest Ref Range: 70 - 99 mg/dL 137 (H)  2 units NOVOLOG  330 (H)  11 units NOVOLOG       Admit with: Allergic Reaction to Penicillin  History: DM  Home DM Meds: Metformin 500 mg daily  Current Insulin Orders: Novolog Moderate Correction Scale/ SSI (0-15 units) TID AC + HS     MD- Note Solumedrol increased to 60 mg Q8 hours today.  Patient having significant glucose elevations after meals.  Please consider starting Novolog Meal Coverage while patient getting IV steroids:  Novolog 6 units TID with meals (Please add the following Hold Parameters: Hold if pt eats <50% of meal, Hold if pt NPO)     --Will follow patient during hospitalization--  Wyn Quaker RN, MSN, CDE Diabetes Coordinator Inpatient Glycemic Control Team Team Pager: [phone] (8a-5p)

## 2018-02-22 NOTE — Progress Notes (Addendum)
PROGRESS NOTE    [name]  PPI:[id number] DOB: 19-Jun-1966 DOA: 02/19/2018 PCP: Benito Mccreedy, MD   Brief Narrative: Patient is a 52 year old female with past medical history of hypertension, diabetes type 2, recently discharged due to strep viridans discitis osteomyelitis on ampicillin who presents to the emergency department with complaints of itching, rash and swelling along with wheals.  Patient was admitted for the management of possible drug-induced rash.  Ampicillin has been discontinued and she has been switched to vancomycin now.  Her hospital course is remarkable for persistent pruritus, worsening with this.  She was also recently started on lisinopril which has been stopped.  Assessment & Plan:   Active Problems:   Allergic reaction caused by a drug  Allergic reaction possibly caused by drug: Currently we do not know the exact etiology of the rash.  I have further discontinued meloxicam, pregabalin and Robaxin which have been recently started about 2 months ago.  Lisinopril and ampicillin have been stopped.  Continue Solu-Medrol, Benadryl and Pepcid.  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  She was just discharged on ampicillin via PICC line.  The last day of antibiotic is 03/24/18.  ID was consulted on admission this time and her antibiotic was recommended to be changed to vancomycin.  I have discussed with Dr. Johnnye Sima today who recommends to continue vancomycin.  The allergy could still be from ampicillin and might take time to resolve.  History of iron deficiency anemia: Hemoglobin of 9.1.  On iron supplement at home.  I have discontinued iron also for now.    Diabetes type 2: Hemoglobin A1c of 7.6 on 7/2 2/19.  Continue sliding-scale insulin  Hypertension: Currently blood pressure stable.  On metoprolol and amlodipine.  Lisinopril stopped.`   DVT prophylaxis: Lovenox Code Status: Full Family Communication: Family present  at the bedside Disposition Plan: Home after resolution of allergy   Consultants: None  Procedures: None  Antimicrobials: Vancomycin   Subjective: Patient seen and examined the bedside this morning.  She complains of worsening itchy rash/wheals on her bilateral thighs, legs and upper extremities.  No rash on the face.  No lip/tongue swelling.  Objective: Vitals:   02/21/18 0657 02/21/18 1444 02/21/18 2032 02/22/18 0555  BP: 139/72 (!) 177/99 (!) 174/83 138/78  Pulse: 62 72 64 (!) 53  Resp: 17 15 18 18   Temp: 98.9 F (37.2 C) 97.9 F (36.6 C) 97.7 F (36.5 C) 98.4 F (36.9 C)  TempSrc:  Oral Oral Oral  SpO2: 98% 100% 100% 100%  Weight:      Height:        Intake/Output Summary (Last 24 hours) at 02/22/2018 1012 Last data filed at 02/22/2018 0559 Gross per 24 hour  Intake 1974.17 ml  Output 2400 ml  Net -425.83 ml   Filed Weights   02/19/18 0659  Weight: 77.1 kg (170 lb)    Examination:  General exam: Appears calm and comfortable ,Not in distress,average built HEENT:PERRL,Oral mucosa moist, Ear/Nose normal on gross exam Respiratory system: Bilateral equal air entry, normal vesicular breath sounds, no wheezes or crackles  Cardiovascular system: S1 & S2 heard, RRR. No JVD, murmurs, rubs, gallops or clicks. No pedal edema. Gastrointestinal system: Abdomen is nondistended, soft and nontender. No organomegaly or masses felt. Normal bowel sounds heard. Central nervous system: Alert and oriented. No focal neurological deficits. Extremities: No edema, no clubbing ,no cyanosis, distal peripheral pulses palpable.  Wheals on bilateral upper thighs, legs and upper extremities Skin: No ulcers,no icterus ,  no pallor MSK: Normal muscle bulk,tone ,power Psychiatry: Judgement and insight appear normal. Mood & affect appropriate.     Data Reviewed: I have personally reviewed following labs and imaging studies  CBC: Recent Labs  Lab 02/20/18 0409 02/22/18 0412  WBC 8.6 13.7*    HGB 9.1* 9.4*  HCT 27.0* 28.4*  MCV 79.6 80.0  PLT 243 341   Basic Metabolic Panel: Recent Labs  Lab 02/20/18 0409 02/21/18 0413 02/22/18 0412  NA 140  --  138  K 4.2  --  3.9  CL 104  --  103  CO2 28  --  28  GLUCOSE 186*  --  165*  BUN 19  --  15  CREATININE 1.03* 0.86 0.73  CALCIUM 9.1  --  8.7*   GFR: Estimated Creatinine Clearance: 83.7 mL/min (by C-G formula based on SCr of 0.73 mg/dL). Liver Function Tests: Recent Labs  Lab 02/20/18 0409  AST 13*  ALT 11  ALKPHOS 55  BILITOT 0.3  PROT 8.3*  ALBUMIN 2.9*   No results for input(s): LIPASE, AMYLASE in the last 168 hours. No results for input(s): AMMONIA in the last 168 hours. Coagulation Profile: No results for input(s): INR, PROTIME in the last 168 hours. Cardiac Enzymes: No results for input(s): CKTOTAL, CKMB, CKMBINDEX, TROPONINI in the last 168 hours. BNP (last 3 results) No results for input(s): PROBNP in the last 8760 hours. HbA1C: Recent Labs    02/21/18 0854  HGBA1C 7.6*   CBG: Recent Labs  Lab 02/21/18 0816 02/21/18 1319 02/21/18 1709 02/21/18 2210 02/22/18 0851  GLUCAP 169* 297* 303* 182* 137*   Lipid Profile: No results for input(s): CHOL, HDL, LDLCALC, TRIG, CHOLHDL, LDLDIRECT in the last 72 hours. Thyroid Function Tests: No results for input(s): TSH, T4TOTAL, FREET4, T3FREE, THYROIDAB in the last 72 hours. Anemia Panel: No results for input(s): VITAMINB12, FOLATE, FERRITIN, TIBC, IRON, RETICCTPCT in the last 72 hours. Sepsis Labs: No results for input(s): PROCALCITON, LATICACIDVEN in the last 168 hours.  No results found for this or any previous visit (from the past 240 hour(s)).       Radiology Studies: No results found.      Scheduled Meds: . amLODipine  10 mg Oral Daily  . aspirin  81 mg Oral Daily  . diphenhydrAMINE  25 mg Intravenous TID  . diphenhydrAMINE-zinc acetate   Topical TID  . enoxaparin (LOVENOX) injection  40 mg Subcutaneous Q24H  . insulin aspart   0-15 Units Subcutaneous TID WC  . insulin aspart  0-5 Units Subcutaneous QHS  . methylPREDNISolone (SOLU-MEDROL) injection  60 mg Intravenous Q8H  . metoprolol succinate  12.5 mg Oral QHS  . oxyCODONE-acetaminophen  1 tablet Oral TID   And  . oxyCODONE  5 mg Oral TID  . rosuvastatin  20 mg Oral q1800   Continuous Infusions: . sodium chloride 10 mL/hr at 02/20/18 0940  . famotidine (PEPCID) IV 20 mg (02/22/18 0930)  . vancomycin Stopped (02/21/18 2000)     LOS: 2 days    Time spent: 35 mins.More than 50% of that time was spent in counseling and/or coordination of care.      Shelly Coss, MD [facility] Pager [phone]  If 7PM-7AM, please contact night-coverage [email or web address] Password [email or web address] 02/22/2018, 10:12 AM

## 2018-02-23 LAB — CBC WITH DIFFERENTIAL/PLATELET
Basophils Absolute: 0 10*3/uL (ref 0.0–0.1)
Basophils Relative: 0 %
EOS ABS: 0 10*3/uL (ref 0.0–0.7)
EOS PCT: 0 %
HCT: 30.4 % — ABNORMAL LOW (ref 36.0–46.0)
Hemoglobin: 10.1 g/dL — ABNORMAL LOW (ref 12.0–15.0)
LYMPHS ABS: 1.1 10*3/uL (ref 0.7–4.0)
LYMPHS PCT: 9 %
MCH: 26.1 pg (ref 26.0–34.0)
MCHC: 33.2 g/dL (ref 30.0–36.0)
MCV: 78.6 fL (ref 78.0–100.0)
MONO ABS: 0.3 10*3/uL (ref 0.1–1.0)
Monocytes Relative: 3 %
Neutro Abs: 10.6 10*3/uL — ABNORMAL HIGH (ref 1.7–7.7)
Neutrophils Relative %: 88 %
PLATELETS: 220 10*3/uL (ref 150–400)
RBC: 3.87 MIL/uL (ref 3.87–5.11)
RDW: 16 % — ABNORMAL HIGH (ref 11.5–15.5)
WBC: 12.1 10*3/uL — AB (ref 4.0–10.5)

## 2018-02-23 LAB — GLUCOSE, CAPILLARY
GLUCOSE-CAPILLARY: 237 mg/dL — AB (ref 70–99)
GLUCOSE-CAPILLARY: 313 mg/dL — AB (ref 70–99)
GLUCOSE-CAPILLARY: 280 mg/dL — AB (ref 70–99)
GLUCOSE-CAPILLARY: 243 mg/dL — AB (ref 70–99)

## 2018-02-23 MED ORDER — INSULIN ASPART 100 UNIT/ML ~~LOC~~ SOLN
6.0000 [IU] | Freq: Three times a day (TID) | SUBCUTANEOUS | Status: DC
  Administered 2018-02-23 – 2018-02-25 (×6): 6 [IU] via SUBCUTANEOUS

## 2018-02-23 NOTE — Progress Notes (Signed)
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  Patient was admitted for the management of possible drug-induced rash.  Ampicillin has been discontinued and she has been switched to vancomycin now.  Her hospital course is remarkable for persistent pruritus, worsening wheals.  She was also recently started on lisinopril which has been stopped.  Assessment & Plan:   Active Problems:   Allergic reaction caused by a drug  Allergic reaction possibly caused by drug: Currently we do not know the exact etiology of the rash.But we presume it is from ampicillin. We have further discontinued meloxicam, pregabalin and Robaxin which have been recently started about 2 months ago.  Lisinopril and ampicillin have been stopped.  Continue Solu-Medrol, Benadryl and Pepcid.  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  She was just discharged on ampicillin via PICC line.  The last day of antibiotic is 03/24/18.  ID was consulted on admission this time and her antibiotic was recommended to be changed to vancomycin.  I have discussed with Dr. Johnnye Sima on 02/22/18 who recommends to continue vancomycin.  The allergy could still be from ampicillin and might take time to resolve.  History of iron deficiency anemia: Hemoglobin of 9.1.  On iron supplement at home.  I have discontinued iron also for now.    Diabetes type 2: Hemoglobin A1c of 7.6 on 7/2 2/19.  Continue sliding-scale insulin  Hypertension: Currently blood pressure stable.  On metoprolol and amlodipine.  Lisinopril stopped.`   DVT prophylaxis: Lovenox Code Status:  Full Family Communication: Family present at the bedside Disposition Plan: Home after resolution of allergy   Consultants: None  Procedures: None  Antimicrobials: Vancomycin   Subjective: Patient seen and examined the bedside this morning. The rash has not progressed.She still has  rash/wheals on her bilateral thighs, legs and upper extremities.  No rash on the face.  No lip/tongue swelling.  Objective: Vitals:   02/22/18 0555 02/22/18 1345 02/22/18 2127 02/23/18 0557  BP: 138/78 (!) 147/83 (!) 159/97 (!) 143/83  Pulse: (!) 53 64 87 (!) 49  Resp: 18 18 18 18   Temp: 98.4 F (36.9 C) 98.1 F (36.7 C) 98.5 F (36.9 C) 98.2 F (36.8 C)  TempSrc: Oral Oral Oral Oral  SpO2: 100% 99% 100% 99%  Weight:      Height:        Intake/Output Summary (Last 24 hours) at 02/23/2018 1035 Last data filed at 02/23/2018 0602 Gross per 24 hour  Intake 2280 ml  Output 2100 ml  Net 180 ml   Filed Weights   02/19/18 0659  Weight: 77.1 kg (170 lb)    Examination:  General exam: Appears calm and comfortable ,Not in distress,average built HEENT:PERRL,Oral mucosa moist, Ear/Nose normal on gross exam Respiratory system: Bilateral equal air entry, normal vesicular breath sounds, no wheezes or crackles  Cardiovascular system: S1 & S2 heard, RRR. No JVD, murmurs, rubs, gallops or clicks. No pedal edema. Gastrointestinal system: Abdomen is nondistended, soft and nontender. No organomegaly or masses felt. Normal bowel sounds heard. Central nervous system: Alert and oriented. No focal neurological deficits. Extremities: No edema, no clubbing ,no cyanosis, distal peripheral pulses palpable.  Wheals on bilateral upper  thighs, legs and upper extremities.  Purpuric rash on bilateral forearms Skin: No ulcers,no icterus ,no pallor MSK: Normal muscle bulk,tone ,power Psychiatry: Judgement and insight appear normal. Mood & affect appropriate.     Data Reviewed: I have personally reviewed following labs and  imaging studies  CBC: Recent Labs  Lab 02/20/18 0409 02/22/18 0412 02/23/18 0349  WBC 8.6 13.7* 12.1*  NEUTROABS  --   --  10.6*  HGB 9.1* 9.4* 10.1*  HCT 27.0* 28.4* 30.4*  MCV 79.6 80.0 78.6  PLT 243 208 301   Basic Metabolic Panel: Recent Labs  Lab 02/20/18 0409 02/21/18 0413 02/22/18 0412  NA 140  --  138  K 4.2  --  3.9  CL 104  --  103  CO2 28  --  28  GLUCOSE 186*  --  165*  BUN 19  --  15  CREATININE 1.03* 0.86 0.73  CALCIUM 9.1  --  8.7*   GFR: Estimated Creatinine Clearance: 83.7 mL/min (by C-G formula based on SCr of 0.73 mg/dL). Liver Function Tests: Recent Labs  Lab 02/20/18 0409  AST 13*  ALT 11  ALKPHOS 55  BILITOT 0.3  PROT 8.3*  ALBUMIN 2.9*   No results for input(s): LIPASE, AMYLASE in the last 168 hours. No results for input(s): AMMONIA in the last 168 hours. Coagulation Profile: No results for input(s): INR, PROTIME in the last 168 hours. Cardiac Enzymes: No results for input(s): CKTOTAL, CKMB, CKMBINDEX, TROPONINI in the last 168 hours. BNP (last 3 results) No results for input(s): PROBNP in the last 8760 hours. HbA1C: Recent Labs    02/21/18 0854  HGBA1C 7.6*   CBG: Recent Labs  Lab 02/22/18 0851 02/22/18 1254 02/22/18 1819 02/22/18 2124 02/23/18 0805  GLUCAP 137* 330* 199* 297* 237*   Lipid Profile: No results for input(s): CHOL, HDL, LDLCALC, TRIG, CHOLHDL, LDLDIRECT in the last 72 hours. Thyroid Function Tests: No results for input(s): TSH, T4TOTAL, FREET4, T3FREE, THYROIDAB in the last 72 hours. Anemia Panel: No results for input(s): VITAMINB12, FOLATE, FERRITIN, TIBC, IRON, RETICCTPCT in the last 72 hours. Sepsis Labs: No results for input(s): PROCALCITON, LATICACIDVEN in the last 168 hours.  No results found for this or any previous visit (from the past 240 hour(s)).       Radiology Studies: No results found.      Scheduled Meds: . amLODipine  10 mg Oral Daily  . aspirin  81 mg Oral Daily  .  diphenhydrAMINE  25 mg Intravenous TID  . diphenhydrAMINE-zinc acetate   Topical TID  . enoxaparin (LOVENOX) injection  40 mg Subcutaneous Q24H  . insulin aspart  0-15 Units Subcutaneous TID WC  . insulin aspart  0-5 Units Subcutaneous QHS  . insulin aspart  6 Units Subcutaneous TID WC  . methylPREDNISolone (SOLU-MEDROL) injection  60 mg Intravenous Q8H  . metoprolol succinate  12.5 mg Oral QHS  . oxyCODONE-acetaminophen  1 tablet Oral TID   And  . oxyCODONE  5 mg Oral TID  . rosuvastatin  20 mg Oral q1800   Continuous Infusions: . sodium chloride 10 mL/hr at 02/23/18 1007  . famotidine (PEPCID) IV 20 mg (02/23/18 1005)  . vancomycin Stopped (02/22/18 1915)     LOS: 3 days    Time spent: 25 mins.More than 50% of that time was spent in counseling and/or coordination of care.      Shelly Coss, MD [facility] Pager [phone]  If 7PM-7AM, please contact night-coverage [email or web address] Password [email or web address] 02/23/2018, 10:35  AM  

## 2018-02-24 LAB — CBC WITH DIFFERENTIAL/PLATELET
BASOS ABS: 0 10*3/uL (ref 0.0–0.1)
BASOS PCT: 0 %
EOS ABS: 0 10*3/uL (ref 0.0–0.7)
EOS PCT: 0 %
HEMATOCRIT: 29.2 % — AB (ref 36.0–46.0)
Hemoglobin: 9.8 g/dL — ABNORMAL LOW (ref 12.0–15.0)
Lymphocytes Relative: 10 %
Lymphs Abs: 1.2 10*3/uL (ref 0.7–4.0)
MCH: 26.2 pg (ref 26.0–34.0)
MCHC: 33.6 g/dL (ref 30.0–36.0)
MCV: 78.1 fL (ref 78.0–100.0)
MONOS PCT: 3 %
Monocytes Absolute: 0.4 10*3/uL (ref 0.1–1.0)
NEUTROS ABS: 10.6 10*3/uL — AB (ref 1.7–7.7)
Neutrophils Relative %: 87 %
PLATELETS: 187 10*3/uL (ref 150–400)
RBC: 3.74 MIL/uL — ABNORMAL LOW (ref 3.87–5.11)
RDW: 15.9 % — AB (ref 11.5–15.5)
WBC: 12.2 10*3/uL — ABNORMAL HIGH (ref 4.0–10.5)

## 2018-02-24 LAB — BASIC METABOLIC PANEL
Anion gap: 6 (ref 5–15)
BUN: 21 mg/dL — AB (ref 6–20)
CHLORIDE: 102 mmol/L (ref 98–111)
CO2: 30 mmol/L (ref 22–32)
Calcium: 8.6 mg/dL — ABNORMAL LOW (ref 8.9–10.3)
Creatinine, Ser: 0.76 mg/dL (ref 0.44–1.00)
GFR calc Af Amer: >60 mL/min (ref >60)
GFR calc non Af Amer: >60 mL/min (ref >60)
GLUCOSE: 230 mg/dL — AB (ref 70–99)
Potassium: 4.1 mmol/L (ref 3.5–5.1)
SODIUM: 138 mmol/L (ref 135–145)

## 2018-02-24 LAB — GLUCOSE, CAPILLARY
GLUCOSE-CAPILLARY: 242 mg/dL — AB (ref 70–99)
GLUCOSE-CAPILLARY: 301 mg/dL — AB (ref 70–99)
GLUCOSE-CAPILLARY: 180 mg/dL — AB (ref 70–99)
GLUCOSE-CAPILLARY: 366 mg/dL — AB (ref 70–99)
Glucose-Capillary: 314 mg/dL — ABNORMAL HIGH (ref 70–99)
Glucose-Capillary: 211 mg/dL — ABNORMAL HIGH (ref 70–99)
Glucose-Capillary: 221 mg/dL — ABNORMAL HIGH (ref 70–99)

## 2018-02-24 MED ORDER — FAMOTIDINE 20 MG PO TABS
20.0000 mg | ORAL_TABLET | Freq: Two times a day (BID) | ORAL | Status: DC
  Administered 2018-02-24 – 2018-02-25 (×2): 20 mg via ORAL
  Filled 2018-02-24 (×2): qty 1

## 2018-02-24 MED ORDER — LORATADINE 10 MG PO TABS
10.0000 mg | ORAL_TABLET | Freq: Every day | ORAL | Status: DC
  Administered 2018-02-24 – 2018-02-25 (×2): 10 mg via ORAL
  Filled 2018-02-24 (×2): qty 1

## 2018-02-24 MED ORDER — DIPHENHYDRAMINE HCL 50 MG/ML IJ SOLN: 25.0000 mg | Freq: Four times a day (QID) | INTRAMUSCULAR | Status: DC | PRN

## 2018-02-24 MED ORDER — HYDROCORTISONE 1 % EX CREA
TOPICAL_CREAM | Freq: Four times a day (QID) | CUTANEOUS | Status: DC
  Administered 2018-02-24 – 2018-02-25 (×4): via TOPICAL
  Administered 2018-02-25: 1 via TOPICAL
  Filled 2018-02-24: qty 28

## 2018-02-24 NOTE — Progress Notes (Signed)
PROGRESS NOTE    [name]  SNK:[id number] DOB: 02-16-1966 DOA: 02/19/2018 PCP: Benito Mccreedy, MD   Brief Narrative: Patient is a 52 year old female with past medical history of hypertension, diabetes type 2, recently discharged due to strep viridans discitis /osteomyelitis on ampicillin who presents to the emergency department with complaints of itching, rash and swelling along with wheals.  Patient was admitted for the management of possible drug-induced rash.  Ampicillin has been discontinued and she has been switched to vancomycin now.  Her hospital course is remarkable for persistent pruritus, worsening wheals.  She was also recently started on lisinopril which has been stopped.  Assessment & Plan:   Active Problems:   Allergic reaction caused by a drug  Utricaria/Allergic reaction possibly caused by drug: Currently we do not know the exact etiology of the rash.But we presume it is from ampicillin. We have further discontinued meloxicam, pregabalin and Robaxin which have been recently started about 2 months ago.  Lisinopril and ampicillin have been stopped.  Continue Solu-Medrol, Benadryl and Pepcid.Also started on hydrocortisone cream today.  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  She was just discharged on ampicillin via PICC line.  The last day of antibiotic is 03/24/18.  ID was consulted on admission this time and her antibiotic was recommended to be changed to vancomycin.  I have discussed with Dr. Johnnye Sima on 02/22/18 who recommends to continue vancomycin.  The allergy could still be from ampicillin and might take time to resolve.  History of iron deficiency anemia: Hemoglobin of 9.1.  On iron supplement at home.  I have discontinued iron also for now.    Diabetes type 2: Hemoglobin A1c of 7.6 on 7/2 2/19.  Continue sliding-scale insulin  Hypertension: Currently blood pressure stable.  On metoprolol and amlodipine.  Lisinopril  stopped.`   DVT prophylaxis: Lovenox Code Status: Full Family Communication: Family present at the bedside Disposition Plan: Home after resolution of utricaria.Likely tomorow  Consultants: None  Procedures: None  Antimicrobials: Vancomycin   Subjective: Patient seen and examined the bedside this morning. The rash has not progressed.Wheals on her bilateral thighs, legs and upper extremities are improving. No rash on the face.  No lip/tongue swelling.  Still complains of severe itching during sleep.  Objective: Vitals:   02/23/18 1350 02/23/18 2110 02/24/18 0521 02/24/18 0600  BP: (!) 142/86 131/78 (!) 157/91   Pulse: (!) 55 (!) 59 (!) 49 64  Resp: 17 14 14    Temp: 98 F (36.7 C) 98.4 F (36.9 C) 98.2 F (36.8 C)   TempSrc: Oral Oral Oral   SpO2: 100% 100% 100%   Weight:      Height:        Intake/Output Summary (Last 24 hours) at 02/24/2018 1126 Last data filed at 02/24/2018 0600 Gross per 24 hour  Intake 1721.16 ml  Output -  Net 1721.16 ml   Filed Weights   02/19/18 0659  Weight: 77.1 kg (170 lb)    Examination:  General exam: Appears calm and comfortable ,Not in distress,average built HEENT:PERRL,Oral mucosa moist, Ear/Nose normal on gross exam Respiratory system: Bilateral equal air entry, normal vesicular breath sounds, no wheezes or crackles  Cardiovascular system: S1 & S2 heard, RRR. No JVD, murmurs, rubs, gallops or clicks. No pedal edema. Gastrointestinal system: Abdomen is nondistended, soft and nontender. No organomegaly or masses felt. Normal bowel sounds heard. Central nervous system: Alert and oriented. No focal neurological deficits. Extremities: No edema, no clubbing ,no cyanosis, distal peripheral pulses  palpable.  Improving wheals on bilateral upper thighs, legs and upper extremities.  Purpuric rash on bilateral forearms  Skin: No ulcers,no icterus ,no pallor MSK: Normal muscle bulk,tone ,power Psychiatry: Judgement and insight appear normal. Mood  & affect appropriate.     Data Reviewed: I have personally reviewed following labs and imaging studies  CBC: Recent Labs  Lab 02/20/18 0409 02/22/18 0412 02/23/18 0349 02/24/18 0338  WBC 8.6 13.7* 12.1* 12.2*  NEUTROABS  --   --  10.6* 10.6*  HGB 9.1* 9.4* 10.1* 9.8*  HCT 27.0* 28.4* 30.4* 29.2*  MCV 79.6 80.0 78.6 78.1  PLT 243 208 220 712   Basic Metabolic Panel: Recent Labs  Lab 02/20/18 0409 02/21/18 0413 02/22/18 0412 02/24/18 0338  NA 140  --  138 138  K 4.2  --  3.9 4.1  CL 104  --  103 102  CO2 28  --  28 30  GLUCOSE 186*  --  165* 230*  BUN 19  --  15 21*  CREATININE 1.03* 0.86 0.73 0.76  CALCIUM 9.1  --  8.7* 8.6*   GFR: Estimated Creatinine Clearance: 83.7 mL/min (by C-G formula based on SCr of 0.76 mg/dL). Liver Function Tests: Recent Labs  Lab 02/20/18 0409  AST 13*  ALT 11  ALKPHOS 55  BILITOT 0.3  PROT 8.3*  ALBUMIN 2.9*   No results for input(s): LIPASE, AMYLASE in the last 168 hours. No results for input(s): AMMONIA in the last 168 hours. Coagulation Profile: No results for input(s): INR, PROTIME in the last 168 hours. Cardiac Enzymes: No results for input(s): CKTOTAL, CKMB, CKMBINDEX, TROPONINI in the last 168 hours. BNP (last 3 results) No results for input(s): PROBNP in the last 8760 hours. HbA1C: No results for input(s): HGBA1C in the last 72 hours. CBG: Recent Labs  Lab 02/23/18 1729 02/23/18 2130 02/24/18 0722 02/24/18 0725 02/24/18 0849  GLUCAP 243* 314* 211* 242* 221*   Lipid Profile: No results for input(s): CHOL, HDL, LDLCALC, TRIG, CHOLHDL, LDLDIRECT in the last 72 hours. Thyroid Function Tests: No results for input(s): TSH, T4TOTAL, FREET4, T3FREE, THYROIDAB in the last 72 hours. Anemia Panel: No results for input(s): VITAMINB12, FOLATE, FERRITIN, TIBC, IRON, RETICCTPCT in the last 72 hours. Sepsis Labs: No results for input(s): PROCALCITON, LATICACIDVEN in the last 168 hours.  No results found for this or any  previous visit (from the past 240 hour(s)).       Radiology Studies: No results found.      Scheduled Meds: . amLODipine  10 mg Oral Daily  . aspirin  81 mg Oral Daily  . enoxaparin (LOVENOX) injection  40 mg Subcutaneous Q24H  . famotidine  20 mg Oral BID  . hydrocortisone cream   Topical QID  . insulin aspart  0-15 Units Subcutaneous TID WC  . insulin aspart  0-5 Units Subcutaneous QHS  . insulin aspart  6 Units Subcutaneous TID WC  . loratadine  10 mg Oral Daily  . methylPREDNISolone (SOLU-MEDROL) injection  60 mg Intravenous Q8H  . metoprolol succinate  12.5 mg Oral QHS  . oxyCODONE-acetaminophen  1 tablet Oral TID   And  . oxyCODONE  5 mg Oral TID  . rosuvastatin  20 mg Oral q1800   Continuous Infusions: . sodium chloride 10 mL/hr at 02/23/18 1852  . vancomycin Stopped (02/23/18 1940)     LOS: 4 days    Time spent: 25 mins.More than 50% of that time was spent in counseling and/or coordination of care.  Shelly Coss, MD [facility] Pager [phone]  If 7PM-7AM, please contact night-coverage [email or web address] Password [email or web address] 02/24/2018, 11:26 AM

## 2018-02-24 NOTE — Progress Notes (Signed)
PHARMACIST - PHYSICIAN COMMUNICATION  DR:   Tawanna Solo  CONCERNING: IV to Oral Route Change Policy  RECOMMENDATION: This patient is receiving famotidine by the intravenous route.  Based on criteria approved by the Pharmacy and Therapeutics Committee, the intravenous medication(s) is/are being converted to the equivalent oral dose form(s).   DESCRIPTION: These criteria include:  The patient is eating (either orally or via tube) and/or has been taking other orally administered medications for a least 24 hours  The patient has no evidence of active gastrointestinal bleeding or impaired GI absorption (gastrectomy, short bowel, patient on TNA or NPO).  If you have questions about this conversion, please contact the [place], [facility] 02/24/2018 10:22 AM

## 2018-02-24 NOTE — Progress Notes (Signed)
Patient was previously active with [facility] with RN for IV abx and PT. Contacted [facility] for referral. They will follow for d/c needs. [phone]

## 2018-02-24 NOTE — Progress Notes (Signed)
[facility]  Active pt with [facility] for [facility] For planned DC home tomorrow, 02-25-18.  Met with pt to discuss plan for home with new IV ABX- vancomycin.  Teaching reviewed with pt and husband by phone. Husband not present at this time. Spoke with  Graylin Shiver, RPh regarding back up dose times but pt will have Vanco peak/trough drawn with 6 PM dose.  Plan will be for pt to receive dose here at hospital tomorrow before DC home. [facility] HHRN will see pt between 1-4 PM on Sunday for full teach with actual first home Vancomycin dose. Husband also aware and agreement with plan.   [place] weekend team will follow to support transition home this weekend. If patient discharges after hours, please call [phone].   Larry Sierras 02/24/2018, 12:58 PM

## 2018-02-25 LAB — CREATININE, SERUM
CREATININE: 0.71 mg/dL (ref 0.44–1.00)
GFR calc non Af Amer: >60 mL/min (ref >60)

## 2018-02-25 LAB — GLUCOSE, CAPILLARY: GLUCOSE-CAPILLARY: 381 mg/dL — AB (ref 70–99)

## 2018-02-25 LAB — VANCOMYCIN, RANDOM: Vancomycin Rm: 8

## 2018-02-25 LAB — VANCOMYCIN, PEAK: VANCOMYCIN PK: 26 ug/mL — AB (ref 30–40)

## 2018-02-25 MED ORDER — VANCOMYCIN IV (FOR PTA / DISCHARGE USE ONLY): 1000.0000 mg | Freq: Two times a day (BID) | INTRAVENOUS | 0 refills | Status: DC

## 2018-02-25 MED ORDER — VANCOMYCIN HCL IN DEXTROSE 1-5 GM/200ML-% IV SOLN
1000.0000 mg | Freq: Once | INTRAVENOUS | Status: AC
  Administered 2018-02-25: 1000 mg via INTRAVENOUS
  Filled 2018-02-25: qty 200

## 2018-02-25 MED ORDER — HEPARIN SOD (PORK) LOCK FLUSH 100 UNIT/ML IV SOLN
250.0000 [IU] | INTRAVENOUS | Status: AC | PRN
  Administered 2018-02-25: 250 [IU]

## 2018-02-25 MED ORDER — VANCOMYCIN IV (FOR PTA / DISCHARGE USE ONLY): 1250.0000 mg | INTRAVENOUS | 0 refills | Status: DC

## 2018-02-25 MED ORDER — AMLODIPINE BESYLATE 10 MG PO TABS: 10.0000 mg | ORAL_TABLET | Freq: Every day | ORAL | 0 refills | Status: AC

## 2018-02-25 MED ORDER — HYDROCORTISONE 1 % EX CREA: TOPICAL_CREAM | Freq: Four times a day (QID) | CUTANEOUS | 0 refills | Status: DC

## 2018-02-25 MED ORDER — FAMOTIDINE 20 MG PO TABS: 20.0000 mg | ORAL_TABLET | Freq: Two times a day (BID) | ORAL | 0 refills | Status: DC

## 2018-02-25 MED ORDER — PREDNISONE 10 MG PO TABS: ORAL_TABLET | ORAL | 0 refills | Status: DC

## 2018-02-25 MED ORDER — LORATADINE 10 MG PO TABS: 10.0000 mg | ORAL_TABLET | Freq: Every day | ORAL | 0 refills | Status: DC

## 2018-02-25 NOTE — Progress Notes (Signed)
Pharmacy Antibiotic Note  [name] is a 52 y.o. female admitted on 02/19/2018 with allergic reaction to antibiotic.  Patient had been on IV ampicillin at home for discitis/osteomyelitis and presents with allergic reaction (rash and itching).  ID recommends changing to Vancomycin. Pharmacy has been consulted for Vancomycin dosing.  02/25/18  Vanc Pk 7/5 at 2116 (dose of 1250mg  IV q24 given 7/5 at 1745) = 26  Vanc Tr 7/6 at 1118 = 8  Calculated AUC of 338 (goal 400-550)  Stable SCr  Plan:  Change vanc from 1250mg  IV q24 to 1g IV q12 for new est AUC of 540 with est Peak of 34 and Trough of 14  Recheck vanc levels after discharge prior to 4th or 5th dose  Follow renal function  Check levels when appropriate  Height: 5\' 4"  (162.6 cm) Weight: 170 lb (77.1 kg) IBW/kg (Calculated) : 54.7  Temp (24hrs), Avg:97.9 F (36.6 C), Min:97.6 F (36.4 C), Max:98.4 F (36.9 C)  Recent Labs  Lab 02/20/18 0409 02/21/18 0413 02/22/18 0412 02/23/18 0349 02/24/18 0338 02/24/18 2116 02/25/18 0315 02/25/18 1118  WBC 8.6  --  13.7* 12.1* 12.2*  --   --   --   CREATININE 1.03* 0.86 0.73  --  0.76  --  0.71  --   VANCOPEAK  --   --   --   --   --  26*  --   --   VANCORANDOM  --   --   --   --   --   --   --  8    Estimated Creatinine Clearance: 83.7 mL/min (by C-G formula based on SCr of 0.71 mg/dL).    Allergies  Allergen Reactions  . Ampicillin Hives and Itching    Has patient had a PCN reaction causing immediate rash, facial/tongue/throat swelling, SOB or lightheadedness with hypotension: Y Has patient had a PCN reaction causing severe rash involving mucus membranes or skin necrosis: Y Has patient had a PCN reaction that required hospitalization: Y Has patient had a PCN reaction occurring within the last 10 years: Y If all of the above answers are "NO", then may proceed with Cephalosporin use.     Antimicrobials this admission: 6/30 vanc >>     Thank you for allowing  pharmacy to be a part of this patient's care.   Adrian Saran, PharmD, BCPS Pager [phone] 02/25/2018 12:09 PM

## 2018-02-25 NOTE — Discharge Summary (Addendum)
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  Patient was admitted for the management of possible drug-induced rash.  Ampicillin has been discontinued and she has been switched to vancomycin now.  Her hospital course is remarkable for persistent pruritus, worsening wheals.  She was also recently started on lisinopril which has been stopped.  Currently the allergic rash is slowly getting better with the steroids, H1/H2 blockers.  Patient is being discharged to home today with oral medications.  Following problems were addressed during her hospitalization:  Utricaria/Allergic reaction possibly caused by drug:Most likely drug rash associated with ampicillin.She was also in lisinopril at home. Lisinopril and ampicillin have been stopped.  She was started on  Solu-Medrol, Benadryl and Pepcid.Also started on hydrocortisone cream .  She will be discharged on oral steroids.  Allergic rash improving.  History of Streptococcus viridans bacteremia/ discitis: She was on ampicillin at home.  She was just discharged on ampicillin via PICC line for Streptococcus viridans bacteremia and L5-S1 discitis. The last day of antibiotic is 03/24/18.  ID was consulted on admission this time and her antibiotic was recommended to be changed to vancomycin.  I had discussed with Dr. Johnnye Sima on 02/22/18 who recommends to continue vancomycin.  The allergy could  be from ampicillin and  might take time to resolve.  History of iron deficiency anemia: Hemoglobin of 9.1. Stable.  Diabetes type 2: Hemoglobin A1c of 7.6 on 7/2 2/19.  Continue home metformin.  Hypertension: Currently blood pressure stable.  On metoprolol and amlodipine. Lisinopril stopped.     Discharge Diagnoses:  Active Problems:   Allergic reaction caused by a drug    Discharge Instructions  Discharge Instructions    Diet - low sodium heart healthy   Complete by:  As directed    Discharge instructions   Complete by:  As directed    1) Take prescribed medications as instructed. 2) Please follow up with your PCP in a week.Do a CBC and BMP test during the follow up. 3) Follow up with infectious disease as an outpatient.   Home infusion instructions [facility] May follow [place] Dosing Protocol; May administer Cathflo as needed to maintain patency of vascular access device.; Flushing of vascular access device: per [facility] Protocol: 0.9% NaCl pre/post medica...   Complete by:  As directed    Instructions:  May follow [place] Dosing Protocol   Instructions:  May administer Cathflo as needed to maintain patency of vascular access device.   Instructions:  Flushing of vascular access device: per [facility] Protocol: 0.9% NaCl pre/post medication administration and prn patency; Heparin 100 u/ml, 20m for implanted ports and Heparin 10u/ml, 512mfor all other central venous catheters.   Instructions:  May follow [facility] Anaphylaxis Protocol for First Dose Administration in the home: 0.9% NaCl at 25-50 ml/hr to maintain IV access for protocol meds. Epinephrine 0.3 ml IV/IM PRN and Benadryl 25-50 IV/IM PRN s/s of anaphylaxis.   Instructions:  [facility] Coord[facility] (RN) to assist per patient IV care needs in the home PRN.  Home infusion instructions [facility] May follow [place] Dosing Protocol; May administer Cathflo as needed to maintain patency of vascular access device.;  Flushing of vascular access device: per [facility] Protocol: 0.9% NaCl pre/post medica...   Complete by:  As directed    Instructions:  May follow [place] Dosing Protocol   Instructions:  May administer Cathflo as needed to maintain patency of vascular access device.   Instructions:  Flushing of vascular access device: per [facility] Protocol: 0.9% NaCl pre/post medication administration and prn patency; Heparin 100 u/ml, 74m for implanted ports and Heparin 10u/ml, 526mfor all other central venous catheters.   Instructions:  May follow [facility] Anaphylaxis Protocol for First Dose Administration in the home: 0.9% NaCl at 25-50 ml/hr to maintain IV access for protocol meds. Epinephrine 0.3 ml IV/IM PRN and Benadryl 25-50 IV/IM PRN s/s of anaphylaxis.   Instructions:  [facility] Coord[facility] (RN) to assist per patient IV care needs in the home PRN.   Increase activity slowly   Complete by:  As directed      Allergies as of 02/25/2018      Reactions   Ampicillin Hives, Itching   Has patient had a PCN reaction causing immediate rash, facial/tongue/throat swelling, SOB or lightheadedness with hypotension: Y Has patient had a PCN reaction causing severe rash involving mucus membranes or skin necrosis: Y Has patient had a PCN reaction that required hospitalization: Y Has patient had a PCN reaction occurring within the last 10 years: Y If all of the above answers are "NO", then may proceed with Cephalosporin use.      Medication List    STOP taking these medications   lisinopril 10 MG tablet Commonly known as:  PRINIVIL,ZESTRIL   ondansetron 4 MG tablet Commonly known as:  ZOFRAN     TAKE these medications   amLODipine 10 MG tablet Commonly known as:  NORVASC Take 1 tablet (10 mg total) by mouth daily. Start taking on:  02/26/2018   aspirin 81 MG tablet Take 81 mg by mouth daily.   calcium-vitamin D 500-200 MG-UNIT tablet Take 1 tablet by mouth 2 (two) times daily with a meal.    famotidine 20 MG tablet Commonly known as:  PEPCID Take 1 tablet (20 mg total) by mouth 2 (two) times daily for 7 days.   hydrocortisone cream 1 % Apply topically 4 (four) times daily. Apply to itchy areas   loratadine 10 MG tablet Commonly known as:  CLARITIN Take 1 tablet (10 mg total) by mouth daily. Start taking on:  02/26/2018   meloxicam 15 MG tablet Commonly known as:  MOBIC Take 15 mg by mouth daily.   metFORMIN 500 MG tablet Commonly known as:  GLUCOPHAGE Take 500 mg by mouth daily.   methocarbamol 500 MG tablet Commonly known as:  ROBAXIN Take 500 mg by mouth 2 (two) times daily.   metoprolol succinate 25 MG 24 hr tablet Commonly known as:  TOPROL-XL Take 12.5 mg by mouth daily.   ondansetron 4 MG disintegrating tablet Commonly known as:  ZOFRAN-ODT Take 1 tablet (4 mg total) by mouth every 8 (eight) hours as needed for nausea or vomiting.   oxyCODONE-acetaminophen 10-325 MG tablet Commonly known as:  PERCOCET Take 1 tablet by mouth 3 (three) times daily.   predniSONE 10 MG tablet Commonly known as:  DELTASONE Take 4 pills daily for 3 days then 2 pills daily for 3 days then 1 pill daily for 3 days then stop Start  taking on:  02/26/2018   pregabalin 50 MG capsule Commonly known as:  LYRICA Take 50 mg by mouth 3 (three) times daily.   rosuvastatin 20 MG tablet Commonly known as:  CRESTOR Take 20 mg by mouth daily.   vancomycin IVPB Inject 1,000 mg into the vein every 12 (twelve) hours for 27 days. Indication:  OM/discitis Last Day of Therapy:  03/24/18 Labs - 'Sunday/Monday:  CBC/D, BMP, and vancomycin trough. Labs - Thursday:  BMP and vancomycin trough Labs - Every other week:  ESR and CRP   Vitamin D (Ergocalciferol) 50000 units Caps capsule Commonly known as:  DRISDOL TAKE 1 CAPSULE BY MOUTH TWICE A WEEK            Home Infusion Instuctions  (From admission, onward)        Start     Ordered   02/25/18 0000  Home infusion instructions  [facility] May follow [facility] Dosing Protocol; May administer Cathflo as needed to maintain patency of vascular access device.; Flushing of vascular access device: per [facility] Protocol: 0.9% NaCl pre/post medica...    Question Answer Comment  Instructions May follow [facility] Dosing Protocol   Instructions May administer Cathflo as needed to maintain patency of vascular access device.   Instructions Flushing of vascular access device: per [facility] Protocol: 0.9% NaCl pre/post medication administration and prn patency; Heparin 100 u/ml, 5ml for implanted ports and Heparin 10u/ml, 5ml for all other central venous catheters.   Instructions May follow [facility] Anaphylaxis Protocol for First Dose Administration in the home: 0.9% NaCl at 25-50 ml/hr to maintain IV access for protocol meds. Epinephrine 0.3 ml IV/IM PRN and Benadryl 25-50 IV/IM PRN s/s of anaphylaxis.   Instructions [facility] Infusion Coordinator (RN) to assist per patient IV care needs in the home PRN.      02/25/18 1025   02/25/18 0000  Home infusion instructions [facility] May follow [facility] Dosing Protocol; May administer Cathflo as needed to maintain patency of vascular access device.; Flushing of vascular access device: per [facility] Protocol: 0.9% NaCl pre/post medica...    Question Answer Comment  Instructions May follow [facility] Dosing Protocol   Instructions May administer Cathflo as needed to maintain patency of vascular access device.   Instructions Flushing of vascular access device: per [facility] Protocol: 0.9% NaCl pre/post medication administration and prn patency; Heparin 100 u/ml, 5ml for implanted ports and Heparin 10u/ml, 5ml for all other central venous catheters.   Instructions May follow [facility] Anaphylaxis Protocol for First Dose Administration in the home: 0.9% NaCl at 25-50 ml/hr to maintain IV access for protocol meds. Epinephrine 0.3 ml IV/IM PRN and Benadryl 25-50 IV/IM PRN s/s of anaphylaxis.    Instructions [facility] Infusion Coordinator (RN) to assist per patient IV care needs in the home PRN.      07'$ /06/19 1235     Follow-up Information    Comer, Okey Regal, MD. Schedule an appointment as soon as possible for a visit in 2 week(s).   Specialty:  Infectious Diseases Contact information: [address] [place] [address] [phone]        Benito Mccreedy, MD. Schedule an appointment as soon as possible for a visit in 1 week(s).   Specialty:  Internal Medicine Contact information: [address] [place] [address] [phone]        [facility], [facility]-Home Follow up.   Specialty:  Home Health Services Why:  Home Health RN- agency will call to arrange initial appointment Contact information: [place] [address] [phone]          Allergies  Allergen Reactions  . Ampicillin Hives and Itching    Has patient had a PCN reaction causing immediate rash, facial/tongue/throat swelling, SOB or lightheadedness with hypotension: Y Has patient had a PCN reaction causing severe rash involving mucus membranes or skin necrosis: Y Has patient had a PCN reaction that required hospitalization: Y Has patient had a PCN reaction occurring within the last 10 years: Y If all of the above answers are "NO", then may proceed with Cephalosporin use.     Consultations: None  Procedures/Studies: Dg Chest Port 1 View  Result Date: 01/27/2018 CLINICAL DATA:  Cough. EXAM: PORTABLE CHEST 1 VIEW COMPARISON:  CT chest 06/27/2014.  Chest x-ray 03/26/2012. FINDINGS: 0623 hours. Stable mild asymmetric elevation right hemidiaphragm. The lungs are clear without focal pneumonia, edema, pneumothorax or pleural effusion. The cardio pericardial silhouette is enlarged. The visualized bony structures of the thorax are intact. Telemetry leads overlie the chest. IMPRESSION: No active disease. Electronically Signed   By: Misty Stanley M.D.   On: 01/27/2018 07:01   [place] Ekg Site Rite  Result Date: 01/30/2018 If Site Rite image not attached, placement could not be confirmed due to current cardiac rhythm.     Subjective: Patient seen and examined the bedside this morning.  Remains comfortable.  Hemodynamically stable.  The wheels/allergic rash have been improving.  Stable for discharge home today.  Discharge Exam: Vitals:   02/24/18 2248 02/25/18 0619  BP: (!) 150/83 139/78  Pulse: (!) 58 (!) 57  Resp: 16 16  Temp: 97.6 F (36.4 C) 97.8 F (36.6 C)  SpO2: 100% 100%   Vitals:   02/24/18 0600 02/24/18 1455 02/24/18 2248 02/25/18 0619  BP:  (!) 142/82 (!) 150/83 139/78  Pulse: 64 (!) 59 (!) 58 (!) 57  Resp:  '16 16 16  '$ Temp:  98.4 F (36.9 C) 97.6 F (36.4 C) 97.8 F (36.6 C)  TempSrc:  Oral Oral Oral  SpO2:  100% 100% 100%  Weight:      Height:        General: Pt is alert, awake, not in acute distress Cardiovascular: RRR, S1/S2 +, no rubs, no gallops Respiratory: CTA bilaterally, no wheezing, no rhonchi Abdominal: Soft, NT, ND, bowel sounds + Extremities: no edema, no cyanosis    The results of significant diagnostics from this hospitalization (including imaging, microbiology, ancillary and laboratory) are listed below for reference.     Microbiology: No results found for this or any previous visit (from the past 240 hour(s)).   Labs: BNP (last 3 results) No results for input(s): BNP in the last 8760 hours. Basic Metabolic Panel: Recent Labs  Lab 02/20/18 0409 02/21/18 0413 02/22/18 0412 02/24/18 0338 02/25/18 0315  NA 140  --  138 138  --   K 4.2  --  3.9 4.1  --   CL 104  --  103 102  --   CO2 28  --  28 30  --   GLUCOSE 186*  --  165* 230*  --   BUN 19  --  15 21*  --   CREATININE 1.03* 0.86 0.73 0.76 0.71  CALCIUM 9.1  --  8.7* 8.6*  --    Liver Function Tests: Recent Labs  Lab 02/20/18 0409  AST 13*  ALT 11  ALKPHOS 55  BILITOT 0.3  PROT 8.3*  ALBUMIN 2.9*    No results for input(s): LIPASE, AMYLASE in the last 168 hours. No results for input(s): AMMONIA in the last 168 hours. CBC: Recent Labs  Lab 02/20/18 0409 02/22/18 0412 02/23/18 0349 02/24/18 0338  WBC 8.6 13.7* 12.1* 12.2*  NEUTROABS  --   --  10.6* 10.6*  HGB 9.1* 9.4* 10.1* 9.8*  HCT 27.0* 28.4* 30.4* 29.2*  MCV 79.6 80.0 78.6 78.1  PLT 243 208 220 187   Cardiac Enzymes: No results for input(s): CKTOTAL, CKMB, CKMBINDEX, TROPONINI in the last 168 hours. BNP: Invalid input(s): POCBNP CBG: Recent Labs  Lab 02/24/18 0849 02/24/18 1317 02/24/18 1733 02/24/18 2246 02/25/18 1222  GLUCAP 221* 301* 180* 366* 381*   D-Dimer No results for input(s): DDIMER in the last 72 hours. Hgb A1c No results for input(s): HGBA1C in the last 72 hours. Lipid Profile No results for input(s): CHOL, HDL, LDLCALC, TRIG, CHOLHDL, LDLDIRECT in the last 72 hours. Thyroid function studies No results for input(s): TSH, T4TOTAL, T3FREE, THYROIDAB in the last 72 hours.  Invalid input(s): FREET3 Anemia work up No results for input(s): VITAMINB12, FOLATE, FERRITIN, TIBC, IRON, RETICCTPCT in the last 72 hours. Urinalysis    Component Value Date/Time   COLORURINE YELLOW 08/01/2013 2111   APPEARANCEUR CLEAR 08/01/2013 2111   LABSPEC 1.008 08/01/2013 2111   PHURINE 6.0 08/01/2013 2111   GLUCOSEU NEGATIVE 08/01/2013 2111   HGBUR SMALL (A) 08/01/2013 2111   [place] NEGATIVE 08/01/2013 2111   [place] 08/01/2013 2111   PROTEINUR NEGATIVE 08/01/2013 2111   UROBILINOGEN 0.2 08/01/2013 2111   NITRITE NEGATIVE 08/01/2013 2111   LEUKOCYTESUR SMALL (A) 08/01/2013 2111   Sepsis Labs Invalid input(s): PROCALCITONIN,  WBC,  LACTICIDVEN Microbiology No results found for this or any previous visit (from the past 240 hour(s)).  Please note: You were cared for by a hospitalist during your hospital stay. Once you are discharged, your primary care physician will handle any further medical  issues. Please note that NO REFILLS for any discharge medications will be authorized once you are discharged, as it is imperative that you return to your primary care physician (or establish a relationship with a primary care physician if you do not have one) for your post hospital discharge needs so that they can reassess your need for medications and monitor your lab values.    Time coordinating discharge: 40 minutes  SIGNED:   Shelly Coss, MD  [facility] 02/25/2018, 12:36 PM Pager [phone]  If 7PM-7AM, please contact night-coverage [email or web address] Password [email or web address]

## 2018-02-25 NOTE — Care Management Note (Addendum)
Case Management Note  Patient Details  Name: Yuka Lallier MRN: [id number] Date of Birth: [birth year]  Subjective/Objective:   Utricaria/alleric reaction possibly caused by drug, osteomyelitis                 Action/Plan: Contacted [facility] to make aware of dc home with new IV abx. [facility] HH RN scheduled start of care 7/7. Unit RN will administer today's dose prior to dc.  02/25/2018 1:10 pm received call from [facility] and they do not have [place] available until Tuesday. Requesting NCM arrange with another agency. The pharmacy will provide IV  medications.    02/25/2018 2:44 pm NCM spoke to attending, pt can dc home and IV Vanc can start q 12 hours 02/26/2018. Notified [facility] and they will be able to start of care 7/7 and provide [facility] to administer.   Expected Discharge Date:  02/25/18               Expected Discharge Plan:  [place]  In-House Referral:  NA  Discharge planning Services  CM Consult  Post Acute Care Choice:  Home Health, Resumption of Svcs/PTA Provider Choice offered to:  Patient  DME Arranged:  IV pump/equipment DME Agency:  [facility] Arranged:  RN, IV Antibiotics, PT [place] Agency:  [place]  Status of Service:  Completed, signed off  If discussed at [place] of Stay Meetings, dates discussed:    Additional Comments:  Erenest Rasher, RN 02/25/2018, 11:15 AM

## 2018-02-25 NOTE — Progress Notes (Signed)
Assessment unchanged. Pt and husband verbalized understanding of dc instructions through teach back regarding follow up care, when to call the doctor, and medications to resume as well as new meds. Scripts given per MD. HH due to see pt at home tomorrow. PICC line capped by IV team RN. Pt prepared and discharged via wc to front entrance to meet husband accompanied by NT.

## 2018-02-25 NOTE — Progress Notes (Signed)
PHARMACY CONSULT NOTE FOR:  OUTPATIENT  PARENTERAL ANTIBIOTIC THERAPY (OPAT)  Indication: OM/discitis Regimen: Vancomycin 1g IV q12 (changed from 1250mg  IV q24 End date: 03/24/18  IV antibiotic discharge orders are pended. To discharging provider:  please sign these orders via discharge navigator,  Select New Orders & click on the button choice - Manage This Unsigned Work.     Thank you for allowing pharmacy to be a part of this patient's care.  Adrian Saran, PharmD, BCPS Pager [phone] 02/25/2018 12:09 PM

## 2018-02-26 ENCOUNTER — Other Ambulatory Visit
Admission: RE | Admit: 2018-02-26 | Discharge: 2018-02-26 | Disposition: A | Discharge status: Home / Self Care | Payer: Federal, State, Local not specified - PPO | Source: Ambulatory Visit | Attending: Internal Medicine | Admitting: Internal Medicine

## 2018-02-26 DIAGNOSIS — M4626 Osteomyelitis of vertebra, lumbar region: Secondary | ICD-10-CM | POA: Diagnosis not present

## 2018-02-26 DIAGNOSIS — M4646 Discitis, unspecified, lumbar region: Secondary | ICD-10-CM | POA: Insufficient documentation

## 2018-02-26 DIAGNOSIS — A409 Streptococcal sepsis, unspecified: Secondary | ICD-10-CM | POA: Diagnosis not present

## 2018-02-26 DIAGNOSIS — Z452 Encounter for adjustment and management of vascular access device: Secondary | ICD-10-CM | POA: Diagnosis not present

## 2018-02-26 DIAGNOSIS — N340 Urethral abscess: Secondary | ICD-10-CM | POA: Diagnosis not present

## 2018-02-26 DIAGNOSIS — E1169 Type 2 diabetes mellitus with other specified complication: Secondary | ICD-10-CM | POA: Diagnosis not present

## 2018-02-26 DIAGNOSIS — E119 Type 2 diabetes mellitus without complications: Secondary | ICD-10-CM | POA: Diagnosis not present

## 2018-02-26 DIAGNOSIS — M5136 Other intervertebral disc degeneration, lumbar region: Secondary | ICD-10-CM | POA: Diagnosis not present

## 2018-02-26 LAB — BASIC METABOLIC PANEL
ANION GAP: 11 (ref 5–15)
BUN: 15 mg/dL (ref 6–20)
CALCIUM: 8.9 mg/dL (ref 8.9–10.3)
CO2: 28 mmol/L (ref 22–32)
Chloride: 99 mmol/L (ref 98–111)
Creatinine, Ser: 0.56 mg/dL (ref 0.44–1.00)
Glucose, Bld: 134 mg/dL — ABNORMAL HIGH (ref 70–99)
Potassium: 3.7 mmol/L (ref 3.5–5.1)
SODIUM: 138 mmol/L (ref 135–145)

## 2018-02-26 LAB — CBC WITH DIFFERENTIAL/PLATELET
BASOS ABS: 0 10*3/uL (ref 0–0.1)
Basophils Relative: 0 %
EOS ABS: 0 10*3/uL (ref 0–0.7)
Eosinophils Relative: 0 %
HCT: 36.8 % (ref 35.0–47.0)
Hemoglobin: 12.1 g/dL (ref 12.0–16.0)
Lymphocytes Relative: 24 %
Lymphs Abs: 3 10*3/uL (ref 1.0–3.6)
MCH: 27 pg (ref 26.0–34.0)
MCHC: 33 g/dL (ref 32.0–36.0)
MCV: 81.9 fL (ref 80.0–100.0)
Monocytes Absolute: 1.1 10*3/uL — ABNORMAL HIGH (ref 0.2–0.9)
Monocytes Relative: 9 %
NEUTROS ABS: 8.6 10*3/uL — AB (ref 1.4–6.5)
NEUTROS PCT: 67 %
Platelets: 187 10*3/uL (ref 150–440)
RBC: 4.49 MIL/uL (ref 3.80–5.20)
RDW: 17.4 % — ABNORMAL HIGH (ref 11.5–14.5)
WBC: 12.7 10*3/uL — AB (ref 3.6–11.0)

## 2018-02-26 LAB — VANCOMYCIN, TROUGH: Vancomycin Tr: 10 ug/mL — ABNORMAL LOW (ref 15–20)

## 2018-02-27 DIAGNOSIS — M4646 Discitis, unspecified, lumbar region: Secondary | ICD-10-CM | POA: Diagnosis not present

## 2018-02-27 DIAGNOSIS — N340 Urethral abscess: Secondary | ICD-10-CM | POA: Diagnosis not present

## 2018-02-27 DIAGNOSIS — E119 Type 2 diabetes mellitus without complications: Secondary | ICD-10-CM | POA: Diagnosis not present

## 2018-02-28 DIAGNOSIS — G8929 Other chronic pain: Secondary | ICD-10-CM | POA: Diagnosis not present

## 2018-02-28 DIAGNOSIS — E1169 Type 2 diabetes mellitus with other specified complication: Secondary | ICD-10-CM | POA: Diagnosis not present

## 2018-02-28 DIAGNOSIS — Z792 Long term (current) use of antibiotics: Secondary | ICD-10-CM | POA: Diagnosis not present

## 2018-02-28 DIAGNOSIS — E119 Type 2 diabetes mellitus without complications: Secondary | ICD-10-CM | POA: Diagnosis not present

## 2018-02-28 DIAGNOSIS — Z452 Encounter for adjustment and management of vascular access device: Secondary | ICD-10-CM | POA: Diagnosis not present

## 2018-02-28 DIAGNOSIS — M4646 Discitis, unspecified, lumbar region: Secondary | ICD-10-CM | POA: Diagnosis not present

## 2018-02-28 DIAGNOSIS — N340 Urethral abscess: Secondary | ICD-10-CM | POA: Diagnosis not present

## 2018-02-28 DIAGNOSIS — Z5181 Encounter for therapeutic drug level monitoring: Secondary | ICD-10-CM | POA: Diagnosis not present

## 2018-02-28 DIAGNOSIS — A409 Streptococcal sepsis, unspecified: Secondary | ICD-10-CM | POA: Diagnosis not present

## 2018-02-28 DIAGNOSIS — M5136 Other intervertebral disc degeneration, lumbar region: Secondary | ICD-10-CM | POA: Diagnosis not present

## 2018-02-28 DIAGNOSIS — M4626 Osteomyelitis of vertebra, lumbar region: Secondary | ICD-10-CM | POA: Diagnosis not present

## 2018-02-28 DIAGNOSIS — R197 Diarrhea, unspecified: Secondary | ICD-10-CM | POA: Diagnosis not present

## 2018-02-28 DIAGNOSIS — E785 Hyperlipidemia, unspecified: Secondary | ICD-10-CM | POA: Diagnosis not present

## 2018-02-28 DIAGNOSIS — I10 Essential (primary) hypertension: Secondary | ICD-10-CM | POA: Diagnosis not present

## 2018-02-28 DIAGNOSIS — D649 Anemia, unspecified: Secondary | ICD-10-CM | POA: Diagnosis not present

## 2018-02-28 DIAGNOSIS — D696 Thrombocytopenia, unspecified: Secondary | ICD-10-CM | POA: Diagnosis not present

## 2018-03-01 ENCOUNTER — Encounter: Payer: Self-pay | Admitting: Internal Medicine

## 2018-03-01 ENCOUNTER — Ambulatory Visit (INDEPENDENT_AMBULATORY_CARE_PROVIDER_SITE_OTHER): Payer: Federal, State, Local not specified - PPO | Admitting: Internal Medicine

## 2018-03-01 ENCOUNTER — Other Ambulatory Visit: Payer: Self-pay

## 2018-03-01 DIAGNOSIS — T7840XA Allergy, unspecified, initial encounter: Secondary | ICD-10-CM

## 2018-03-01 DIAGNOSIS — E119 Type 2 diabetes mellitus without complications: Secondary | ICD-10-CM | POA: Diagnosis not present

## 2018-03-01 DIAGNOSIS — N340 Urethral abscess: Secondary | ICD-10-CM | POA: Diagnosis not present

## 2018-03-01 DIAGNOSIS — M4646 Discitis, unspecified, lumbar region: Secondary | ICD-10-CM

## 2018-03-01 NOTE — Assessment & Plan Note (Signed)
Probable ampicillin, though also could be from fluconazole.  On vancomycin now and will continue through August 2nd.

## 2018-03-01 NOTE — Assessment & Plan Note (Signed)
On vancomycin and will continue through August 2nd.  If inflammatory markers remain elevated, will consider continuation with oral amoxicillin

## 2018-03-01 NOTE — Progress Notes (Signed)
   Subjective:    Patient ID: [name], female    DOB: [birth year], 52 y.o.   MRN: [id number]  HPI Here for hsfu.  She went to the [facility] ED with complaint of rash.  Had diffuse maculopapular rash and significant itching. She was on ampicillin and also took one dose of fluconazole.  Taking a prednisone taper.  Changed to vancomycin with concern for ampicillin rash and now listed.  Tolerating vancomycin well.  Improving.  Still a lot of itching.    Review of Systems  Constitutional: Negative for fatigue.  Gastrointestinal:       Some loose stools yesterday, none today  Skin: Positive for rash.  Neurological: Negative for dizziness.       Objective:   Physical Exam  Constitutional: She appears well-developed and well-nourished. No distress.  Eyes: No scleral icterus.  Cardiovascular: Normal rate, regular rhythm and normal heart sounds.  No murmur heard. Pulmonary/Chest: Effort normal and breath sounds normal. No respiratory distress.  Skin:  Mild rash picc c/d/i          Assessment & Plan:

## 2018-03-01 NOTE — Patient Instructions (Signed)
Try Benadryl for itch at night

## 2018-03-02 ENCOUNTER — Encounter: Payer: Self-pay | Admitting: Internal Medicine

## 2018-03-02 DIAGNOSIS — E119 Type 2 diabetes mellitus without complications: Secondary | ICD-10-CM | POA: Diagnosis not present

## 2018-03-02 DIAGNOSIS — A409 Streptococcal sepsis, unspecified: Secondary | ICD-10-CM | POA: Diagnosis not present

## 2018-03-02 DIAGNOSIS — Z452 Encounter for adjustment and management of vascular access device: Secondary | ICD-10-CM | POA: Diagnosis not present

## 2018-03-02 DIAGNOSIS — E1169 Type 2 diabetes mellitus with other specified complication: Secondary | ICD-10-CM | POA: Diagnosis not present

## 2018-03-02 DIAGNOSIS — M4626 Osteomyelitis of vertebra, lumbar region: Secondary | ICD-10-CM | POA: Diagnosis not present

## 2018-03-02 DIAGNOSIS — M4646 Discitis, unspecified, lumbar region: Secondary | ICD-10-CM | POA: Diagnosis not present

## 2018-03-02 DIAGNOSIS — M5136 Other intervertebral disc degeneration, lumbar region: Secondary | ICD-10-CM | POA: Diagnosis not present

## 2018-03-02 DIAGNOSIS — N340 Urethral abscess: Secondary | ICD-10-CM | POA: Diagnosis not present

## 2018-03-03 ENCOUNTER — Other Ambulatory Visit: Payer: Self-pay | Admitting: Pharmacist

## 2018-03-03 DIAGNOSIS — N340 Urethral abscess: Secondary | ICD-10-CM | POA: Diagnosis not present

## 2018-03-03 DIAGNOSIS — M4646 Discitis, unspecified, lumbar region: Secondary | ICD-10-CM | POA: Diagnosis not present

## 2018-03-03 DIAGNOSIS — E119 Type 2 diabetes mellitus without complications: Secondary | ICD-10-CM | POA: Diagnosis not present

## 2018-03-03 NOTE — Progress Notes (Signed)
[facility] pharmacy lab review  

## 2018-03-04 DIAGNOSIS — M4646 Discitis, unspecified, lumbar region: Secondary | ICD-10-CM | POA: Diagnosis not present

## 2018-03-04 DIAGNOSIS — N340 Urethral abscess: Secondary | ICD-10-CM | POA: Diagnosis not present

## 2018-03-04 DIAGNOSIS — E119 Type 2 diabetes mellitus without complications: Secondary | ICD-10-CM | POA: Diagnosis not present

## 2018-03-05 DIAGNOSIS — M4646 Discitis, unspecified, lumbar region: Secondary | ICD-10-CM | POA: Diagnosis not present

## 2018-03-05 DIAGNOSIS — N340 Urethral abscess: Secondary | ICD-10-CM | POA: Diagnosis not present

## 2018-03-05 DIAGNOSIS — E119 Type 2 diabetes mellitus without complications: Secondary | ICD-10-CM | POA: Diagnosis not present

## 2018-03-06 ENCOUNTER — Encounter: Payer: Self-pay | Admitting: Internal Medicine

## 2018-03-06 DIAGNOSIS — M4626 Osteomyelitis of vertebra, lumbar region: Secondary | ICD-10-CM | POA: Diagnosis not present

## 2018-03-06 DIAGNOSIS — Z452 Encounter for adjustment and management of vascular access device: Secondary | ICD-10-CM | POA: Diagnosis not present

## 2018-03-06 DIAGNOSIS — M4646 Discitis, unspecified, lumbar region: Secondary | ICD-10-CM | POA: Diagnosis not present

## 2018-03-06 DIAGNOSIS — N340 Urethral abscess: Secondary | ICD-10-CM | POA: Diagnosis not present

## 2018-03-06 DIAGNOSIS — A409 Streptococcal sepsis, unspecified: Secondary | ICD-10-CM | POA: Diagnosis not present

## 2018-03-06 DIAGNOSIS — E1169 Type 2 diabetes mellitus with other specified complication: Secondary | ICD-10-CM | POA: Diagnosis not present

## 2018-03-06 DIAGNOSIS — E119 Type 2 diabetes mellitus without complications: Secondary | ICD-10-CM | POA: Diagnosis not present

## 2018-03-06 DIAGNOSIS — M5136 Other intervertebral disc degeneration, lumbar region: Secondary | ICD-10-CM | POA: Diagnosis not present

## 2018-03-07 DIAGNOSIS — E119 Type 2 diabetes mellitus without complications: Secondary | ICD-10-CM | POA: Diagnosis not present

## 2018-03-07 DIAGNOSIS — N340 Urethral abscess: Secondary | ICD-10-CM | POA: Diagnosis not present

## 2018-03-07 DIAGNOSIS — M4646 Discitis, unspecified, lumbar region: Secondary | ICD-10-CM | POA: Diagnosis not present

## 2018-03-08 DIAGNOSIS — M4646 Discitis, unspecified, lumbar region: Secondary | ICD-10-CM | POA: Diagnosis not present

## 2018-03-08 DIAGNOSIS — E119 Type 2 diabetes mellitus without complications: Secondary | ICD-10-CM | POA: Diagnosis not present

## 2018-03-08 DIAGNOSIS — N340 Urethral abscess: Secondary | ICD-10-CM | POA: Diagnosis not present

## 2018-03-09 ENCOUNTER — Encounter: Payer: Self-pay | Admitting: Internal Medicine

## 2018-03-09 DIAGNOSIS — E119 Type 2 diabetes mellitus without complications: Secondary | ICD-10-CM | POA: Diagnosis not present

## 2018-03-09 DIAGNOSIS — N340 Urethral abscess: Secondary | ICD-10-CM | POA: Diagnosis not present

## 2018-03-09 DIAGNOSIS — M4626 Osteomyelitis of vertebra, lumbar region: Secondary | ICD-10-CM | POA: Diagnosis not present

## 2018-03-09 DIAGNOSIS — M5136 Other intervertebral disc degeneration, lumbar region: Secondary | ICD-10-CM | POA: Diagnosis not present

## 2018-03-09 DIAGNOSIS — E1169 Type 2 diabetes mellitus with other specified complication: Secondary | ICD-10-CM | POA: Diagnosis not present

## 2018-03-09 DIAGNOSIS — Z452 Encounter for adjustment and management of vascular access device: Secondary | ICD-10-CM | POA: Diagnosis not present

## 2018-03-09 DIAGNOSIS — A409 Streptococcal sepsis, unspecified: Secondary | ICD-10-CM | POA: Diagnosis not present

## 2018-03-09 DIAGNOSIS — M4646 Discitis, unspecified, lumbar region: Secondary | ICD-10-CM | POA: Diagnosis not present

## 2018-03-10 ENCOUNTER — Other Ambulatory Visit: Payer: Self-pay | Admitting: Pharmacist

## 2018-03-10 DIAGNOSIS — N340 Urethral abscess: Secondary | ICD-10-CM | POA: Diagnosis not present

## 2018-03-10 DIAGNOSIS — E119 Type 2 diabetes mellitus without complications: Secondary | ICD-10-CM | POA: Diagnosis not present

## 2018-03-10 DIAGNOSIS — M4646 Discitis, unspecified, lumbar region: Secondary | ICD-10-CM | POA: Diagnosis not present

## 2018-03-10 NOTE — Progress Notes (Signed)
[facility] pharmacy lab review  

## 2018-03-11 DIAGNOSIS — N340 Urethral abscess: Secondary | ICD-10-CM | POA: Diagnosis not present

## 2018-03-11 DIAGNOSIS — M4646 Discitis, unspecified, lumbar region: Secondary | ICD-10-CM | POA: Diagnosis not present

## 2018-03-11 DIAGNOSIS — E119 Type 2 diabetes mellitus without complications: Secondary | ICD-10-CM | POA: Diagnosis not present

## 2018-03-12 DIAGNOSIS — N340 Urethral abscess: Secondary | ICD-10-CM | POA: Diagnosis not present

## 2018-03-12 DIAGNOSIS — E119 Type 2 diabetes mellitus without complications: Secondary | ICD-10-CM | POA: Diagnosis not present

## 2018-03-12 DIAGNOSIS — M4646 Discitis, unspecified, lumbar region: Secondary | ICD-10-CM | POA: Diagnosis not present

## 2018-03-13 ENCOUNTER — Encounter: Payer: Self-pay | Admitting: Internal Medicine

## 2018-03-13 DIAGNOSIS — E1169 Type 2 diabetes mellitus with other specified complication: Secondary | ICD-10-CM | POA: Diagnosis not present

## 2018-03-13 DIAGNOSIS — M5136 Other intervertebral disc degeneration, lumbar region: Secondary | ICD-10-CM | POA: Diagnosis not present

## 2018-03-13 DIAGNOSIS — N340 Urethral abscess: Secondary | ICD-10-CM | POA: Diagnosis not present

## 2018-03-13 DIAGNOSIS — Z452 Encounter for adjustment and management of vascular access device: Secondary | ICD-10-CM | POA: Diagnosis not present

## 2018-03-13 DIAGNOSIS — A409 Streptococcal sepsis, unspecified: Secondary | ICD-10-CM | POA: Diagnosis not present

## 2018-03-13 DIAGNOSIS — M4626 Osteomyelitis of vertebra, lumbar region: Secondary | ICD-10-CM | POA: Diagnosis not present

## 2018-03-13 DIAGNOSIS — E119 Type 2 diabetes mellitus without complications: Secondary | ICD-10-CM | POA: Diagnosis not present

## 2018-03-13 DIAGNOSIS — M4646 Discitis, unspecified, lumbar region: Secondary | ICD-10-CM | POA: Diagnosis not present

## 2018-03-14 DIAGNOSIS — Z5181 Encounter for therapeutic drug level monitoring: Secondary | ICD-10-CM | POA: Diagnosis not present

## 2018-03-14 DIAGNOSIS — A409 Streptococcal sepsis, unspecified: Secondary | ICD-10-CM | POA: Diagnosis not present

## 2018-03-14 DIAGNOSIS — I10 Essential (primary) hypertension: Secondary | ICD-10-CM | POA: Diagnosis not present

## 2018-03-14 DIAGNOSIS — M5136 Other intervertebral disc degeneration, lumbar region: Secondary | ICD-10-CM | POA: Diagnosis not present

## 2018-03-14 DIAGNOSIS — Z792 Long term (current) use of antibiotics: Secondary | ICD-10-CM | POA: Diagnosis not present

## 2018-03-14 DIAGNOSIS — Z452 Encounter for adjustment and management of vascular access device: Secondary | ICD-10-CM | POA: Diagnosis not present

## 2018-03-14 DIAGNOSIS — G8929 Other chronic pain: Secondary | ICD-10-CM | POA: Diagnosis not present

## 2018-03-14 DIAGNOSIS — D649 Anemia, unspecified: Secondary | ICD-10-CM | POA: Diagnosis not present

## 2018-03-14 DIAGNOSIS — D696 Thrombocytopenia, unspecified: Secondary | ICD-10-CM | POA: Diagnosis not present

## 2018-03-14 DIAGNOSIS — M4646 Discitis, unspecified, lumbar region: Secondary | ICD-10-CM | POA: Diagnosis not present

## 2018-03-14 DIAGNOSIS — E1169 Type 2 diabetes mellitus with other specified complication: Secondary | ICD-10-CM | POA: Diagnosis not present

## 2018-03-14 DIAGNOSIS — M4626 Osteomyelitis of vertebra, lumbar region: Secondary | ICD-10-CM | POA: Diagnosis not present

## 2018-03-14 DIAGNOSIS — E119 Type 2 diabetes mellitus without complications: Secondary | ICD-10-CM | POA: Diagnosis not present

## 2018-03-14 DIAGNOSIS — N340 Urethral abscess: Secondary | ICD-10-CM | POA: Diagnosis not present

## 2018-03-14 DIAGNOSIS — E785 Hyperlipidemia, unspecified: Secondary | ICD-10-CM | POA: Diagnosis not present

## 2018-03-15 ENCOUNTER — Other Ambulatory Visit: Payer: Self-pay | Admitting: Pharmacist

## 2018-03-15 DIAGNOSIS — N340 Urethral abscess: Secondary | ICD-10-CM | POA: Diagnosis not present

## 2018-03-15 DIAGNOSIS — M4646 Discitis, unspecified, lumbar region: Secondary | ICD-10-CM | POA: Diagnosis not present

## 2018-03-15 DIAGNOSIS — E119 Type 2 diabetes mellitus without complications: Secondary | ICD-10-CM | POA: Diagnosis not present

## 2018-03-15 NOTE — Progress Notes (Signed)
[facility] pharmacy lab review  

## 2018-03-16 ENCOUNTER — Encounter: Payer: Self-pay | Admitting: Internal Medicine

## 2018-03-16 DIAGNOSIS — M5136 Other intervertebral disc degeneration, lumbar region: Secondary | ICD-10-CM | POA: Diagnosis not present

## 2018-03-16 DIAGNOSIS — M7610 Psoas tendinitis, unspecified hip: Secondary | ICD-10-CM | POA: Diagnosis not present

## 2018-03-16 DIAGNOSIS — M4626 Osteomyelitis of vertebra, lumbar region: Secondary | ICD-10-CM | POA: Diagnosis not present

## 2018-03-16 DIAGNOSIS — A409 Streptococcal sepsis, unspecified: Secondary | ICD-10-CM | POA: Diagnosis not present

## 2018-03-16 DIAGNOSIS — E119 Type 2 diabetes mellitus without complications: Secondary | ICD-10-CM | POA: Diagnosis not present

## 2018-03-16 DIAGNOSIS — M542 Cervicalgia: Secondary | ICD-10-CM | POA: Diagnosis not present

## 2018-03-16 DIAGNOSIS — M545 Low back pain: Secondary | ICD-10-CM | POA: Diagnosis not present

## 2018-03-16 DIAGNOSIS — Z452 Encounter for adjustment and management of vascular access device: Secondary | ICD-10-CM | POA: Diagnosis not present

## 2018-03-16 DIAGNOSIS — E1169 Type 2 diabetes mellitus with other specified complication: Secondary | ICD-10-CM | POA: Diagnosis not present

## 2018-03-16 DIAGNOSIS — N340 Urethral abscess: Secondary | ICD-10-CM | POA: Diagnosis not present

## 2018-03-16 DIAGNOSIS — M4646 Discitis, unspecified, lumbar region: Secondary | ICD-10-CM | POA: Diagnosis not present

## 2018-03-16 DIAGNOSIS — G890 Central pain syndrome: Secondary | ICD-10-CM | POA: Diagnosis not present

## 2018-03-17 DIAGNOSIS — M4646 Discitis, unspecified, lumbar region: Secondary | ICD-10-CM | POA: Diagnosis not present

## 2018-03-17 DIAGNOSIS — N340 Urethral abscess: Secondary | ICD-10-CM | POA: Diagnosis not present

## 2018-03-17 DIAGNOSIS — E119 Type 2 diabetes mellitus without complications: Secondary | ICD-10-CM | POA: Diagnosis not present

## 2018-03-19 DIAGNOSIS — M4646 Discitis, unspecified, lumbar region: Secondary | ICD-10-CM | POA: Diagnosis not present

## 2018-03-19 DIAGNOSIS — N340 Urethral abscess: Secondary | ICD-10-CM | POA: Diagnosis not present

## 2018-03-19 DIAGNOSIS — E119 Type 2 diabetes mellitus without complications: Secondary | ICD-10-CM | POA: Diagnosis not present

## 2018-03-20 ENCOUNTER — Encounter: Payer: Self-pay | Admitting: Internal Medicine

## 2018-03-20 DIAGNOSIS — M4646 Discitis, unspecified, lumbar region: Secondary | ICD-10-CM | POA: Diagnosis not present

## 2018-03-20 DIAGNOSIS — E119 Type 2 diabetes mellitus without complications: Secondary | ICD-10-CM | POA: Diagnosis not present

## 2018-03-20 DIAGNOSIS — M4626 Osteomyelitis of vertebra, lumbar region: Secondary | ICD-10-CM | POA: Diagnosis not present

## 2018-03-20 DIAGNOSIS — M5136 Other intervertebral disc degeneration, lumbar region: Secondary | ICD-10-CM | POA: Diagnosis not present

## 2018-03-20 DIAGNOSIS — A409 Streptococcal sepsis, unspecified: Secondary | ICD-10-CM | POA: Diagnosis not present

## 2018-03-20 DIAGNOSIS — E1169 Type 2 diabetes mellitus with other specified complication: Secondary | ICD-10-CM | POA: Diagnosis not present

## 2018-03-20 DIAGNOSIS — N340 Urethral abscess: Secondary | ICD-10-CM | POA: Diagnosis not present

## 2018-03-20 DIAGNOSIS — Z452 Encounter for adjustment and management of vascular access device: Secondary | ICD-10-CM | POA: Diagnosis not present

## 2018-03-21 DIAGNOSIS — K219 Gastro-esophageal reflux disease without esophagitis: Secondary | ICD-10-CM | POA: Diagnosis not present

## 2018-03-21 DIAGNOSIS — N340 Urethral abscess: Secondary | ICD-10-CM | POA: Diagnosis not present

## 2018-03-21 DIAGNOSIS — M4646 Discitis, unspecified, lumbar region: Secondary | ICD-10-CM | POA: Diagnosis not present

## 2018-03-21 DIAGNOSIS — E785 Hyperlipidemia, unspecified: Secondary | ICD-10-CM | POA: Diagnosis not present

## 2018-03-21 DIAGNOSIS — E119 Type 2 diabetes mellitus without complications: Secondary | ICD-10-CM | POA: Diagnosis not present

## 2018-03-21 DIAGNOSIS — I10 Essential (primary) hypertension: Secondary | ICD-10-CM | POA: Diagnosis not present

## 2018-03-22 DIAGNOSIS — N340 Urethral abscess: Secondary | ICD-10-CM | POA: Diagnosis not present

## 2018-03-22 DIAGNOSIS — E119 Type 2 diabetes mellitus without complications: Secondary | ICD-10-CM | POA: Diagnosis not present

## 2018-03-22 DIAGNOSIS — M4646 Discitis, unspecified, lumbar region: Secondary | ICD-10-CM | POA: Diagnosis not present

## 2018-03-24 ENCOUNTER — Ambulatory Visit (INDEPENDENT_AMBULATORY_CARE_PROVIDER_SITE_OTHER): Payer: Federal, State, Local not specified - PPO | Admitting: Internal Medicine

## 2018-03-24 ENCOUNTER — Encounter: Payer: Self-pay | Admitting: Internal Medicine

## 2018-03-24 VITALS — BP 134/89 | HR 80 | Temp 98.2°F | Ht 64.0 in | Wt 174.0 lb

## 2018-03-24 DIAGNOSIS — M4646 Discitis, unspecified, lumbar region: Secondary | ICD-10-CM

## 2018-03-24 DIAGNOSIS — Z5181 Encounter for therapeutic drug level monitoring: Secondary | ICD-10-CM | POA: Diagnosis not present

## 2018-03-24 DIAGNOSIS — Z452 Encounter for adjustment and management of vascular access device: Secondary | ICD-10-CM | POA: Diagnosis not present

## 2018-03-24 DIAGNOSIS — E1169 Type 2 diabetes mellitus with other specified complication: Secondary | ICD-10-CM | POA: Diagnosis not present

## 2018-03-24 DIAGNOSIS — N340 Urethral abscess: Secondary | ICD-10-CM | POA: Diagnosis not present

## 2018-03-24 DIAGNOSIS — T7840XA Allergy, unspecified, initial encounter: Secondary | ICD-10-CM

## 2018-03-24 DIAGNOSIS — D696 Thrombocytopenia, unspecified: Secondary | ICD-10-CM

## 2018-03-24 DIAGNOSIS — M5136 Other intervertebral disc degeneration, lumbar region: Secondary | ICD-10-CM | POA: Diagnosis not present

## 2018-03-24 DIAGNOSIS — A409 Streptococcal sepsis, unspecified: Secondary | ICD-10-CM | POA: Diagnosis not present

## 2018-03-24 DIAGNOSIS — E119 Type 2 diabetes mellitus without complications: Secondary | ICD-10-CM | POA: Diagnosis not present

## 2018-03-24 DIAGNOSIS — M4626 Osteomyelitis of vertebra, lumbar region: Secondary | ICD-10-CM | POA: Diagnosis not present

## 2018-03-24 MED ORDER — CEPHALEXIN 500 MG PO CAPS: 500.0000 mg | ORAL_CAPSULE | Freq: Four times a day (QID) | ORAL | 0 refills | Status: DC

## 2018-03-24 NOTE — Progress Notes (Signed)
   Subjective:    Patient ID: [name], female    DOB: November 30, 1965, 52 y.o.   MRN: [id number]  HPI Here for hsfu.  She had back pain with MRI concening for discitis and a positive blood culture with Strep viridans.  She started on ampicillin and developed a diffuse maculopapular rash and transitioned to vancomycin. Tolerating vancomycin well.  Still some back pain but overall good and walking more.  CRP down to just 15, ESR was not able to be done. No associated diarrhea.    Review of Systems  Constitutional: Negative for fatigue.  Gastrointestinal: Negative for diarrhea and nausea.  Skin: Negative for rash.  Neurological: Negative for dizziness.       Objective:   Physical Exam  Constitutional: She appears well-developed and well-nourished. No distress.  Eyes: No scleral icterus.  Cardiovascular: Normal rate, regular rhythm and normal heart sounds.  No murmur heard. Pulmonary/Chest: Effort normal and breath sounds normal. No respiratory distress.  Skin: No rash noted.   SH: no tobacco       Assessment & Plan:

## 2018-03-24 NOTE — Assessment & Plan Note (Signed)
Up to 148 on recent labs

## 2018-03-24 NOTE — Assessment & Plan Note (Signed)
Doing better and crp just minimally elevated.  I will transition her to oral keflex for 4 weeks and recheck in 3-4 weeks

## 2018-03-24 NOTE — Assessment & Plan Note (Signed)
Creat mildly elevated per patient on recent labs.  Will recheck bmp in 1 week off of vancomycin.

## 2018-03-24 NOTE — Assessment & Plan Note (Signed)
Resolved now.  I will use keflex though since it was a mild reaction, no hives and she knows to call if she again develops a rash and will stop keflex and not start anything else.

## 2018-03-30 ENCOUNTER — Other Ambulatory Visit: Payer: Federal, State, Local not specified - PPO

## 2018-03-30 DIAGNOSIS — M4646 Discitis, unspecified, lumbar region: Secondary | ICD-10-CM | POA: Diagnosis not present

## 2018-03-30 LAB — BASIC METABOLIC PANEL
BUN/Creatinine Ratio: 12 (calc) (ref 6–22)
BUN: 14 mg/dL (ref 7–25)
CALCIUM: 9.3 mg/dL (ref 8.6–10.4)
CHLORIDE: 100 mmol/L (ref 98–110)
CO2: 31 mmol/L (ref 20–32)
Creat: 1.13 mg/dL — ABNORMAL HIGH (ref 0.50–1.05)
GLUCOSE: 173 mg/dL — AB (ref 65–99)
Potassium: 4.8 mmol/L (ref 3.5–5.3)
Sodium: 137 mmol/L (ref 135–146)

## 2018-04-13 DIAGNOSIS — G8929 Other chronic pain: Secondary | ICD-10-CM | POA: Diagnosis not present

## 2018-04-13 DIAGNOSIS — M7610 Psoas tendinitis, unspecified hip: Secondary | ICD-10-CM | POA: Diagnosis not present

## 2018-04-13 DIAGNOSIS — F1120 Opioid dependence, uncomplicated: Secondary | ICD-10-CM | POA: Diagnosis not present

## 2018-04-13 DIAGNOSIS — M542 Cervicalgia: Secondary | ICD-10-CM | POA: Diagnosis not present

## 2018-04-13 DIAGNOSIS — M545 Low back pain: Secondary | ICD-10-CM | POA: Diagnosis not present

## 2018-04-13 DIAGNOSIS — Z79891 Long term (current) use of opiate analgesic: Secondary | ICD-10-CM | POA: Diagnosis not present

## 2018-04-13 DIAGNOSIS — G894 Chronic pain syndrome: Secondary | ICD-10-CM | POA: Diagnosis not present

## 2018-04-13 DIAGNOSIS — G890 Central pain syndrome: Secondary | ICD-10-CM | POA: Diagnosis not present

## 2018-04-19 ENCOUNTER — Ambulatory Visit (INDEPENDENT_AMBULATORY_CARE_PROVIDER_SITE_OTHER): Payer: Federal, State, Local not specified - PPO | Admitting: Internal Medicine

## 2018-04-19 ENCOUNTER — Encounter: Payer: Self-pay | Admitting: Internal Medicine

## 2018-04-19 VITALS — BP 122/81 | HR 76 | Temp 97.6°F | Ht 64.0 in | Wt 182.0 lb

## 2018-04-19 DIAGNOSIS — T7840XD Allergy, unspecified, subsequent encounter: Secondary | ICD-10-CM | POA: Diagnosis not present

## 2018-04-19 DIAGNOSIS — M4646 Discitis, unspecified, lumbar region: Secondary | ICD-10-CM

## 2018-04-19 DIAGNOSIS — Z5181 Encounter for therapeutic drug level monitoring: Secondary | ICD-10-CM | POA: Diagnosis not present

## 2018-04-19 NOTE — Assessment & Plan Note (Signed)
She has not had any issues with cephalosporin

## 2018-04-19 NOTE — Assessment & Plan Note (Signed)
Will check cmp on keflex

## 2018-04-19 NOTE — Progress Notes (Signed)
   Subjective:    Patient ID: [name], female    DOB: [birth year], 52 y.o.   MRN: [id number]  HPI Here for hsfu.  She had back pain with MRI concening for discitis and a positive blood culture with Strep viridans.  She started on ampicillin and developed a diffuse maculopapular rash and transitioned to vancomycin. Tolerating vancomycin well.  Still some back pain but overall good and walking more.  CRP down to just 15, ESR was not able to be done. Placed on keflex last visit for 4 weeks.  Tolerating well, no associated rash or diarrhea.  Continues to have pain but improved overall.    Review of Systems  Constitutional: Negative for fatigue.  Gastrointestinal: Negative for diarrhea and nausea.  Skin: Negative for rash.  Neurological: Negative for dizziness.       Objective:   Physical Exam  Constitutional: She appears well-developed and well-nourished. No distress.  Eyes: No scleral icterus.  Cardiovascular: Normal rate, regular rhythm and normal heart sounds.  No murmur heard. Pulmonary/Chest: Effort normal and breath sounds normal. No respiratory distress.  Skin: No rash noted.   SH: no tobacco       Assessment & Plan:

## 2018-04-19 NOTE — Assessment & Plan Note (Signed)
Clinical improvement.  I will check inflammatory markers now and if ok, she can complete her 4 weeks of Keflex and stop.  I will have her return in 2 months to monitor clinically

## 2018-04-20 ENCOUNTER — Telehealth: Payer: Self-pay

## 2018-04-20 LAB — COMPLETE METABOLIC PANEL WITH GFR
AG Ratio: 1 (calc) (ref 1.0–2.5)
ALT: 13 U/L (ref 6–29)
AST: 17 U/L (ref 10–35)
Albumin: 4 g/dL (ref 3.6–5.1)
Alkaline phosphatase (APISO): 82 U/L (ref 33–130)
BILIRUBIN TOTAL: 0.3 mg/dL (ref 0.2–1.2)
BUN / CREAT RATIO: 12 (calc) (ref 6–22)
BUN: 18 mg/dL (ref 7–25)
CO2: 28 mmol/L (ref 20–32)
Calcium: 9.5 mg/dL (ref 8.6–10.4)
Chloride: 102 mmol/L (ref 98–110)
Creat: 1.49 mg/dL — ABNORMAL HIGH (ref 0.50–1.05)
GFR, EST AFRICAN AMERICAN: 47 mL/min/{1.73_m2} — AB (ref >=60)
GFR, EST NON AFRICAN AMERICAN: 40 mL/min/{1.73_m2} — AB (ref >=60)
GLOBULIN: 3.9 g/dL — AB (ref 1.9–3.7)
Glucose, Bld: 169 mg/dL — ABNORMAL HIGH (ref 65–99)
Potassium: 4.4 mmol/L (ref 3.5–5.3)
SODIUM: 141 mmol/L (ref 135–146)
TOTAL PROTEIN: 7.9 g/dL (ref 6.1–8.1)

## 2018-04-20 LAB — SEDIMENTATION RATE: Sed Rate: 36 mm/h — ABNORMAL HIGH (ref 0–30)

## 2018-04-20 LAB — C-REACTIVE PROTEIN: CRP: 2.8 mg/L (ref <8.0)

## 2018-04-20 NOTE — Telephone Encounter (Signed)
-----   Message from Thayer Headings, MD sent at 04/20/2018  3:46 PM EDT ----- Please let her know her follow up labs look great and she can stop her antibiotics as we discussed when she runs out this week.  thanks

## 2018-04-20 NOTE — Telephone Encounter (Signed)
Patient made aware of great labs and to stop her antibiotics when she runs out this week.  Confirmed patient next follow up appointment in October.  Patient appreciative of call no concerns or questions voiced.  Shanaya Schneck S. LPN

## 2018-05-02 DIAGNOSIS — E119 Type 2 diabetes mellitus without complications: Secondary | ICD-10-CM | POA: Diagnosis not present

## 2018-05-02 DIAGNOSIS — E785 Hyperlipidemia, unspecified: Secondary | ICD-10-CM | POA: Diagnosis not present

## 2018-05-02 DIAGNOSIS — I10 Essential (primary) hypertension: Secondary | ICD-10-CM | POA: Diagnosis not present

## 2018-05-04 DIAGNOSIS — K080 Exfoliation of teeth due to systemic causes: Secondary | ICD-10-CM | POA: Diagnosis not present

## 2018-05-16 DIAGNOSIS — I10 Essential (primary) hypertension: Secondary | ICD-10-CM | POA: Diagnosis not present

## 2018-05-16 DIAGNOSIS — K219 Gastro-esophageal reflux disease without esophagitis: Secondary | ICD-10-CM | POA: Diagnosis not present

## 2018-05-16 DIAGNOSIS — E785 Hyperlipidemia, unspecified: Secondary | ICD-10-CM | POA: Diagnosis not present

## 2018-05-16 DIAGNOSIS — E119 Type 2 diabetes mellitus without complications: Secondary | ICD-10-CM | POA: Diagnosis not present

## 2018-06-19 ENCOUNTER — Ambulatory Visit (INDEPENDENT_AMBULATORY_CARE_PROVIDER_SITE_OTHER): Payer: Federal, State, Local not specified - PPO | Admitting: Internal Medicine

## 2018-06-19 ENCOUNTER — Encounter: Payer: Self-pay | Admitting: Internal Medicine

## 2018-06-19 VITALS — BP 143/83 | HR 55 | Temp 98.2°F | Wt 178.0 lb

## 2018-06-19 DIAGNOSIS — M4646 Discitis, unspecified, lumbar region: Secondary | ICD-10-CM | POA: Diagnosis not present

## 2018-06-19 DIAGNOSIS — B373 Candidiasis of vulva and vagina: Secondary | ICD-10-CM | POA: Diagnosis not present

## 2018-06-19 DIAGNOSIS — B3731 Acute candidiasis of vulva and vagina: Secondary | ICD-10-CM

## 2018-06-19 NOTE — Assessment & Plan Note (Signed)
Clinically appears resolved.  I will recheck inflammatory markers to be sure nothing too high.   rtc PRN unless concerns

## 2018-06-19 NOTE — Progress Notes (Signed)
   Subjective:    Patient ID: [name], female    DOB: July 01, 1966, 52 y.o.   MRN: [id number]  HPI Here for hsfu.  She had back pain with MRI concening for discitis and a positive blood culture with Strep viridans.  She started on ampicillin and developed a diffuse maculopapular rash and transitioned to vancomycin.  Placed on keflex for 4 weeks and completed that about 6-7 weeks ago. Follow up ESR, CRP reassuring.  Continues to walk more, less pain.    Review of Systems  Skin: Negative for rash.  Neurological: Negative for dizziness.       Objective:   Physical Exam  Constitutional: She appears well-developed and well-nourished. No distress.  Eyes: No scleral icterus.  Cardiovascular: Normal rate, regular rhythm and normal heart sounds.  No murmur heard. Pulmonary/Chest: Effort normal and breath sounds normal. No respiratory distress.  Skin: No rash noted.         Assessment & Plan:

## 2018-06-19 NOTE — Assessment & Plan Note (Signed)
This has resolved and did not have further episodes on Keflex.

## 2018-06-20 LAB — C-REACTIVE PROTEIN: CRP: 1.6 mg/L (ref <8.0)

## 2018-06-20 LAB — SEDIMENTATION RATE: SED RATE: 9 mm/h (ref 0–30)

## 2018-06-21 ENCOUNTER — Ambulatory Visit: Payer: Medicare Other | Admitting: Internal Medicine

## 2018-07-14 DIAGNOSIS — E782 Mixed hyperlipidemia: Secondary | ICD-10-CM | POA: Diagnosis not present

## 2018-07-14 DIAGNOSIS — I10 Essential (primary) hypertension: Secondary | ICD-10-CM | POA: Diagnosis not present

## 2018-07-14 DIAGNOSIS — E119 Type 2 diabetes mellitus without complications: Secondary | ICD-10-CM | POA: Diagnosis not present

## 2018-08-04 DIAGNOSIS — E119 Type 2 diabetes mellitus without complications: Secondary | ICD-10-CM | POA: Diagnosis not present

## 2018-08-04 DIAGNOSIS — I10 Essential (primary) hypertension: Secondary | ICD-10-CM | POA: Diagnosis not present

## 2018-08-04 DIAGNOSIS — E782 Mixed hyperlipidemia: Secondary | ICD-10-CM | POA: Diagnosis not present

## 2018-08-04 DIAGNOSIS — K219 Gastro-esophageal reflux disease without esophagitis: Secondary | ICD-10-CM | POA: Diagnosis not present

## 2018-10-26 DIAGNOSIS — E119 Type 2 diabetes mellitus without complications: Secondary | ICD-10-CM | POA: Diagnosis not present

## 2018-11-08 DIAGNOSIS — K080 Exfoliation of teeth due to systemic causes: Secondary | ICD-10-CM | POA: Diagnosis not present

## 2019-01-02 DIAGNOSIS — Z0000 Encounter for general adult medical examination without abnormal findings: Secondary | ICD-10-CM | POA: Diagnosis not present

## 2019-01-02 DIAGNOSIS — E782 Mixed hyperlipidemia: Secondary | ICD-10-CM | POA: Diagnosis not present

## 2019-01-02 DIAGNOSIS — Z1389 Encounter for screening for other disorder: Secondary | ICD-10-CM | POA: Diagnosis not present

## 2019-01-02 DIAGNOSIS — Z0110 Encounter for examination of ears and hearing without abnormal findings: Secondary | ICD-10-CM | POA: Diagnosis not present

## 2019-01-02 DIAGNOSIS — K219 Gastro-esophageal reflux disease without esophagitis: Secondary | ICD-10-CM | POA: Diagnosis not present

## 2019-01-02 DIAGNOSIS — E119 Type 2 diabetes mellitus without complications: Secondary | ICD-10-CM | POA: Diagnosis not present

## 2019-01-02 DIAGNOSIS — I10 Essential (primary) hypertension: Secondary | ICD-10-CM | POA: Diagnosis not present

## 2019-01-02 DIAGNOSIS — H9209 Otalgia, unspecified ear: Secondary | ICD-10-CM | POA: Diagnosis not present

## 2019-01-19 ENCOUNTER — Telehealth: Payer: Self-pay | Admitting: Hematology

## 2019-01-19 NOTE — Telephone Encounter (Signed)
Spoke with patient to confirm new patient appointment 6/12 appt at 12 pm

## 2019-01-29 ENCOUNTER — Other Ambulatory Visit: Payer: Self-pay | Admitting: Hematology

## 2019-01-29 DIAGNOSIS — D72819 Decreased white blood cell count, unspecified: Secondary | ICD-10-CM | POA: Insufficient documentation

## 2019-01-29 DIAGNOSIS — D696 Thrombocytopenia, unspecified: Secondary | ICD-10-CM

## 2019-01-29 NOTE — Progress Notes (Signed)
[name] CONSULT NOTE  Patient Care Team: Benito Mccreedy, MD as PCP - General (Internal Medicine) Nicanor Alcon, MD (Thoracic Surgery) Terance Ice, MD (Inactive) (Cardiology)  HEME/ONC OVERVIEW: 1. New onset leukopenia and thrombocytopenia -12/2018: labs showed WBC 2.8k w/ 32% PMN (ANC 900) and 58% lymph's; plts 86k; Hgb nl   PERTINENT NON-HEM/ONC PROBLEMS: 1. 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 She denies any symptoms, such as fever, chill, night sweats, weight change, chest pain, dyspnea, cough, or sputum production. No recent medication change. Review of the patient's labs from 2019 showed mild leukocytosis and normal plt count (patient was being related with discitis secondary to Strep viridans bacteremia with IV abx).  -I reviewed the lab results in detail with the patient -Clinically, patient denies any constitutional symptoms or symptoms of infection  -WBC 4.0k with ANC 1500 and mild lymphocyte predominance -I personally reviewed the peripheral blood smear, which showed normal neutrophils and some atypical appearing lymphocytes, which are non-specific; there was no circulating blasts  -Given the new onset leukopenia, I have ordered viral and infectious studies, including HIV, Hep B/C, B12, folate, and copper level -In light of the atypical lymphocytes on peripheral blood smear, I have ordered flow cytometry to rule out CLL at the next visit   Normocytic anemia -Hgb 10.8, new -Review of the patient's labs dating back to 2019 showed some intermittent normocytic anemia with Hgb fluctuating between 10 and 12 -Peripheral blood smear showed  some intermittent stomatocytes, suggesting possible liver disease  -I have ordered nutritional studies, including iron profile, B12, folate and copper -As the patient has not had colonoscopy in the past, and has fluctuating anemia, I have referred her to GI for further evaluation -Finally, as the patient has had some fluctuating renal function, I have ordered SPEP, IFE and free light chains to rule out any monoclonal protein   Thrombocytopenia -Plts 86k in 12/2018, new compared with 2019; labs in 2013 showed mild thrombocytopenia (plts ~80k) but there were no interval lab studies for comparison -Clinically, patient denies any abnormal bleeding or bruising -Plts 89k today, stable  -In addition to studies above, I have ordered coagulation studies and abdominal US to assess for any liver and spleen abnormalities  Orders Placed This Encounter  Procedures  . US Abdomen Complete    Standing Status:   Future    Standing Expiration Date:   02/02/2020    Order Specific Question:   Reason for Exam (SYMPTOM  OR DIAGNOSIS REQUIRED)    Answer:   Thrombocytopenia, rule out liver disease    Order Specific Question:   Preferred imaging location?    Answer:   [facility]  . CBC with Differential (Cancer Center Only)    Standing Status:   Future    Standing Expiration Date:   03/08/2020  . CMP ([facility] only)    Standing Status:   Future    Standing Expiration Date:   03/08/2020  . Save Smear ([facility])    Standing Status:   Future    Standing Expiration Date:   02/02/2020  . Lactate dehydrogenase    Standing Status:   Future    Standing Expiration Date:   03/08/2020  .  Flow Cytometry    Standing Status:   Future    Standing Expiration Date:   03/08/2020  . Multiple Myeloma Panel (SPEP&IFE w/QIG)    Standing Status:   Future    Standing Expiration Date:   03/08/2020  . Kappa/lambda light chains    Standing Status:   Future    Standing Expiration Date:   08/03/2020   All questions were  answered. The patient knows to call the clinic with any problems, questions or concerns.  Return in 1 month for labs and clinic follow-up.   [name], MD 02/02/2019 1:14 PM   CHIEF COMPLAINTS/PURPOSE OF CONSULTATION:  "I am told that my white blood cells are low"  HISTORY OF PRESENTING ILLNESS:  [name] 53 y.o. female is here because of new onset leukopenia and thrombocytopenia. Patient presented to her PCP in 12/2018 for routine annual physical, and her labs were notable for leukopenia (WBC 2.8k) and thrombocytopenia (plts 86k).  Patient reports that she has a chronic back pain for the past 4 to 5 years, localized to the low back, nonradiating, moderate intensity, for which she has been taking oxycodone prescribed by her PCP.  Over the past month, she also has had some intermittent right-sided flank pain, exacerbated by movement, prompting her PCP to order repeat blood work that showed leukopenia and thrombocytopenia.  Patient reports that she has occasional small bruises on her arm, but otherwise denies any significant excessive bruising or abnormal bleeding, such as hematochezia, melena, hematuria, hematemesis, hemoptysis.  She has some hot flashes, but denies any unexplained fever, night sweats, lymphadenopathy, chest pain, dyspnea, abdominal pain, nausea, vomiting, diarrhea.  MEDICAL HISTORY:  Past Medical History:  Diagnosis Date  . Arthritis 2008   Lumbar Deg Disc Disease  . Chest pain   . Diabetes mellitus without complication ([place])   . HTN (hypertension)   . Migraine   . 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 No pertinent surgical history.  SOCIAL HISTORY: Social History   Socioeconomic History  . Marital status: Married    Spouse name: Not on file  . Number of children: Not on file  . Years of education: Not on file  . Highest education level: Not on file  Occupational History  . Not on file  Social Needs  . Financial resource strain: Not  on file  . Food insecurity    Worry: Not on file    Inability: Not on file  . Transportation needs    Medical: Not on file    Non-medical: Not on file  Tobacco Use  . Smoking status: Never Smoker  . Smokeless tobacco: Never Used  Substance and Sexual Activity  . Alcohol use: No  . Drug use: No  . Sexual activity: Not on file  Lifestyle  . Physical activity    Days per week: Not on file    Minutes per session: Not on file  . Stress: Not on file  Relationships  . Social Herbalist on phone: Not on file    Gets together: Not on file    Attends religious service: Not on file    Active member of club or organization: Not on file    Attends meetings of clubs or organizations: Not on file    Relationship status: Not on file  . Intimate partner violence    Fear of current or ex partner: Not on file    Emotionally abused: Not on file  Physically abused: Not on file    Forced sexual activity: Not on file  Other Topics Concern  . Not on file  Social History Narrative  . Not on file    FAMILY HISTORY: Family History  Problem Relation Age of Onset  . Diabetes Mother   . Hypertension Mother     ALLERGIES:  is allergic to ampicillin.  MEDICATIONS:  Current Outpatient Medications  Medication Sig Dispense Refill  . amLODipine (NORVASC) 10 MG tablet Take 1 tablet (10 mg total) by mouth daily. 30 tablet 0  . aspirin 81 MG tablet Take 81 mg by mouth daily.      . Calcium Carbonate-Vitamin D (CALCIUM-VITAMIN D) 500-200 MG-UNIT per tablet Take 1 tablet by mouth 2 (two) times daily with a meal.      . cephALEXin (KEFLEX) 500 MG capsule Take 1 capsule (500 mg total) by mouth 4 (four) times daily. 120 capsule 0  . diclofenac sodium (VOLTAREN) 1 % GEL APPLY 4GM TO AFFECTED JOINTS EVERY 6 HOURS.    Marland Kitchen glimepiride (AMARYL) 2 MG tablet Take 2 mg by mouth daily.    . hydrocortisone cream 1 % Apply topically 4 (four) times daily. Apply to itchy areas 30 g 0  . loratadine  (CLARITIN) 10 MG tablet Take 1 tablet (10 mg total) by mouth daily. 14 tablet 0  . meloxicam (MOBIC) 15 MG tablet Take 15 mg by mouth daily.  0  . metFORMIN (GLUCOPHAGE) 500 MG tablet Take 500 mg by mouth daily.   1  . methocarbamol (ROBAXIN) 500 MG tablet Take 500 mg by mouth 2 (two) times daily.  0  . metoprolol succinate (TOPROL-XL) 25 MG 24 hr tablet Take 12.5 mg by mouth daily.  0  . NARCAN 4 MG/0.1ML LIQD nasal spray kit     . ondansetron (ZOFRAN-ODT) 4 MG disintegrating tablet Take 1 tablet (4 mg total) by mouth every 8 (eight) hours as needed for nausea or vomiting. 20 tablet 2  . oxyCODONE-acetaminophen (PERCOCET) 10-325 MG tablet Take 1 tablet by mouth 3 (three) times daily.  0  . predniSONE (DELTASONE) 10 MG tablet Take 4 pills daily for 3 days then 2 pills daily for 3 days then 1 pill daily for 3 days then stop 21 tablet 0  . pregabalin (LYRICA) 75 MG capsule TAKE 1 CAPSULE BY MOUTH EVERY 6 HOURS *FILL 60 DAYS FROM 3/25    . rosuvastatin (CRESTOR) 20 MG tablet Take 20 mg by mouth daily.  1  . Vitamin D, Ergocalciferol, (DRISDOL) 50000 units CAPS capsule TAKE 1 CAPSULE BY MOUTH TWICE A WEEK  3  . famotidine (PEPCID) 20 MG tablet Take 1 tablet (20 mg total) by mouth 2 (two) times daily for 7 days. 14 tablet 0   No current facility-administered medications for this visit.     REVIEW OF SYSTEMS:   Constitutional: ( - ) fevers, ( - )  chills , ( + ) hot flashes Eyes: ( - ) blurriness of vision, ( - ) double vision, ( - ) watery eyes Ears, nose, mouth, throat, and face: ( - ) mucositis, ( - ) sore throat Respiratory: ( - ) cough, ( - ) dyspnea, ( - ) wheezes Cardiovascular: ( - ) palpitation, ( - ) chest discomfort, ( - ) lower extremity swelling Gastrointestinal:  ( - ) nausea, ( - ) heartburn, ( - ) change in bowel habits Skin: ( + ) abnormal skin rashes Lymphatics: ( - ) new lymphadenopathy, ( - )  easy bruising Neurological: ( - ) numbness, ( - ) tingling, ( - ) new  weaknesses Behavioral/Psych: ( - ) mood change, ( - ) new changes  All other systems were reviewed with the patient and are negative.  PHYSICAL EXAMINATION: ECOG PERFORMANCE STATUS: 2 - Symptomatic, <50% confined to bed  Vitals:   02/02/19 1227  BP: (!) 141/65  Pulse: (!) 58  Resp: 17  SpO2: 100%   Filed Weights   02/02/19 1227  Weight: 170 lb 12.8 oz (77.5 kg)    GENERAL: alert, no distress and comfortable SKIN: one small bruise over the right upper arm; no other rashes or significant lesions EYES: conjunctiva are pink and non-injected, sclera clear OROPHARYNX: no exudate, no erythema; lips, buccal mucosa, and tongue normal  NECK: supple, non-tender LUNGS: clear to auscultation with normal breathing effort HEART: regular rate & rhythm, no murmurs, no lower extremity edema ABDOMEN: soft, non-tender, non-distended, normal bowel sounds Musculoskeletal: no cyanosis of digits and no clubbing  PSYCH: alert & oriented x 3, fluent speech NEURO: no focal motor/sensory deficits  LABORATORY DATA:  I have reviewed the data as listed Lab Results  Component Value Date   WBC 4.0 02/02/2019   HGB 10.8 (L) 02/02/2019   HCT 32.7 (L) 02/02/2019   MCV 87.2 02/02/2019   PLT 89 (L) 02/02/2019   Lab Results  Component Value Date   NA 139 02/02/2019   K 3.4 (L) 02/02/2019   CL 104 02/02/2019   CO2 30 02/02/2019   PATHOLOGY: I personally reviewed the patient's peripheral blood smear today.  The red blood cells were of normal size with some intermittent stomatocytes.  There was no schistocytosis.  The neutrophils were of normal morphology and there were some scattered atypical lymphocytes. There were no peripheral circulating blasts.  There were some immature and giant platelets.  I verified that there was no platelet clumping.

## 2019-02-02 ENCOUNTER — Inpatient Hospital Stay: Payer: Federal, State, Local not specified - PPO | Attending: Hematology | Admitting: Hematology

## 2019-02-02 ENCOUNTER — Encounter: Payer: Self-pay | Admitting: Hematology

## 2019-02-02 ENCOUNTER — Inpatient Hospital Stay: Payer: Federal, State, Local not specified - PPO

## 2019-02-02 ENCOUNTER — Other Ambulatory Visit: Payer: Self-pay

## 2019-02-02 ENCOUNTER — Telehealth: Payer: Self-pay | Admitting: *Deleted

## 2019-02-02 VITALS — BP 141/65 | HR 58 | Resp 17 | Ht 64.0 in | Wt 170.8 lb

## 2019-02-02 DIAGNOSIS — D649 Anemia, unspecified: Secondary | ICD-10-CM | POA: Insufficient documentation

## 2019-02-02 DIAGNOSIS — D696 Thrombocytopenia, unspecified: Secondary | ICD-10-CM | POA: Diagnosis not present

## 2019-02-02 DIAGNOSIS — D72819 Decreased white blood cell count, unspecified: Secondary | ICD-10-CM

## 2019-02-02 DIAGNOSIS — D72829 Elevated white blood cell count, unspecified: Secondary | ICD-10-CM | POA: Insufficient documentation

## 2019-02-02 DIAGNOSIS — Z79899 Other long term (current) drug therapy: Secondary | ICD-10-CM | POA: Diagnosis not present

## 2019-02-02 DIAGNOSIS — R7881 Bacteremia: Secondary | ICD-10-CM | POA: Insufficient documentation

## 2019-02-02 LAB — CMP (CANCER CENTER ONLY)
ALT: 11 U/L (ref 0–44)
AST: 17 U/L (ref 15–41)
Albumin: 4.7 g/dL (ref 3.5–5.0)
Alkaline Phosphatase: 70 U/L (ref 38–126)
Anion gap: 5 (ref 5–15)
BUN: 13 mg/dL (ref 6–20)
CO2: 30 mmol/L (ref 22–32)
Calcium: 9.8 mg/dL (ref 8.9–10.3)
Chloride: 104 mmol/L (ref 98–111)
Creatinine: 0.94 mg/dL (ref 0.44–1.00)
GFR, Est AFR Am: >60 mL/min (ref >60)
GFR, Estimated: >60 mL/min (ref >60)
Glucose, Bld: 77 mg/dL (ref 70–99)
Potassium: 3.4 mmol/L — ABNORMAL LOW (ref 3.5–5.1)
Sodium: 139 mmol/L (ref 135–145)
Total Bilirubin: 0.5 mg/dL (ref 0.3–1.2)
Total Protein: 7.9 g/dL (ref 6.5–8.1)

## 2019-02-02 LAB — PROTIME-INR
INR: 1.1 (ref 0.8–1.2)
Prothrombin Time: 13.8 seconds (ref 11.4–15.2)

## 2019-02-02 LAB — CBC WITH DIFFERENTIAL (CANCER CENTER ONLY)
Abs Immature Granulocytes: 0.01 10*3/uL (ref 0.00–0.07)
Basophils Absolute: 0 10*3/uL (ref 0.0–0.1)
Basophils Relative: 1 %
Eosinophils Absolute: 0.1 10*3/uL (ref 0.0–0.5)
Eosinophils Relative: 1 %
HCT: 32.7 % — ABNORMAL LOW (ref 36.0–46.0)
Hemoglobin: 10.8 g/dL — ABNORMAL LOW (ref 12.0–15.0)
Immature Granulocytes: 0 %
Lymphocytes Relative: 53 %
Lymphs Abs: 2.2 10*3/uL (ref 0.7–4.0)
MCH: 28.8 pg (ref 26.0–34.0)
MCHC: 33 g/dL (ref 30.0–36.0)
MCV: 87.2 fL (ref 80.0–100.0)
Monocytes Absolute: 0.3 10*3/uL (ref 0.1–1.0)
Monocytes Relative: 7 %
Neutro Abs: 1.5 10*3/uL — ABNORMAL LOW (ref 1.7–7.7)
Neutrophils Relative %: 38 %
Platelet Count: 89 10*3/uL — ABNORMAL LOW (ref 150–400)
RBC: 3.75 MIL/uL — ABNORMAL LOW (ref 3.87–5.11)
RDW: 13.2 % (ref 11.5–15.5)
WBC Count: 4 10*3/uL (ref 4.0–10.5)
nRBC: 0 % (ref 0.0–0.2)

## 2019-02-02 LAB — VITAMIN B12: Vitamin B-12: 1254 pg/mL — ABNORMAL HIGH (ref 180–914)

## 2019-02-02 LAB — SAVE SMEAR (SSMR)

## 2019-02-02 LAB — APTT: aPTT: 30 seconds (ref 24–36)

## 2019-02-02 LAB — FOLATE: Folate: 44.5 ng/mL (ref >5.9)

## 2019-02-02 NOTE — Telephone Encounter (Signed)
Called pt, gave results and instructions.  Pt verbalized understanding

## 2019-02-02 NOTE — Telephone Encounter (Signed)
-----   Message from [name], MD sent at 02/02/2019  1:20 PM EDT ----- Regarding: Lab results. Hi Mary,  Can you let Ms. Volcy know that her Hgb was a little low and her platelet count was also low but stable around 89k?  I have ordered abdominal US and also referred her to GI for colonoscopy to rule out GI bleeding.  I have sent a message to Kenney Houseman to bring her back in 1 month for labs and clinic follow-up.  Thanks.  [place]  ----- Message ----- From: Interface, Lab In [place] Sent: 02/02/2019   1:05 PM EDT To: [name], MD

## 2019-02-03 LAB — HIV ANTIBODY (ROUTINE TESTING W REFLEX): HIV Screen 4th Generation wRfx: NONREACTIVE

## 2019-02-03 LAB — HCV COMMENT:

## 2019-02-03 LAB — HEPATITIS B SURFACE ANTIGEN: Hepatitis B Surface Ag: NEGATIVE

## 2019-02-03 LAB — HEPATITIS C ANTIBODY (REFLEX): HCV Ab: <0.1 s/co ratio (ref 0.0–0.9)

## 2019-02-03 LAB — HEPATITIS B SURFACE ANTIBODY,QUALITATIVE: Hep B S Ab: REACTIVE

## 2019-02-03 LAB — HEPATITIS B CORE ANTIBODY, TOTAL: Hep B Core Total Ab: POSITIVE — AB

## 2019-02-05 ENCOUNTER — Telehealth: Payer: Self-pay | Admitting: Hematology

## 2019-02-05 LAB — IRON AND TIBC
Iron: 84 ug/dL (ref 41–142)
Saturation Ratios: 27 % (ref 21–57)
TIBC: 311 ug/dL (ref 236–444)
UIBC: 227 ug/dL (ref 120–384)

## 2019-02-05 LAB — LACTATE DEHYDROGENASE: LDH: 207 U/L — ABNORMAL HIGH (ref 98–192)

## 2019-02-05 LAB — FERRITIN: Ferritin: 45 ng/mL (ref 11–307)

## 2019-02-05 NOTE — Telephone Encounter (Signed)
Called and spoke with husband regarding appointments date/time. I advised that we would call him back once imaging appointments had been scheduled per 6/ los

## 2019-02-07 LAB — COPPER, SERUM: Copper: 76 ug/dL (ref 72–166)

## 2019-02-09 ENCOUNTER — Other Ambulatory Visit: Payer: Self-pay

## 2019-02-09 ENCOUNTER — Ambulatory Visit (HOSPITAL_BASED_OUTPATIENT_CLINIC_OR_DEPARTMENT_OTHER)
Admission: RE | Admit: 2019-02-09 | Discharge: 2019-02-09 | Disposition: A | Discharge status: Home / Self Care | Payer: Federal, State, Local not specified - PPO | Source: Ambulatory Visit | Attending: Hematology | Admitting: Hematology

## 2019-02-09 DIAGNOSIS — I10 Essential (primary) hypertension: Secondary | ICD-10-CM | POA: Diagnosis not present

## 2019-02-09 DIAGNOSIS — E119 Type 2 diabetes mellitus without complications: Secondary | ICD-10-CM | POA: Diagnosis not present

## 2019-02-09 DIAGNOSIS — D696 Thrombocytopenia, unspecified: Secondary | ICD-10-CM | POA: Diagnosis not present

## 2019-02-16 ENCOUNTER — Telehealth: Payer: Self-pay | Admitting: *Deleted

## 2019-02-16 ENCOUNTER — Encounter: Payer: Self-pay | Admitting: Gastroenterology

## 2019-02-16 NOTE — Telephone Encounter (Signed)
-----   Message from [name], MD sent at 02/14/2019  7:51 AM EDT ----- One last thing, can we let [name] know that her abdominal US was normal? I will see her back in a month for repeat labs. Thanks.  [place] ----- Message ----- From: Interface, Rad Results In Sent: 02/09/2019   2:47 PM EDT To: [name], MD

## 2019-02-16 NOTE — Telephone Encounter (Signed)
As noted below by Dr. Maylon Peppers, I informed her of ultrasound results. She verbalized understanding.

## 2019-02-20 DIAGNOSIS — K219 Gastro-esophageal reflux disease without esophagitis: Secondary | ICD-10-CM | POA: Diagnosis not present

## 2019-02-20 DIAGNOSIS — E119 Type 2 diabetes mellitus without complications: Secondary | ICD-10-CM | POA: Diagnosis not present

## 2019-02-20 DIAGNOSIS — I10 Essential (primary) hypertension: Secondary | ICD-10-CM | POA: Diagnosis not present

## 2019-02-20 DIAGNOSIS — E782 Mixed hyperlipidemia: Secondary | ICD-10-CM | POA: Diagnosis not present

## 2019-02-27 DIAGNOSIS — K219 Gastro-esophageal reflux disease without esophagitis: Secondary | ICD-10-CM | POA: Diagnosis not present

## 2019-02-27 DIAGNOSIS — K5903 Drug induced constipation: Secondary | ICD-10-CM | POA: Diagnosis not present

## 2019-02-27 DIAGNOSIS — Z1211 Encounter for screening for malignant neoplasm of colon: Secondary | ICD-10-CM | POA: Diagnosis not present

## 2019-03-01 ENCOUNTER — Encounter: Payer: Self-pay | Admitting: Gastroenterology

## 2019-03-01 ENCOUNTER — Telehealth (INDEPENDENT_AMBULATORY_CARE_PROVIDER_SITE_OTHER): Payer: Federal, State, Local not specified - PPO | Admitting: Gastroenterology

## 2019-03-01 ENCOUNTER — Other Ambulatory Visit: Payer: Self-pay

## 2019-03-01 VITALS — Ht 64.0 in | Wt 170.0 lb

## 2019-03-01 DIAGNOSIS — Z1211 Encounter for screening for malignant neoplasm of colon: Secondary | ICD-10-CM | POA: Diagnosis not present

## 2019-03-01 DIAGNOSIS — Z205 Contact with and (suspected) exposure to viral hepatitis: Secondary | ICD-10-CM | POA: Diagnosis not present

## 2019-03-01 DIAGNOSIS — D649 Anemia, unspecified: Secondary | ICD-10-CM

## 2019-03-01 DIAGNOSIS — A491 Streptococcal infection, unspecified site: Secondary | ICD-10-CM

## 2019-03-01 DIAGNOSIS — Z1231 Encounter for screening mammogram for malignant neoplasm of breast: Secondary | ICD-10-CM | POA: Diagnosis not present

## 2019-03-01 NOTE — Patient Instructions (Signed)
If you are age 53 or older, your body mass index should be between 23-30. Your Body mass index is 29.18 kg/m. If this is out of the aforementioned range listed, please consider follow up with your Primary Care Provider.  If you are age 48 or younger, your body mass index should be between 19-25. Your Body mass index is 29.18 kg/m. If this is out of the aformentioned range listed, please consider follow up with your Primary Care Provider.   Follow up as needed.   It was a pleasure to see you today!  Vito Cirigliano, D.O.

## 2019-03-01 NOTE — Progress Notes (Signed)
Chief Complaint: History of strep viridans, anemia, CRC screening, viral hepatitis exposure  Referring Provider:     [name], MD   HPI:    Due to current restrictions/limitations of in-office visits due to the COVID-19 pandemic, this scheduled clinical appointment was converted to a telehealth virtual consultation using [employer].  Interactive audio and video telecommunications were attempted between this provider and patient, however failed, due to patient having technical difficulties OR patient did not have access to video capability. We continued and completed visit with audio only.  -Time of medical discussion: 21 minutes -The patient did consent to this virtual visit and is aware of possible charges through their insurance for this visit.  -Names of all parties present: [name] (patient), Gerrit Heck, DO, [facility] (physician) -Patient location: Home -Physician location: Office  [name] is a 53 y.o. female referred to the Gastroenterology Clinic for evaluation of CRC screening, history of strep viridans, anemia evaluation.  Recently evaluated by Dr. Maylon Peppers in Hematology-Oncology for new onset leukopenia/mild neutropenia/thrombocytopenia.  Has had thrombocytopenia back in 2014 (78) and mildly in 2019 (122) but had since normalized in 02/2018, but most recently 62 in 01/2019.    Also has a history of discitis 2/2 strep viridans bacteremia in 01/2018.  No previous colonoscopy or EGD.    Additionally, noted to have mild normocytic anemia with hemoglobin 10.8, with prior studies 9-12 since 2014.  Peripheral smear notable for stomatocytes, suggesting possible liver disease.  Dr. Maylon Peppers ordered SPEP, IFE, HIV, hep B/C, B12, folate, copper, flow cytometry, coag panel, abdominal US.  Evaluation to date: - Abdominal ultrasound normal - Normal iron panel (although did have deficiency in 2019), mildly reduced ferritin at 45 -Normal coag panel, CMP -Prior cleared  exposure to hep B: HBcAb+, HBsAb+, HBsAg- -HCV, HIV negative -Normal folate, B12  She is otherwise without any complaints today.  She denies nausea, vomiting, diarrhea, constipation, hematochezia, melena, night sweats, fever, chills, weight loss, early satiety, dysphagia, odynophagia.  She was evaluated by Dr. Collene Mares (GI) earlier this week and is scheduled for colonoscopy with her on 7/29. Thinks possibly also scheduled for EGD at that time.   Past medical history, past surgical history, social history, family history, medications, and allergies reviewed in the chart and with patient.    Past Medical History:  Diagnosis Date  . Arthritis 2008   Lumbar Deg Disc Disease  . Chest pain   . Diabetes mellitus without complication ([place])   . HTN (hypertension)   . Migraine   . 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 No pertinent surgical history. Family History  Problem Relation Age of Onset  . Diabetes Mother   . Hypertension Mother   . Colon cancer Neg Hx    Social History   Tobacco Use  . Smoking status: Never Smoker  . Smokeless tobacco: Never Used  Substance Use Topics  . Alcohol use: No  . Drug use: No   Current Outpatient Medications  Medication Sig Dispense Refill  . amLODipine (NORVASC) 10 MG tablet Take 1 tablet (10 mg total) by mouth daily. 30 tablet 0  . aspirin 81 MG tablet Take 81 mg by mouth daily.      . Calcium Carbonate-Vitamin D (CALCIUM-VITAMIN D) 500-200 MG-UNIT per tablet Take 1 tablet by mouth 2 (two) times daily with a meal.      . cephALEXin (KEFLEX) 500 MG capsule Take 1 capsule (500  mg total) by mouth 4 (four) times daily. 120 capsule 0  . diclofenac sodium (VOLTAREN) 1 % GEL APPLY 4GM TO AFFECTED JOINTS EVERY 6 HOURS.    Marland Kitchen glimepiride (AMARYL) 2 MG tablet Take 2 mg by mouth daily.    . hydrocortisone cream 1 % Apply topically 4 (four) times daily. Apply to itchy areas 30 g 0  . loratadine (CLARITIN) 10 MG tablet Take 1 tablet (10 mg total) by mouth  daily. 14 tablet 0  . meloxicam (MOBIC) 15 MG tablet Take 15 mg by mouth daily.  0  . metFORMIN (GLUCOPHAGE) 500 MG tablet Take 500 mg by mouth daily.   1  . methocarbamol (ROBAXIN) 500 MG tablet Take 500 mg by mouth 2 (two) times daily.  0  . metoprolol succinate (TOPROL-XL) 25 MG 24 hr tablet Take 12.5 mg by mouth daily.  0  . NARCAN 4 MG/0.1ML LIQD nasal spray kit     . ondansetron (ZOFRAN-ODT) 4 MG disintegrating tablet Take 1 tablet (4 mg total) by mouth every 8 (eight) hours as needed for nausea or vomiting. 20 tablet 2  . oxyCODONE-acetaminophen (PERCOCET) 10-325 MG tablet Take 1 tablet by mouth 3 (three) times daily.  0  . predniSONE (DELTASONE) 10 MG tablet Take 4 pills daily for 3 days then 2 pills daily for 3 days then 1 pill daily for 3 days then stop 21 tablet 0  . pregabalin (LYRICA) 75 MG capsule TAKE 1 CAPSULE BY MOUTH EVERY 6 HOURS *FILL 60 DAYS FROM 3/25    . rosuvastatin (CRESTOR) 20 MG tablet Take 20 mg by mouth daily.  1  . Vitamin D, Ergocalciferol, (DRISDOL) 50000 units CAPS capsule TAKE 1 CAPSULE BY MOUTH TWICE A WEEK  3  . famotidine (PEPCID) 20 MG tablet Take 1 tablet (20 mg total) by mouth 2 (two) times daily for 7 days. 14 tablet 0   No current facility-administered medications for this visit.    Allergies  Allergen Reactions  . Ampicillin Hives and Itching    Has patient had a PCN reaction causing immediate rash, facial/tongue/throat swelling, SOB or lightheadedness with hypotension: Y Has patient had a PCN reaction causing severe rash involving mucus membranes or skin necrosis: Y Has patient had a PCN reaction that required hospitalization: Y Has patient had a PCN reaction occurring within the last 10 years: Y If all of the above answers are "NO", then may proceed with Cephalosporin use.      Review of Systems: All systems reviewed and negative except where noted in HPI.     Physical Exam:    Complete physical exam not completed due to the nature of  this telehealth communication.   Gen: Awake, alert, and oriented, and well communicative. Psych: Pleasant, cooperative, normal speech, thought processing seemingly intact   ASSESSMENT AND PLAN;   1) History of strep viridans -Recommend colonoscopy.  This is already scheduled with Dr. Collene Mares later this month.  2) CRC screening: -No prior routine CRC screening.  Plan for colonoscopy as above  3) Anemia: - Mild normocytic anemia, with mildly reduced ferritin but otherwise normal iron indices.  Given clinical history and lab results, recommended EGD with duodenal biopsies at time of colonoscopy.  She states she discussed the same with Dr. Collene Mares and that is the plan for later this month.  4) Exposure to Hepatitis B: - Prior exposure, with clearance/immunity.  No risk factors, and otherwise unsure of when/how she acquired.  Otherwise normal LAE's and normal-appearing liver on  recent ultrasound.  No further work-up needed at this time.  As she is already established care with Dr. Collene Mares and plan for EGD/colonoscopy later this month, I will defer to her for any ongoing work-up as planned.  I can remain available for questions as needed, but as she has already established care with an excellent Gastroenterologist.   Lavena Bullion, DO, [facility]  03/01/2019, 9:33 AM   [name], MD

## 2019-03-02 ENCOUNTER — Encounter: Payer: Self-pay | Admitting: Hematology

## 2019-03-02 ENCOUNTER — Inpatient Hospital Stay: Payer: Federal, State, Local not specified - PPO | Attending: Hematology

## 2019-03-02 ENCOUNTER — Inpatient Hospital Stay (HOSPITAL_BASED_OUTPATIENT_CLINIC_OR_DEPARTMENT_OTHER): Payer: Federal, State, Local not specified - PPO | Admitting: Hematology

## 2019-03-02 ENCOUNTER — Other Ambulatory Visit: Payer: Self-pay

## 2019-03-02 VITALS — BP 139/75 | HR 50 | Temp 98.4°F | Resp 18 | Ht 64.0 in | Wt 173.4 lb

## 2019-03-02 DIAGNOSIS — D649 Anemia, unspecified: Secondary | ICD-10-CM | POA: Insufficient documentation

## 2019-03-02 DIAGNOSIS — D72819 Decreased white blood cell count, unspecified: Secondary | ICD-10-CM

## 2019-03-02 DIAGNOSIS — D709 Neutropenia, unspecified: Secondary | ICD-10-CM | POA: Insufficient documentation

## 2019-03-02 DIAGNOSIS — D696 Thrombocytopenia, unspecified: Secondary | ICD-10-CM | POA: Diagnosis not present

## 2019-03-02 LAB — CMP (CANCER CENTER ONLY)
ALT: 12 U/L (ref 0–44)
AST: 18 U/L (ref 15–41)
Albumin: 4.2 g/dL (ref 3.5–5.0)
Alkaline Phosphatase: 71 U/L (ref 38–126)
Anion gap: 7 (ref 5–15)
BUN: 9 mg/dL (ref 6–20)
CO2: 28 mmol/L (ref 22–32)
Calcium: 8.2 mg/dL — ABNORMAL LOW (ref 8.9–10.3)
Chloride: 107 mmol/L (ref 98–111)
Creatinine: 0.89 mg/dL (ref 0.44–1.00)
GFR, Est AFR Am: >60 mL/min (ref >60)
GFR, Estimated: >60 mL/min (ref >60)
Glucose, Bld: 80 mg/dL (ref 70–99)
Potassium: 3.7 mmol/L (ref 3.5–5.1)
Sodium: 142 mmol/L (ref 135–145)
Total Bilirubin: 0.4 mg/dL (ref 0.3–1.2)
Total Protein: 7.3 g/dL (ref 6.5–8.1)

## 2019-03-02 LAB — CBC WITH DIFFERENTIAL (CANCER CENTER ONLY)
Abs Immature Granulocytes: 0.01 10*3/uL (ref 0.00–0.07)
Basophils Absolute: 0 10*3/uL (ref 0.0–0.1)
Basophils Relative: 1 %
Eosinophils Absolute: 0.1 10*3/uL (ref 0.0–0.5)
Eosinophils Relative: 2 %
HCT: 32 % — ABNORMAL LOW (ref 36.0–46.0)
Hemoglobin: 10.9 g/dL — ABNORMAL LOW (ref 12.0–15.0)
Immature Granulocytes: 0 %
Lymphocytes Relative: 63 %
Lymphs Abs: 1.8 10*3/uL (ref 0.7–4.0)
MCH: 29.5 pg (ref 26.0–34.0)
MCHC: 34.1 g/dL (ref 30.0–36.0)
MCV: 86.7 fL (ref 80.0–100.0)
Monocytes Absolute: 0.2 10*3/uL (ref 0.1–1.0)
Monocytes Relative: 7 %
Neutro Abs: 0.8 10*3/uL — ABNORMAL LOW (ref 1.7–7.7)
Neutrophils Relative %: 27 %
Platelet Count: 76 10*3/uL — ABNORMAL LOW (ref 150–400)
RBC: 3.69 MIL/uL — ABNORMAL LOW (ref 3.87–5.11)
RDW: 13.3 % (ref 11.5–15.5)
WBC Count: 2.8 10*3/uL — ABNORMAL LOW (ref 4.0–10.5)
nRBC: 0 % (ref 0.0–0.2)

## 2019-03-02 LAB — SAVE SMEAR(SSMR), FOR PROVIDER SLIDE REVIEW

## 2019-03-02 LAB — LACTATE DEHYDROGENASE: LDH: 215 U/L — ABNORMAL HIGH (ref 98–192)

## 2019-03-02 NOTE — Progress Notes (Signed)
[place] OFFICE PROGRESS NOTE  Patient Care Team: Benito Mccreedy, MD as PCP - General (Internal Medicine) Nicanor Alcon, MD (Thoracic Surgery) Terance Ice, MD (Inactive) (Cardiology)  HEME/ONC OVERVIEW: 1. New onset leukopenia and thrombocytopenia -12/2018: labs showed WBC 2.8k w/ 32% PMN (ANC 900) and 58% lymph's; plts 86k; Hgb nl   Nutritional and infectious studies unremarkable  Abdominal US negative for liver disease   PERTINENT NON-HEM/ONC PROBLEMS: 1. 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   ASSESSMENT & PLAN:   Leukopenia w/ neutropenia  -Infectious studies unremarkable, except positive Hep B surface Ab and core antibody total, suggesting previous hepatitis B infection -Clinically, patient denies any symptoms of infection -WBC 2.8k with ANC ~800, slightly lower than last visit  -Given the relative lymphocyte predominance, I have ordered flow cytometry to rule out monoclonal cells, such as CLL -In addition, I have also ordered SPEP, IFE, and free light chains to rule out any plasma cell dyscrasia -If the above work-up is unremarkable, we will likely need bone marrow bx to further evaluate any underlying bone marrow disorder, such as MDS  -I counseled patient on any abnormal symptoms of infection, including fever (temp > 100.4), for which she should seek further evaluation  Normocytic anemia -Nutritional studies, including B12, folate, iron and copper levels, were normal -Patient is scheduled for EGD and colonoscopy on 03/21/2019 at [facility]  -Myeloma panel pending as above -If no definite etiology is identified, we will pursue bone marrow bx as above  Thrombocytopenia -Infectious and coag studies were normal; abdominal US negative for any liver pathology -Clinically, patient denies any abnormal bleeding or bruising -Plts 76k today, slightly lower than the last visit  -See work-up as outlined above  -I counseled the patient  on any concerning symptoms, including abnormal bleeding or bruising, for which she should seek care promptly  Orders Placed This Encounter  Procedures  . CBC with Differential (Cancer Center Only)    Standing Status:   Future    Standing Expiration Date:   04/05/2020  . CMP ([place] only)    Standing Status:   Future    Standing Expiration Date:   04/05/2020  . Lactate dehydrogenase    Standing Status:   Future    Standing Expiration Date:   04/05/2020  . Save Smear (SSMR)    Standing Status:   Future    Standing Expiration Date:   03/01/2020   All questions were answered. The patient knows to call the clinic with any problems, questions or concerns. No barriers to learning was detected.  A total of more than 25 minutes were spent face-to-face with the patient during this encounter and over half of that time was spent on counseling and coordination of care as outlined above.   Return in 1 month for labs and clinic follow-up.  [name], MD 03/02/2019 11:15 AM  CHIEF COMPLAINT: "I am doing okay"  INTERVAL HISTORY: Ms. Tates returns to clinic for follow-up of pancytopenia.  Patient reports that she has been doing relatively well since last visit, and denies any new symptoms, such as fever, chills, night sweats, lymphadenopathy, chest pain, dyspnea, dumping, nausea, vomiting, diarrhea, or abnormal bleeding/bruising.  She reports that she has lost approximately 12 pounds over the past 3 months, despite having normal appetite and no change in her diet.  She denies any other complaint today.  REVIEW OF SYSTEMS:   Constitutional: ( - ) fevers, ( - )  chills , ( - )  night sweats Eyes: ( - ) blurriness of vision, ( - ) double vision, ( - ) watery eyes Ears, nose, mouth, throat, and face: ( - ) mucositis, ( - ) sore throat Respiratory: ( - ) cough, ( - ) dyspnea, ( - ) wheezes Cardiovascular: ( - ) palpitation, ( - ) chest discomfort, ( - ) lower extremity swelling Gastrointestinal:  (  - ) nausea, ( - ) heartburn, ( - ) change in bowel habits Skin: ( - ) abnormal skin rashes Lymphatics: ( - ) new lymphadenopathy, ( - ) easy bruising Neurological: ( - ) numbness, ( - ) tingling, ( - ) new weaknesses Behavioral/Psych: ( - ) mood change, ( - ) new changes  All other systems were reviewed with the patient and are negative.  I have reviewed the past medical history, past surgical history, social history and family history with the patient and they are unchanged from previous note.  ALLERGIES:  is allergic to ampicillin.  MEDICATIONS:  Current Outpatient Medications  Medication Sig Dispense Refill  . amLODipine (NORVASC) 10 MG tablet Take 1 tablet (10 mg total) by mouth daily. 30 tablet 0  . aspirin 81 MG tablet Take 81 mg by mouth daily.      . Calcium Carbonate-Vitamin D (CALCIUM-VITAMIN D) 500-200 MG-UNIT per tablet Take 1 tablet by mouth 2 (two) times daily with a meal.      . cephALEXin (KEFLEX) 500 MG capsule Take 1 capsule (500 mg total) by mouth 4 (four) times daily. 120 capsule 0  . diclofenac sodium (VOLTAREN) 1 % GEL APPLY 4GM TO AFFECTED JOINTS EVERY 6 HOURS.    Marland Kitchen glimepiride (AMARYL) 2 MG tablet Take 2 mg by mouth daily.    . hydrocortisone cream 1 % Apply topically 4 (four) times daily. Apply to itchy areas 30 g 0  . loratadine (CLARITIN) 10 MG tablet Take 1 tablet (10 mg total) by mouth daily. 14 tablet 0  . meloxicam (MOBIC) 15 MG tablet Take 15 mg by mouth daily.  0  . metFORMIN (GLUCOPHAGE) 500 MG tablet Take 500 mg by mouth daily.   1  . methocarbamol (ROBAXIN) 500 MG tablet Take 500 mg by mouth 2 (two) times daily.  0  . metoprolol succinate (TOPROL-XL) 25 MG 24 hr tablet Take 12.5 mg by mouth daily.  0  . oxyCODONE-acetaminophen (PERCOCET) 10-325 MG tablet Take 1 tablet by mouth 3 (three) times daily.  0  . pregabalin (LYRICA) 75 MG capsule TAKE 1 CAPSULE BY MOUTH EVERY 6 HOURS *FILL 60 DAYS FROM 3/25    . rosuvastatin (CRESTOR) 20 MG tablet Take 20 mg by  mouth daily.  1  . Vitamin D, Ergocalciferol, (DRISDOL) 50000 units CAPS capsule TAKE 1 CAPSULE BY MOUTH TWICE A WEEK  3  . famotidine (PEPCID) 20 MG tablet Take 1 tablet (20 mg total) by mouth 2 (two) times daily for 7 days. 14 tablet 0  . NARCAN 4 MG/0.1ML LIQD nasal spray kit     . ondansetron (ZOFRAN-ODT) 4 MG disintegrating tablet Take 1 tablet (4 mg total) by mouth every 8 (eight) hours as needed for nausea or vomiting. (Patient not taking: Reported on 03/02/2019) 20 tablet 2   No current facility-administered medications for this visit.     PHYSICAL EXAMINATION: ECOG PERFORMANCE STATUS: 1 - Symptomatic but completely ambulatory  Today's Vitals   03/02/19 0947  BP: 139/75  Pulse: (!) 50  Resp: 18  Temp: 98.4 F (36.9 C)  TempSrc: Oral  SpO2: 100%  Weight: 173 lb 6.4 oz (78.7 kg)  Height: _0  (1.626 m)  PainSc: 0-No pain   Body mass index is 29.76 kg/m.  Filed Weights   03/02/19 0947  Weight: 173 lb 6.4 oz (78.7 kg)    GENERAL: alert, no distress and comfortable SKIN: skin color, texture, turgor are normal, no rashes or significant lesions EYES: conjunctiva are pink and non-injected, sclera clear OROPHARYNX: no exudate, no erythema; lips, buccal mucosa, and tongue normal  NECK: supple, non-tender LUNGS: clear to auscultation with normal breathing effort HEART: regular rate & rhythm and no murmurs and no lower extremity edema ABDOMEN: soft, non-tender, non-distended, normal bowel sounds Musculoskeletal: no cyanosis of digits and no clubbing  PSYCH: alert & oriented x 3, fluent speech NEURO: no focal motor/sensory deficits  LABORATORY DATA:  I have reviewed the data as listed    Component Value Date/Time   NA 142 03/02/2019 0904   K 3.7 03/02/2019 0904   CL 107 03/02/2019 0904   CO2 28 03/02/2019 0904   GLUCOSE 80 03/02/2019 0904   BUN 9 03/02/2019 0904   CREATININE 0.89 03/02/2019 0904   CREATININE 1.49 (H) 04/19/2018 1652   CALCIUM 8.2 (L) 03/02/2019 0904    PROT 7.3 03/02/2019 0904   ALBUMIN 4.2 03/02/2019 0904   AST 18 03/02/2019 0904   ALT 12 03/02/2019 0904   ALKPHOS 71 03/02/2019 0904   BILITOT 0.4 03/02/2019 0904   GFRNONAA >60 03/02/2019 0904   GFRNONAA 40 (L) 04/19/2018 1652   GFRAA >60 03/02/2019 0904   GFRAA 47 (L) 04/19/2018 1652    No results found for: SPEP, UPEP  Lab Results  Component Value Date   WBC 2.8 (L) 03/02/2019   NEUTROABS 0.8 (L) 03/02/2019   HGB 10.9 (L) 03/02/2019   HCT 32.0 (L) 03/02/2019   MCV 86.7 03/02/2019   PLT 76 (L) 03/02/2019      Chemistry      Component Value Date/Time   NA 142 03/02/2019 0904   K 3.7 03/02/2019 0904   CL 107 03/02/2019 0904   CO2 28 03/02/2019 0904   BUN 9 03/02/2019 0904   CREATININE 0.89 03/02/2019 0904   CREATININE 1.49 (H) 04/19/2018 1652      Component Value Date/Time   CALCIUM 8.2 (L) 03/02/2019 0904   ALKPHOS 71 03/02/2019 0904   AST 18 03/02/2019 0904   ALT 12 03/02/2019 0904   BILITOT 0.4 03/02/2019 0904       RADIOGRAPHIC STUDIES: I have personally reviewed the radiological images as listed below and agreed with the findings in the report. US Abdomen Complete  Result Date: 02/09/2019 CLINICAL DATA:  Thrombocytopenia. Evaluate for liver disease. History of diabetes and hypertension. EXAM: ABDOMEN ULTRASOUND COMPLETE COMPARISON:  Limited correlation made with chest CT 06/27/2014. FINDINGS: Gallbladder: No gallstones or wall thickening visualized. No sonographic Murphy sign noted by sonographer. Common bile duct: Diameter: 4 mm Liver: No focal lesion identified. Within normal limits in parenchymal echogenicity. Portal vein is patent on color Doppler imaging with normal direction of blood flow towards the liver. IVC: No abnormality visualized. Pancreas: Visualized portion unremarkable. Spleen: Size and appearance within normal limits. Right Kidney: Length: 10.0 cm. Echogenicity within normal limits. No mass or hydronephrosis visualized. Left Kidney: Length:  10.4 cm. Echogenicity within normal limits. No mass or hydronephrosis visualized. Abdominal aorta: Mild atherosclerosis without evidence of aneurysm. Other findings: No evidence of ascites. IMPRESSION: Normal abdominal CT. No hepatic or biliary abnormalities identified. Electronically Signed  By: Richardean Sale M.D.   On: 02/09/2019 14:44

## 2019-03-05 LAB — KAPPA/LAMBDA LIGHT CHAINS
Kappa free light chain: 25.5 mg/L — ABNORMAL HIGH (ref 3.3–19.4)
Kappa, lambda light chain ratio: 1.55 (ref 0.26–1.65)
Lambda free light chains: 16.4 mg/L (ref 5.7–26.3)

## 2019-03-06 ENCOUNTER — Other Ambulatory Visit: Payer: Self-pay | Admitting: Hematology

## 2019-03-06 LAB — MULTIPLE MYELOMA PANEL, SERUM
Albumin SerPl Elph-Mcnc: 3.9 g/dL (ref 2.9–4.4)
Albumin/Glob SerPl: 1.3 (ref 0.7–1.7)
Alpha 1: 0.2 g/dL (ref 0.0–0.4)
Alpha2 Glob SerPl Elph-Mcnc: 0.6 g/dL (ref 0.4–1.0)
B-Globulin SerPl Elph-Mcnc: 0.8 g/dL (ref 0.7–1.3)
Gamma Glob SerPl Elph-Mcnc: 1.6 g/dL (ref 0.4–1.8)
Globulin, Total: 3.1 g/dL (ref 2.2–3.9)
IgA: 232 mg/dL (ref 87–352)
IgG (Immunoglobin G), Serum: 1683 mg/dL — ABNORMAL HIGH (ref 586–1602)
IgM (Immunoglobulin M), Srm: 123 mg/dL (ref 26–217)
Total Protein ELP: 7 g/dL (ref 6.0–8.5)

## 2019-03-07 ENCOUNTER — Telehealth: Payer: Self-pay | Admitting: *Deleted

## 2019-03-07 NOTE — Telephone Encounter (Signed)
As noted below by Dr. Maylon Peppers, I informed Mr. [name] about lab results. We will repeat labs in August, if those labs are still low, more than likely Dr. Maylon Peppers will need to do a bone marrow biopsy. He verbalized understanding. I told him if she has any questions or concerns please call the office.

## 2019-03-07 NOTE — Telephone Encounter (Signed)
-----   Message from [name], MD sent at 03/06/2019  1:56 PM EDT ----- Regarding: Labs [name],  Can we let [name] know that her labs didn't show any clear cause of why her blood counts are low. We will see her back in early 03/2019, and if her labs are still low then, we will likely need bone marrow bx.  Thanks.  [place]

## 2019-03-09 LAB — FLOW CYTOMETRY

## 2019-03-21 DIAGNOSIS — K219 Gastro-esophageal reflux disease without esophagitis: Secondary | ICD-10-CM | POA: Diagnosis not present

## 2019-03-21 DIAGNOSIS — K259 Gastric ulcer, unspecified as acute or chronic, without hemorrhage or perforation: Secondary | ICD-10-CM | POA: Diagnosis not present

## 2019-03-21 DIAGNOSIS — K254 Chronic or unspecified gastric ulcer with hemorrhage: Secondary | ICD-10-CM | POA: Diagnosis not present

## 2019-03-21 DIAGNOSIS — Z1211 Encounter for screening for malignant neoplasm of colon: Secondary | ICD-10-CM | POA: Diagnosis not present

## 2019-03-30 ENCOUNTER — Other Ambulatory Visit: Payer: Self-pay

## 2019-03-30 ENCOUNTER — Inpatient Hospital Stay: Payer: Federal, State, Local not specified - PPO

## 2019-03-30 ENCOUNTER — Encounter: Payer: Self-pay | Admitting: Hematology

## 2019-03-30 ENCOUNTER — Inpatient Hospital Stay: Payer: Federal, State, Local not specified - PPO | Attending: Hematology | Admitting: Hematology

## 2019-03-30 ENCOUNTER — Telehealth: Payer: Self-pay | Admitting: Hematology

## 2019-03-30 VITALS — BP 139/69 | HR 67 | Temp 97.3°F | Resp 17 | Wt 170.8 lb

## 2019-03-30 DIAGNOSIS — D696 Thrombocytopenia, unspecified: Secondary | ICD-10-CM | POA: Diagnosis not present

## 2019-03-30 DIAGNOSIS — Z79899 Other long term (current) drug therapy: Secondary | ICD-10-CM | POA: Diagnosis not present

## 2019-03-30 DIAGNOSIS — D61818 Other pancytopenia: Secondary | ICD-10-CM | POA: Insufficient documentation

## 2019-03-30 DIAGNOSIS — D72819 Decreased white blood cell count, unspecified: Secondary | ICD-10-CM

## 2019-03-30 DIAGNOSIS — Z7984 Long term (current) use of oral hypoglycemic drugs: Secondary | ICD-10-CM | POA: Diagnosis not present

## 2019-03-30 DIAGNOSIS — D649 Anemia, unspecified: Secondary | ICD-10-CM | POA: Diagnosis not present

## 2019-03-30 DIAGNOSIS — Z7982 Long term (current) use of aspirin: Secondary | ICD-10-CM | POA: Diagnosis not present

## 2019-03-30 DIAGNOSIS — Z791 Long term (current) use of non-steroidal anti-inflammatories (NSAID): Secondary | ICD-10-CM | POA: Insufficient documentation

## 2019-03-30 LAB — CMP (CANCER CENTER ONLY)
ALT: 12 U/L (ref 0–44)
AST: 17 U/L (ref 15–41)
Albumin: 4.2 g/dL (ref 3.5–5.0)
Alkaline Phosphatase: 71 U/L (ref 38–126)
Anion gap: 7 (ref 5–15)
BUN: 12 mg/dL (ref 6–20)
CO2: 30 mmol/L (ref 22–32)
Calcium: 9 mg/dL (ref 8.9–10.3)
Chloride: 105 mmol/L (ref 98–111)
Creatinine: 1 mg/dL (ref 0.44–1.00)
GFR, Est AFR Am: >60 mL/min (ref >60)
GFR, Estimated: >60 mL/min (ref >60)
Glucose, Bld: 68 mg/dL — ABNORMAL LOW (ref 70–99)
Potassium: 3.5 mmol/L (ref 3.5–5.1)
Sodium: 142 mmol/L (ref 135–145)
Total Bilirubin: 0.5 mg/dL (ref 0.3–1.2)
Total Protein: 7.4 g/dL (ref 6.5–8.1)

## 2019-03-30 LAB — CBC WITH DIFFERENTIAL (CANCER CENTER ONLY)
Abs Immature Granulocytes: 0 10*3/uL (ref 0.00–0.07)
Basophils Absolute: 0 10*3/uL (ref 0.0–0.1)
Basophils Relative: 1 %
Eosinophils Absolute: 0 10*3/uL (ref 0.0–0.5)
Eosinophils Relative: 1 %
HCT: 33.5 % — ABNORMAL LOW (ref 36.0–46.0)
Hemoglobin: 11.3 g/dL — ABNORMAL LOW (ref 12.0–15.0)
Immature Granulocytes: 0 %
Lymphocytes Relative: 59 %
Lymphs Abs: 2.2 10*3/uL (ref 0.7–4.0)
MCH: 29 pg (ref 26.0–34.0)
MCHC: 33.7 g/dL (ref 30.0–36.0)
MCV: 85.9 fL (ref 80.0–100.0)
Monocytes Absolute: 0.3 10*3/uL (ref 0.1–1.0)
Monocytes Relative: 9 %
Neutro Abs: 1.1 10*3/uL — ABNORMAL LOW (ref 1.7–7.7)
Neutrophils Relative %: 30 %
Platelet Count: 90 10*3/uL — ABNORMAL LOW (ref 150–400)
RBC: 3.9 MIL/uL (ref 3.87–5.11)
RDW: 13.1 % (ref 11.5–15.5)
WBC Count: 3.6 10*3/uL — ABNORMAL LOW (ref 4.0–10.5)
nRBC: 0 % (ref 0.0–0.2)

## 2019-03-30 LAB — SAVE SMEAR(SSMR), FOR PROVIDER SLIDE REVIEW

## 2019-03-30 NOTE — Progress Notes (Signed)
[place] OFFICE PROGRESS NOTE  Patient Care Team: Benito Mccreedy, MD as PCP - General (Internal Medicine) Nicanor Alcon, MD (Thoracic Surgery) Terance Ice, MD (Inactive) (Cardiology)  HEME/ONC OVERVIEW: 1. Pancytopenia  -12/2018: labs showed WBC 2.8k w/ 32% PMN (ANC 900) and 58% lymph's; plts 86k; Hgb nl (fluctuates between 10-12 since 2019)  Nutritional, infectious and coagulation studies unremarkable  PB flow cytometry, SPEP/IFE/free light chains negative   Abdominal US negative for liver disease   PERTINENT NON-HEM/ONC PROBLEMS: 1. 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   ASSESSMENT & PLAN:   Leukopenia w/ neutropenia  -Etiology unclear -WBC 3.6k w/ ANC 1100, improving  -Clinically, patient denies any symptoms of infection  -Extensive work-up, including nutritional, viral and myeloma panel, has been unremarkable -Given the modest improvement in WBC and ANC without any suspicious symptoms, we will plan to repeat CBC in ~6 weeks -If she continues to have persistent cytopenias, then we will proceed with bone marrow biopsy to rule out any underlying bone marrow changes  -I counseled patient on any abnormal symptoms of infection, including fever (temp > 100.4), night sweats, unexplained weight loss, or lymphadenopathy, for which she should seek further evaluation  Normocytic anemia -EGD and colonoscopy done on 03/21/2019 at [place] reportedly showed only benign polyps without any source of bleeding -Nutritional studies unremarkable -Hgb 11.3 today, slightly improving -Clinically, patient denies any symptoms of bleeding -We will monitor it for now; bone marrow bx if no improvement at the next visit   Thrombocytopenia -Etiology unclear -Patient reports that she was seen at [facility] in 2017 for thrombocytopenia but was told that the thrombocytopenia was mild; records unavailable in Care Everywhere  -Clinically, patient denies any  symptoms of bleeding -Plts 90k, improving -We will monitor it for now; bone marrow bx if no improvement at the next visit  -I counseled the patient on any concerning symptoms, such as abnormal bleeding or bruising, for which she is instructed to contact clinic for further evaluation  Orders Placed This Encounter  Procedures  . CBC with Differential (Cancer Center Only)    Standing Status:   Future    Standing Expiration Date:   05/03/2020  . CMP ([place] only)    Standing Status:   Future    Standing Expiration Date:   05/03/2020  . Save Smear (SSMR)    Standing Status:   Future    Standing Expiration Date:   03/29/2020  . Lactate dehydrogenase    Standing Status:   Future    Standing Expiration Date:   05/03/2020  . Hemoglobinopathy evaluation    Standing Status:   Future    Standing Expiration Date:   03/29/2020   All questions were answered. The patient knows to call the clinic with any problems, questions or concerns. No barriers to learning was detected.  Return in 6 weeks for labs and clinic follow-up.  [name], MD 03/30/2019 2:47 PM  CHIEF COMPLAINT: "I still have some back pain"  INTERVAL HISTORY: Ms. Stoneman returns to clinic for follow-up of pancytopenia.  Patient reports that she underwent EGD and colonoscopy in end of 02/2019 at [place], which reportedly showed some benign polyps, but otherwise no evidence of malignancy or bleeding.  She also reports occasional dizziness when she stands up, but denies any syncope or falls.  She also reports intermittent back pain, occasionally severe, but she takes oxycodone with relief of the pain.  She denies any other complaint today.  SUMMARY  OF ONCOLOGIC HISTORY: Oncology History   No history exists.    REVIEW OF SYSTEMS:   Constitutional: ( - ) fevers, ( - )  chills , ( - ) night sweats Eyes: ( - ) blurriness of vision, ( - ) double vision, ( - ) watery eyes Ears, nose, mouth, throat, and face: ( - ) mucositis, ( - ) sore  throat Respiratory: ( - ) cough, ( - ) dyspnea, ( - ) wheezes Cardiovascular: ( - ) palpitation, ( - ) chest discomfort, ( - ) lower extremity swelling Gastrointestinal:  ( - ) nausea, ( - ) heartburn, ( - ) change in bowel habits Skin: ( - ) abnormal skin rashes Lymphatics: ( - ) new lymphadenopathy, ( - ) easy bruising Neurological: ( - ) numbness, ( - ) tingling, ( - ) new weaknesses Behavioral/Psych: ( - ) mood change, ( - ) new changes  All other systems were reviewed with the patient and are negative.  I have reviewed the past medical history, past surgical history, social history and family history with the patient and they are unchanged from previous note.  ALLERGIES:  is allergic to ampicillin.  MEDICATIONS:  Current Outpatient Medications  Medication Sig Dispense Refill  . amLODipine (NORVASC) 10 MG tablet Take 1 tablet (10 mg total) by mouth daily. 30 tablet 0  . aspirin 81 MG tablet Take 81 mg by mouth daily.      . Calcium Carbonate-Vitamin D (CALCIUM-VITAMIN D) 500-200 MG-UNIT per tablet Take 1 tablet by mouth 2 (two) times daily with a meal.      . diclofenac sodium (VOLTAREN) 1 % GEL APPLY 4GM TO AFFECTED JOINTS EVERY 6 HOURS.    Marland Kitchen GAVILYTE-G 236 g solution See admin instructions.    Marland Kitchen glimepiride (AMARYL) 2 MG tablet Take 2 mg by mouth daily.    . hydrocortisone cream 1 % Apply topically 4 (four) times daily. Apply to itchy areas 30 g 0  . loratadine (CLARITIN) 10 MG tablet Take 1 tablet (10 mg total) by mouth daily. 14 tablet 0  . meloxicam (MOBIC) 15 MG tablet Take 15 mg by mouth daily.  0  . metFORMIN (GLUCOPHAGE) 500 MG tablet Take 500 mg by mouth daily.   1  . methocarbamol (ROBAXIN) 500 MG tablet Take 500 mg by mouth 2 (two) times daily.  0  . metoprolol succinate (TOPROL-XL) 25 MG 24 hr tablet Take 12.5 mg by mouth daily.  0  . NARCAN 4 MG/0.1ML LIQD nasal spray kit     . omeprazole (PRILOSEC) 40 MG capsule TAKE 1 CAPSULE BY MOUTH ONCE DAILY FOR 90 DAYS    .  ondansetron (ZOFRAN-ODT) 4 MG disintegrating tablet Take 1 tablet (4 mg total) by mouth every 8 (eight) hours as needed for nausea or vomiting. 20 tablet 2  . oxyCODONE-acetaminophen (PERCOCET) 10-325 MG tablet Take 1 tablet by mouth 3 (three) times daily.  0  . pregabalin (LYRICA) 75 MG capsule TAKE 1 CAPSULE BY MOUTH EVERY 6 HOURS *FILL 60 DAYS FROM 3/25    . PREMARIN vaginal cream INSERT 0.5 GRAMS VAGINALLY TWICE A WEEK FOR 4 WEEKS THEN AS NEEDED    . rosuvastatin (CRESTOR) 20 MG tablet Take 20 mg by mouth daily.  1  . Vitamin D, Ergocalciferol, (DRISDOL) 50000 units CAPS capsule TAKE 1 CAPSULE BY MOUTH TWICE A WEEK  3  . famotidine (PEPCID) 20 MG tablet Take 1 tablet (20 mg total) by mouth 2 (two) times daily for 7  days. 14 tablet 0   No current facility-administered medications for this visit.     PHYSICAL EXAMINATION: ECOG PERFORMANCE STATUS: 1 - Symptomatic but completely ambulatory  Today's Vitals   03/30/19 1411  BP: 139/69  Pulse: 67  Resp: 17  Temp: (!) 97.3 F (36.3 C)  TempSrc: Oral  SpO2: 100%  Weight: 170 lb 12.8 oz (77.5 kg)   Body mass index is 29.32 kg/m.  Filed Weights   03/30/19 1411  Weight: 170 lb 12.8 oz (77.5 kg)    GENERAL: alert, no distress and comfortable SKIN: skin color, texture, turgor are normal, no rashes or significant lesions EYES: conjunctiva are pink and non-injected, sclera clear OROPHARYNX: no exudate, no erythema; lips, buccal mucosa, and tongue normal  NECK: supple, non-tender LYMPH:  no palpable lymphadenopathy in the cervical  LUNGS: clear to auscultation with normal breathing effort HEART: regular rate & rhythm and no murmurs and no lower extremity edema ABDOMEN: soft, non-tender, non-distended, normal bowel sounds Musculoskeletal: no cyanosis of digits and no clubbing, wears an abdominal brace for back pain PSYCH: alert & oriented x 3, fluent speech  LABORATORY DATA:  I have reviewed the data as listed    Component Value  Date/Time   NA 142 03/30/2019 1401   K 3.5 03/30/2019 1401   CL 105 03/30/2019 1401   CO2 30 03/30/2019 1401   GLUCOSE 68 (L) 03/30/2019 1401   BUN 12 03/30/2019 1401   CREATININE 1.00 03/30/2019 1401   CREATININE 1.49 (H) 04/19/2018 1652   CALCIUM 9.0 03/30/2019 1401   PROT 7.4 03/30/2019 1401   ALBUMIN 4.2 03/30/2019 1401   AST 17 03/30/2019 1401   ALT 12 03/30/2019 1401   ALKPHOS 71 03/30/2019 1401   BILITOT 0.5 03/30/2019 1401   GFRNONAA >60 03/30/2019 1401   GFRNONAA 40 (L) 04/19/2018 1652   GFRAA >60 03/30/2019 1401   GFRAA 47 (L) 04/19/2018 1652    No results found for: SPEP, UPEP  Lab Results  Component Value Date   WBC 3.6 (L) 03/30/2019   NEUTROABS 1.1 (L) 03/30/2019   HGB 11.3 (L) 03/30/2019   HCT 33.5 (L) 03/30/2019   MCV 85.9 03/30/2019   PLT 90 (L) 03/30/2019      Chemistry      Component Value Date/Time   NA 142 03/30/2019 1401   K 3.5 03/30/2019 1401   CL 105 03/30/2019 1401   CO2 30 03/30/2019 1401   BUN 12 03/30/2019 1401   CREATININE 1.00 03/30/2019 1401   CREATININE 1.49 (H) 04/19/2018 1652      Component Value Date/Time   CALCIUM 9.0 03/30/2019 1401   ALKPHOS 71 03/30/2019 1401   AST 17 03/30/2019 1401   ALT 12 03/30/2019 1401   BILITOT 0.5 03/30/2019 1401       RADIOGRAPHIC STUDIES: I have personally reviewed the radiological images as listed below and agreed with the findings in the report. No results found.

## 2019-03-30 NOTE — Telephone Encounter (Signed)
Called and LMVm for patient regarding appointments added per 8/7 los

## 2019-04-02 LAB — LACTATE DEHYDROGENASE: LDH: 213 U/L — ABNORMAL HIGH (ref 98–192)

## 2019-04-05 DIAGNOSIS — K219 Gastro-esophageal reflux disease without esophagitis: Secondary | ICD-10-CM | POA: Diagnosis not present

## 2019-04-05 DIAGNOSIS — K5903 Drug induced constipation: Secondary | ICD-10-CM | POA: Diagnosis not present

## 2019-05-11 ENCOUNTER — Other Ambulatory Visit: Payer: Self-pay

## 2019-05-11 ENCOUNTER — Inpatient Hospital Stay: Payer: Federal, State, Local not specified - PPO | Attending: Hematology

## 2019-05-11 ENCOUNTER — Inpatient Hospital Stay (HOSPITAL_BASED_OUTPATIENT_CLINIC_OR_DEPARTMENT_OTHER): Payer: Federal, State, Local not specified - PPO | Admitting: Hematology

## 2019-05-11 ENCOUNTER — Encounter: Payer: Self-pay | Admitting: Hematology

## 2019-05-11 VITALS — BP 133/65 | HR 51 | Temp 97.8°F | Resp 17 | Ht 64.0 in | Wt 174.0 lb

## 2019-05-11 DIAGNOSIS — L299 Pruritus, unspecified: Secondary | ICD-10-CM

## 2019-05-11 DIAGNOSIS — D61818 Other pancytopenia: Secondary | ICD-10-CM | POA: Insufficient documentation

## 2019-05-11 DIAGNOSIS — Z7984 Long term (current) use of oral hypoglycemic drugs: Secondary | ICD-10-CM | POA: Diagnosis not present

## 2019-05-11 DIAGNOSIS — Z79899 Other long term (current) drug therapy: Secondary | ICD-10-CM | POA: Insufficient documentation

## 2019-05-11 DIAGNOSIS — D649 Anemia, unspecified: Secondary | ICD-10-CM

## 2019-05-11 DIAGNOSIS — D696 Thrombocytopenia, unspecified: Secondary | ICD-10-CM

## 2019-05-11 DIAGNOSIS — Z7982 Long term (current) use of aspirin: Secondary | ICD-10-CM | POA: Diagnosis not present

## 2019-05-11 DIAGNOSIS — D72819 Decreased white blood cell count, unspecified: Secondary | ICD-10-CM

## 2019-05-11 LAB — CBC WITH DIFFERENTIAL (CANCER CENTER ONLY)
Abs Immature Granulocytes: 0 10*3/uL (ref 0.00–0.07)
Basophils Absolute: 0 10*3/uL (ref 0.0–0.1)
Basophils Relative: 1 %
Eosinophils Absolute: 0 10*3/uL (ref 0.0–0.5)
Eosinophils Relative: 1 %
HCT: 35.4 % — ABNORMAL LOW (ref 36.0–46.0)
Hemoglobin: 11.9 g/dL — ABNORMAL LOW (ref 12.0–15.0)
Immature Granulocytes: 0 %
Lymphocytes Relative: 52 %
Lymphs Abs: 2.2 10*3/uL (ref 0.7–4.0)
MCH: 28.9 pg (ref 26.0–34.0)
MCHC: 33.6 g/dL (ref 30.0–36.0)
MCV: 85.9 fL (ref 80.0–100.0)
Monocytes Absolute: 0.3 10*3/uL (ref 0.1–1.0)
Monocytes Relative: 8 %
Neutro Abs: 1.5 10*3/uL — ABNORMAL LOW (ref 1.7–7.7)
Neutrophils Relative %: 38 %
Platelet Count: 97 10*3/uL — ABNORMAL LOW (ref 150–400)
RBC: 4.12 MIL/uL (ref 3.87–5.11)
RDW: 13.2 % (ref 11.5–15.5)
WBC Count: 4 10*3/uL (ref 4.0–10.5)
nRBC: 0 % (ref 0.0–0.2)

## 2019-05-11 LAB — CMP (CANCER CENTER ONLY)
ALT: 11 U/L (ref 0–44)
AST: 18 U/L (ref 15–41)
Albumin: 4.6 g/dL (ref 3.5–5.0)
Alkaline Phosphatase: 79 U/L (ref 38–126)
Anion gap: 5 (ref 5–15)
BUN: 15 mg/dL (ref 6–20)
CO2: 31 mmol/L (ref 22–32)
Calcium: 9.7 mg/dL (ref 8.9–10.3)
Chloride: 104 mmol/L (ref 98–111)
Creatinine: 1.13 mg/dL — ABNORMAL HIGH (ref 0.44–1.00)
GFR, Est AFR Am: >60 mL/min (ref >60)
GFR, Estimated: 56 mL/min — ABNORMAL LOW (ref >60)
Glucose, Bld: 74 mg/dL (ref 70–99)
Potassium: 3.7 mmol/L (ref 3.5–5.1)
Sodium: 140 mmol/L (ref 135–145)
Total Bilirubin: 0.6 mg/dL (ref 0.3–1.2)
Total Protein: 7.9 g/dL (ref 6.5–8.1)

## 2019-05-11 LAB — SAVE SMEAR(SSMR), FOR PROVIDER SLIDE REVIEW

## 2019-05-11 NOTE — Progress Notes (Signed)
[place] OFFICE PROGRESS NOTE  Patient Care Team: Benito Mccreedy, MD as PCP - General (Internal Medicine) Nicanor Alcon, MD (Thoracic Surgery) Terance Ice, MD (Inactive) (Cardiology)  HEME/ONC OVERVIEW: 1. Pancytopenia  -Normal CBC in 2019 except anemia  -12/2018: labs showed WBC 2.8k w/ 32% PMN (ANC 900) and 58% lymph's; plts 86k; Hgb nl (fluctuates between 10-12 since 2019)  Nutritional, infectious and coagulation studies unremarkable  PB flow cytometry, SPEP/IFE/free light chains negative   Abdominal US negative for liver disease  -On observation   PERTINENT NON-HEM/ONC PROBLEMS: 1. 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 Pylori  -Nutritional studies unremarkable -Hgb 11.9 today, improving -I personally reviewed the patient's peripheral blood smear today.  The red blood cells were of normal morphology.  There was no schistocytosis.  The white blood cells were relatively normal in morphology, with some slight increase in the percentage of lymphocytes and monocytes, but there were no definite dysplastic changes or circulating blasts. The platelets were mostly normal in size with some immature platelets.  There were no platelet clumping. -Clinically, patient denies any symptoms of bleeding -Given the improving Hgb and plt count, as well as normalization of WBC, we can monitor her counts closely and defer bone marrow bx unless she develops worsening cytopenias or suspicious symptoms  -I have ordered Coomb's study at the next visit to rule out any autoimmune process, as Evan's syndrome (immune cytopenias) can present with both anemia and thrombocytopenia   Thrombocytopenia -Etiology unclear -Plts 97k today, improving  -Clinically, patient denies any symptoms of bleeding or excess bruising -As discussed above, we  can monitor her counts closely and if she develops any suspicious symptoms, we can pursue bone marrow bx  -I counseled the patient on any concerning symptoms, such as unexplained fever, night sweats, weight loss, or abnormal bleeding/bruising, for which she is instructed to contact the clinic for further evaluation  Pruritus of hands  -New; possibly due to eczema  -No skin desquamation, drainage or other abnormalities on exam  -I encouraged the patient to use moisturizer and may also try a low-potency steroid cream -If no improvement, she is instructed to contact her PCP for further evaluation   No orders of the defined types were placed in this encounter.  All questions were answered. The patient knows to call the clinic with any problems, questions or concerns. No barriers to learning was detected.  Return in 3 months for labs and clinic follow-up.  [name], MD 05/11/2019 3:20 PM  CHIEF COMPLAINT: "I am doing fine"  INTERVAL HISTORY: [name] returns to clinic for follow-up of pancytopenia.  The patient reports that she has had a persistent itching in her palms of her hands without any obvious skin lesion.  She denies any change in her detergent or medications.  She has not used steroid or creams.  She denies any constitutional symptoms, chest pain, nausea, vomiting, or abnormal bleeding/bruising.  REVIEW OF SYSTEMS:   Constitutional: ( - ) fevers, ( - )  chills , ( - ) night sweats Eyes: ( - ) blurriness of vision, ( - ) double vision, ( - ) watery eyes Ears, nose, mouth, throat, and face: ( - ) mucositis, ( - ) sore throat Respiratory: ( - ) cough, ( - ) dyspnea, ( - ) wheezes Cardiovascular: ( - ) palpitation, ( - )  chest discomfort, ( - ) lower extremity swelling Gastrointestinal:  ( - ) nausea, ( - ) heartburn, ( - ) change in bowel habits Skin: ( - ) abnormal skin rashes Lymphatics: ( - ) new lymphadenopathy, ( - ) easy bruising Neurological: ( - ) numbness, ( - )  tingling, ( - ) new weaknesses Behavioral/Psych: ( - ) mood change, ( - ) new changes  All other systems were reviewed with the patient and are negative.  I have reviewed the past medical history, past surgical history, social history and family history with the patient and they are unchanged from previous note.  ALLERGIES:  is allergic to ampicillin.  MEDICATIONS:  Current Outpatient Medications  Medication Sig Dispense Refill  . amLODipine (NORVASC) 10 MG tablet Take 1 tablet (10 mg total) by mouth daily. 30 tablet 0  . aspirin 81 MG tablet Take 81 mg by mouth daily.      . Calcium Carbonate-Vitamin D (CALCIUM-VITAMIN D) 500-200 MG-UNIT per tablet Take 1 tablet by mouth 2 (two) times daily with a meal.      . diclofenac sodium (VOLTAREN) 1 % GEL APPLY 4GM TO AFFECTED JOINTS EVERY 6 HOURS.    . famotidine (PEPCID) 20 MG tablet Take 1 tablet (20 mg total) by mouth 2 (two) times daily for 7 days. 14 tablet 0  . GAVILYTE-G 236 g solution See admin instructions.    Marland Kitchen glimepiride (AMARYL) 2 MG tablet Take 2 mg by mouth daily.    . hydrocortisone cream 1 % Apply topically 4 (four) times daily. Apply to itchy areas 30 g 0  . loratadine (CLARITIN) 10 MG tablet Take 1 tablet (10 mg total) by mouth daily. 14 tablet 0  . meloxicam (MOBIC) 15 MG tablet Take 15 mg by mouth daily.  0  . metFORMIN (GLUCOPHAGE) 500 MG tablet Take 500 mg by mouth daily.   1  . methocarbamol (ROBAXIN) 500 MG tablet Take 500 mg by mouth 2 (two) times daily.  0  . metoprolol succinate (TOPROL-XL) 25 MG 24 hr tablet Take 12.5 mg by mouth daily.  0  . NARCAN 4 MG/0.1ML LIQD nasal spray kit     . omeprazole (PRILOSEC) 40 MG capsule TAKE 1 CAPSULE BY MOUTH ONCE DAILY FOR 90 DAYS    . ondansetron (ZOFRAN-ODT) 4 MG disintegrating tablet Take 1 tablet (4 mg total) by mouth every 8 (eight) hours as needed for nausea or vomiting. 20 tablet 2  . oxyCODONE-acetaminophen (PERCOCET) 10-325 MG tablet Take 1 tablet by mouth 3 (three) times  daily.  0  . pregabalin (LYRICA) 75 MG capsule TAKE 1 CAPSULE BY MOUTH EVERY 6 HOURS *FILL 60 DAYS FROM 3/25    . PREMARIN vaginal cream INSERT 0.5 GRAMS VAGINALLY TWICE A WEEK FOR 4 WEEKS THEN AS NEEDED    . rosuvastatin (CRESTOR) 20 MG tablet Take 20 mg by mouth daily.  1  . Vitamin D, Ergocalciferol, (DRISDOL) 50000 units CAPS capsule TAKE 1 CAPSULE BY MOUTH TWICE A WEEK  3   No current facility-administered medications for this visit.     PHYSICAL EXAMINATION: ECOG PERFORMANCE STATUS: 1 - Symptomatic but completely ambulatory  Today's Vitals   05/11/19 1453  BP: 133/65  Pulse: (!) 51  Resp: 17  Temp: 97.8 F (36.6 C)  TempSrc: Temporal  Weight: 174 lb (78.9 kg)  Height: _0  (1.626 m)   Body mass index is 29.87 kg/m.  Filed Weights   05/11/19 1453  Weight: 174 lb (78.9 kg)  GENERAL: alert, no distress and comfortable SKIN: skin color, texture, turgor are normal, no rashes or significant lesions EYES: conjunctiva are pink and non-injected, sclera clear OROPHARYNX: no exudate, no erythema; lips, buccal mucosa, and tongue normal  NECK: supple, non-tender LYMPH:  no palpable lymphadenopathy in the cervical LUNGS: clear to auscultation with normal breathing effort HEART: regular rate & rhythm and no murmurs and no lower extremity edema ABDOMEN: soft, non-tender, non-distended, normal bowel sounds Musculoskeletal: no cyanosis of digits and no clubbing  PSYCH: alert & oriented x 3, fluent speech NEURO: no focal motor/sensory deficits  LABORATORY DATA:  I have reviewed the data as listed    Component Value Date/Time   NA 140 05/11/2019 1436   K 3.7 05/11/2019 1436   CL 104 05/11/2019 1436   CO2 31 05/11/2019 1436   GLUCOSE 74 05/11/2019 1436   BUN 15 05/11/2019 1436   CREATININE 1.13 (H) 05/11/2019 1436   CREATININE 1.49 (H) 04/19/2018 1652   CALCIUM 9.7 05/11/2019 1436   PROT 7.9 05/11/2019 1436   ALBUMIN 4.6 05/11/2019 1436   AST 18 05/11/2019 1436   ALT 11  05/11/2019 1436   ALKPHOS 79 05/11/2019 1436   BILITOT 0.6 05/11/2019 1436   GFRNONAA 56 (L) 05/11/2019 1436   GFRNONAA 40 (L) 04/19/2018 1652   GFRAA >60 05/11/2019 1436   GFRAA 47 (L) 04/19/2018 1652    No results found for: SPEP, UPEP  Lab Results  Component Value Date   WBC 4.0 05/11/2019   NEUTROABS 1.5 (L) 05/11/2019   HGB 11.9 (L) 05/11/2019   HCT 35.4 (L) 05/11/2019   MCV 85.9 05/11/2019   PLT 97 (L) 05/11/2019      Chemistry      Component Value Date/Time   NA 140 05/11/2019 1436   K 3.7 05/11/2019 1436   CL 104 05/11/2019 1436   CO2 31 05/11/2019 1436   BUN 15 05/11/2019 1436   CREATININE 1.13 (H) 05/11/2019 1436   CREATININE 1.49 (H) 04/19/2018 1652      Component Value Date/Time   CALCIUM 9.7 05/11/2019 1436   ALKPHOS 79 05/11/2019 1436   AST 18 05/11/2019 1436   ALT 11 05/11/2019 1436   BILITOT 0.6 05/11/2019 1436       RADIOGRAPHIC STUDIES: I have personally reviewed the radiological images as listed below and agreed with the findings in the report. No results found.

## 2019-05-14 LAB — HEMOGLOBINOPATHY EVALUATION
Hgb A2 Quant: 3.7 % — ABNORMAL HIGH (ref 1.8–3.2)
Hgb A: 58.1 % — ABNORMAL LOW (ref 96.4–98.8)
Hgb C: 0 %
Hgb F Quant: 0 % (ref 0.0–2.0)
Hgb S Quant: 38.2 % — ABNORMAL HIGH
Hgb Variant: 0 %

## 2019-05-14 LAB — LACTATE DEHYDROGENASE: LDH: 216 U/L — ABNORMAL HIGH (ref 98–192)

## 2019-05-18 ENCOUNTER — Telehealth: Payer: Self-pay | Admitting: *Deleted

## 2019-05-18 NOTE — Telephone Encounter (Signed)
Called Mr. Camera to let her know that her blood tests showed that she has a sickle cell trait There is no treatment needed. We will monitor her blood counts as discussed during clinic.   He will pass along information.  Appreciative of the call.

## 2019-05-18 NOTE — Telephone Encounter (Signed)
-----   Message from [name], MD sent at 05/18/2019 11:22 AM EDT ----- Geraldo Pitter,  Can you let Ms. Hoecker know that her blood tests showed that she has a sickle cell trait? There is no treatment needed. We will monitor her blood counts as discussed during clinic.  Thanks.  [place]  ----- Message ----- From: Buel Ream, [facility] [place] Sent: 05/11/2019   2:52 PM EDT To: [name], MD

## 2019-05-22 DIAGNOSIS — I10 Essential (primary) hypertension: Secondary | ICD-10-CM | POA: Diagnosis not present

## 2019-05-22 DIAGNOSIS — K219 Gastro-esophageal reflux disease without esophagitis: Secondary | ICD-10-CM | POA: Diagnosis not present

## 2019-05-22 DIAGNOSIS — E782 Mixed hyperlipidemia: Secondary | ICD-10-CM | POA: Diagnosis not present

## 2019-05-22 DIAGNOSIS — E119 Type 2 diabetes mellitus without complications: Secondary | ICD-10-CM | POA: Diagnosis not present

## 2019-06-21 DIAGNOSIS — E119 Type 2 diabetes mellitus without complications: Secondary | ICD-10-CM | POA: Diagnosis not present

## 2019-06-21 DIAGNOSIS — E782 Mixed hyperlipidemia: Secondary | ICD-10-CM | POA: Diagnosis not present

## 2019-06-21 DIAGNOSIS — I10 Essential (primary) hypertension: Secondary | ICD-10-CM | POA: Diagnosis not present

## 2019-06-21 DIAGNOSIS — K219 Gastro-esophageal reflux disease without esophagitis: Secondary | ICD-10-CM | POA: Diagnosis not present

## 2019-07-12 ENCOUNTER — Telehealth: Payer: Self-pay | Admitting: Hematology

## 2019-07-12 NOTE — Telephone Encounter (Signed)
Dr Maylon Peppers December appts rescheduled due to his PAL day.  Letter/Calendar mailed

## 2019-08-09 ENCOUNTER — Other Ambulatory Visit: Payer: Self-pay | Admitting: Family

## 2019-08-09 DIAGNOSIS — D696 Thrombocytopenia, unspecified: Secondary | ICD-10-CM

## 2019-08-09 DIAGNOSIS — D649 Anemia, unspecified: Secondary | ICD-10-CM

## 2019-08-10 ENCOUNTER — Ambulatory Visit: Payer: Federal, State, Local not specified - PPO | Admitting: Hematology

## 2019-08-10 ENCOUNTER — Other Ambulatory Visit: Payer: Self-pay

## 2019-08-10 ENCOUNTER — Other Ambulatory Visit: Payer: Federal, State, Local not specified - PPO

## 2019-08-10 ENCOUNTER — Inpatient Hospital Stay: Payer: Federal, State, Local not specified - PPO | Attending: Hematology

## 2019-08-10 ENCOUNTER — Encounter: Payer: Self-pay | Admitting: Family

## 2019-08-10 ENCOUNTER — Inpatient Hospital Stay (HOSPITAL_BASED_OUTPATIENT_CLINIC_OR_DEPARTMENT_OTHER): Payer: Federal, State, Local not specified - PPO | Admitting: Family

## 2019-08-10 VITALS — BP 134/73 | HR 52 | Temp 97.7°F | Resp 18 | Ht 64.0 in | Wt 173.8 lb

## 2019-08-10 DIAGNOSIS — D649 Anemia, unspecified: Secondary | ICD-10-CM

## 2019-08-10 DIAGNOSIS — D573 Sickle-cell trait: Secondary | ICD-10-CM

## 2019-08-10 DIAGNOSIS — D696 Thrombocytopenia, unspecified: Secondary | ICD-10-CM | POA: Insufficient documentation

## 2019-08-10 DIAGNOSIS — Z7982 Long term (current) use of aspirin: Secondary | ICD-10-CM | POA: Diagnosis not present

## 2019-08-10 DIAGNOSIS — Z79899 Other long term (current) drug therapy: Secondary | ICD-10-CM | POA: Insufficient documentation

## 2019-08-10 DIAGNOSIS — D61818 Other pancytopenia: Secondary | ICD-10-CM | POA: Insufficient documentation

## 2019-08-10 LAB — CBC WITH DIFFERENTIAL (CANCER CENTER ONLY)
Abs Immature Granulocytes: 0 10*3/uL (ref 0.00–0.07)
Basophils Absolute: 0 10*3/uL (ref 0.0–0.1)
Basophils Relative: 1 %
Eosinophils Absolute: 0.1 10*3/uL (ref 0.0–0.5)
Eosinophils Relative: 2 %
HCT: 33.7 % — ABNORMAL LOW (ref 36.0–46.0)
Hemoglobin: 11.4 g/dL — ABNORMAL LOW (ref 12.0–15.0)
Immature Granulocytes: 0 %
Lymphocytes Relative: 52 %
Lymphs Abs: 1.9 10*3/uL (ref 0.7–4.0)
MCH: 28.7 pg (ref 26.0–34.0)
MCHC: 33.8 g/dL (ref 30.0–36.0)
MCV: 84.9 fL (ref 80.0–100.0)
Monocytes Absolute: 0.2 10*3/uL (ref 0.1–1.0)
Monocytes Relative: 7 %
Neutro Abs: 1.4 10*3/uL — ABNORMAL LOW (ref 1.7–7.7)
Neutrophils Relative %: 38 %
Platelet Count: 98 10*3/uL — ABNORMAL LOW (ref 150–400)
RBC: 3.97 MIL/uL (ref 3.87–5.11)
RDW: 13.2 % (ref 11.5–15.5)
WBC Count: 3.6 10*3/uL — ABNORMAL LOW (ref 4.0–10.5)
nRBC: 0 % (ref 0.0–0.2)

## 2019-08-10 LAB — CMP (CANCER CENTER ONLY)
ALT: 13 U/L (ref 0–44)
AST: 19 U/L (ref 15–41)
Albumin: 4.4 g/dL (ref 3.5–5.0)
Alkaline Phosphatase: 81 U/L (ref 38–126)
Anion gap: 5 (ref 5–15)
BUN: 15 mg/dL (ref 6–20)
CO2: 32 mmol/L (ref 22–32)
Calcium: 9.6 mg/dL (ref 8.9–10.3)
Chloride: 105 mmol/L (ref 98–111)
Creatinine: 1.09 mg/dL — ABNORMAL HIGH (ref 0.44–1.00)
GFR, Est AFR Am: >60 mL/min (ref >60)
GFR, Estimated: 58 mL/min — ABNORMAL LOW (ref >60)
Glucose, Bld: 183 mg/dL — ABNORMAL HIGH (ref 70–99)
Potassium: 4.3 mmol/L (ref 3.5–5.1)
Sodium: 142 mmol/L (ref 135–145)
Total Bilirubin: 0.5 mg/dL (ref 0.3–1.2)
Total Protein: 7.8 g/dL (ref 6.5–8.1)

## 2019-08-10 LAB — SAVE SMEAR(SSMR), FOR PROVIDER SLIDE REVIEW

## 2019-08-10 MED ORDER — FOLIC ACID 1 MG PO TABS: 1.0000 mg | ORAL_TABLET | Freq: Every day | ORAL | 11 refills | Status: DC

## 2019-08-10 NOTE — Progress Notes (Signed)
Hematology and Oncology Follow Up Visit  [name] [id number] [birth year] 53 y.o. 08/10/2019   Principle Diagnosis:  Normocytic anemia  Thrombocytopenia  Sickle cell trait   Current Therapy:   Observation Folic acid 1 mg PO daily    Interim History:  Ms. [name] had a telephone visit for follow-up. She is doing well and has no complaints at this time.  Hgb is stable at 11.4 and platelet count is 98.  No issues with bleeding. No bruising or petechiae.  Her abdominal CT back in June was normal.  She denies any join aches or pains.  No fever, chills, n/v, cough, rash, dizziness, SOB, chest pain, palpitations, abdominal pain or changes in bowel or bladder habits.  No swelling, tenderness, numbness or tingling in her extremities.  No falls or syncopal episodes to report.  She is eating well and staying properly hydrated. Her weight is stable.   ECOG Performance Status: 0 - Asymptomatic  Medications:  Allergies as of 08/10/2019      Reactions   Ampicillin Hives, Itching   Has patient had a PCN reaction causing immediate rash, facial/tongue/throat swelling, SOB or lightheadedness with hypotension: Y Has patient had a PCN reaction causing severe rash involving mucus membranes or skin necrosis: Y Has patient had a PCN reaction that required hospitalization: Y Has patient had a PCN reaction occurring within the last 10 years: Y If all of the above answers are "NO", then may proceed with Cephalosporin use.      Medication List       Accurate as of August 10, 2019  1:53 PM. If you have any questions, ask your nurse or doctor.        amLODipine 10 MG tablet Commonly known as: NORVASC Take 1 tablet (10 mg total) by mouth daily.   aspirin 81 MG tablet Take 81 mg by mouth daily.   calcium-vitamin D 500-200 MG-UNIT tablet Take 1 tablet by mouth 2 (two) times daily with a meal.   diclofenac sodium 1 % Gel Commonly known as: VOLTAREN APPLY 4GM TO AFFECTED JOINTS EVERY  6 HOURS.   famotidine 20 MG tablet Commonly known as: PEPCID Take 1 tablet (20 mg total) by mouth 2 (two) times daily for 7 days.   GaviLyte-G 236 g solution Generic drug: polyethylene glycol See admin instructions.   glimepiride 2 MG tablet Commonly known as: AMARYL Take 2 mg by mouth daily.   hydrocortisone cream 1 % Apply topically 4 (four) times daily. Apply to itchy areas   loratadine 10 MG tablet Commonly known as: CLARITIN Take 1 tablet (10 mg total) by mouth daily.   meloxicam 15 MG tablet Commonly known as: MOBIC Take 15 mg by mouth daily.   metFORMIN 500 MG tablet Commonly known as: GLUCOPHAGE Take 500 mg by mouth daily.   methocarbamol 500 MG tablet Commonly known as: ROBAXIN Take 500 mg by mouth 2 (two) times daily.   metoprolol succinate 25 MG 24 hr tablet Commonly known as: TOPROL-XL Take 12.5 mg by mouth daily.   Narcan 4 MG/0.1ML Liqd nasal spray kit Generic drug: naloxone   omeprazole 40 MG capsule Commonly known as: PRILOSEC TAKE 1 CAPSULE BY MOUTH ONCE DAILY FOR 90 DAYS   ondansetron 4 MG disintegrating tablet Commonly known as: ZOFRAN-ODT Take 1 tablet (4 mg total) by mouth every 8 (eight) hours as needed for nausea or vomiting.   oxyCODONE-acetaminophen 10-325 MG tablet Commonly known as: PERCOCET Take 1 tablet by mouth 3 (three) times daily.  pregabalin 75 MG capsule Commonly known as: LYRICA TAKE 1 CAPSULE BY MOUTH EVERY 6 HOURS *FILL 60 DAYS FROM 3/25   Premarin vaginal cream Generic drug: conjugated estrogens INSERT 0.5 GRAMS VAGINALLY TWICE A WEEK FOR 4 WEEKS THEN AS NEEDED   rosuvastatin 20 MG tablet Commonly known as: CRESTOR Take 20 mg by mouth daily.   Vitamin D (Ergocalciferol) 1.25 MG (50000 UT) Caps capsule Commonly known as: DRISDOL TAKE 1 CAPSULE BY MOUTH TWICE A WEEK       Allergies:  Allergies  Allergen Reactions  . Ampicillin Hives and Itching    Has patient had a PCN reaction causing immediate rash,  facial/tongue/throat swelling, SOB or lightheadedness with hypotension: Y Has patient had a PCN reaction causing severe rash involving mucus membranes or skin necrosis: Y Has patient had a PCN reaction that required hospitalization: Y Has patient had a PCN reaction occurring within the last 10 years: Y If all of the above answers are "NO", then may proceed with Cephalosporin use.     Past Medical History, Surgical history, Social history, and Family History were reviewed and updated.  Review of Systems: All other 10 point review of systems is negative.   Physical Exam:  vitals were not taken for this visit.   Wt Readings from Last 3 Encounters:  05/11/19 174 lb (78.9 kg)  03/30/19 170 lb 12.8 oz (77.5 kg)  03/02/19 173 lb 6.4 oz (78.7 kg)     Lab Results  Component Value Date   WBC 4.0 05/11/2019   HGB 11.9 (L) 05/11/2019   HCT 35.4 (L) 05/11/2019   MCV 85.9 05/11/2019   PLT 97 (L) 05/11/2019   Lab Results  Component Value Date   FERRITIN 45 02/02/2019   IRON 84 02/02/2019   TIBC 311 02/02/2019   UIBC 227 02/02/2019   IRONPCTSAT 27 02/02/2019   Lab Results  Component Value Date   RETICCTPCT 1.0 01/27/2018   RBC 4.12 05/11/2019   Lab Results  Component Value Date   KPAFRELGTCHN 25.5 (H) 03/02/2019   LAMBDASER 16.4 03/02/2019   KAPLAMBRATIO 1.55 03/02/2019   Lab Results  Component Value Date   IGGSERUM 1,683 (H) 03/02/2019   IGA 232 03/02/2019   IGMSERUM 123 03/02/2019   Lab Results  Component Value Date   TOTALPROTELP 7.0 03/02/2019     Chemistry      Component Value Date/Time   NA 140 05/11/2019 1436   K 3.7 05/11/2019 1436   CL 104 05/11/2019 1436   CO2 31 05/11/2019 1436   BUN 15 05/11/2019 1436   CREATININE 1.13 (H) 05/11/2019 1436   CREATININE 1.49 (H) 04/19/2018 1652      Component Value Date/Time   CALCIUM 9.7 05/11/2019 1436   ALKPHOS 79 05/11/2019 1436   AST 18 05/11/2019 1436   ALT 11 05/11/2019 1436   BILITOT 0.6 05/11/2019 1436        Impression and Plan: [name] is a very pleasant 53 yo female with normocytic anemia, thrombocytopenia and sickle cell trait.  Her counts are remaining stable.  We will go ahead and get her started on folic acid 1 mg PO daily.  She will return for follow-up in 4 months.  She will contact our office with any questions or concerns. We can certainly see her sooner if needed.   Laverna Peace, NP 12/18/20201:53 PM

## 2019-08-13 LAB — HEMOGLOBINOPATHY EVALUATION
Hgb A2 Quant: 3.7 % — ABNORMAL HIGH (ref 1.8–3.2)
Hgb A: 57.9 % — ABNORMAL LOW (ref 96.4–98.8)
Hgb C: 0 %
Hgb F Quant: 0 % (ref 0.0–2.0)
Hgb S Quant: 38.4 % — ABNORMAL HIGH
Hgb Variant: 0 %

## 2019-08-13 LAB — LACTATE DEHYDROGENASE: LDH: 223 U/L — ABNORMAL HIGH (ref 98–192)

## 2019-10-30 DIAGNOSIS — M542 Cervicalgia: Secondary | ICD-10-CM | POA: Diagnosis not present

## 2019-10-30 DIAGNOSIS — M545 Low back pain: Secondary | ICD-10-CM | POA: Diagnosis not present

## 2019-10-30 DIAGNOSIS — M7610 Psoas tendinitis, unspecified hip: Secondary | ICD-10-CM | POA: Diagnosis not present

## 2019-10-30 DIAGNOSIS — G890 Central pain syndrome: Secondary | ICD-10-CM | POA: Diagnosis not present

## 2019-10-30 DIAGNOSIS — M5137 Other intervertebral disc degeneration, lumbosacral region: Secondary | ICD-10-CM | POA: Diagnosis not present

## 2019-10-30 DIAGNOSIS — M4628 Osteomyelitis of vertebra, sacral and sacrococcygeal region: Secondary | ICD-10-CM | POA: Diagnosis not present

## 2019-12-06 ENCOUNTER — Other Ambulatory Visit: Payer: Self-pay

## 2019-12-06 ENCOUNTER — Encounter: Payer: Self-pay | Admitting: Hematology

## 2019-12-06 ENCOUNTER — Inpatient Hospital Stay: Payer: Medicare Other | Attending: Hematology | Admitting: Hematology

## 2019-12-06 ENCOUNTER — Inpatient Hospital Stay: Payer: Medicare Other

## 2019-12-06 ENCOUNTER — Telehealth: Payer: Self-pay | Admitting: Hematology

## 2019-12-06 VITALS — BP 132/82 | HR 51 | Temp 97.1°F | Resp 18 | Ht 64.0 in | Wt 174.0 lb

## 2019-12-06 DIAGNOSIS — Z7984 Long term (current) use of oral hypoglycemic drugs: Secondary | ICD-10-CM | POA: Insufficient documentation

## 2019-12-06 DIAGNOSIS — Z7982 Long term (current) use of aspirin: Secondary | ICD-10-CM | POA: Insufficient documentation

## 2019-12-06 DIAGNOSIS — Z79899 Other long term (current) drug therapy: Secondary | ICD-10-CM | POA: Diagnosis not present

## 2019-12-06 DIAGNOSIS — D649 Anemia, unspecified: Secondary | ICD-10-CM

## 2019-12-06 DIAGNOSIS — Z791 Long term (current) use of non-steroidal anti-inflammatories (NSAID): Secondary | ICD-10-CM | POA: Insufficient documentation

## 2019-12-06 DIAGNOSIS — D61818 Other pancytopenia: Secondary | ICD-10-CM | POA: Diagnosis not present

## 2019-12-06 DIAGNOSIS — D696 Thrombocytopenia, unspecified: Secondary | ICD-10-CM

## 2019-12-06 DIAGNOSIS — D573 Sickle-cell trait: Secondary | ICD-10-CM

## 2019-12-06 LAB — CMP (CANCER CENTER ONLY)
ALT: 11 U/L (ref 0–44)
AST: 17 U/L (ref 15–41)
Albumin: 4.3 g/dL (ref 3.5–5.0)
Alkaline Phosphatase: 65 U/L (ref 38–126)
Anion gap: 6 (ref 5–15)
BUN: 14 mg/dL (ref 6–20)
CO2: 30 mmol/L (ref 22–32)
Calcium: 9.7 mg/dL (ref 8.9–10.3)
Chloride: 105 mmol/L (ref 98–111)
Creatinine: 0.98 mg/dL (ref 0.44–1.00)
GFR, Est AFR Am: >60 mL/min (ref >60)
GFR, Estimated: >60 mL/min (ref >60)
Glucose, Bld: 104 mg/dL — ABNORMAL HIGH (ref 70–99)
Potassium: 3.8 mmol/L (ref 3.5–5.1)
Sodium: 141 mmol/L (ref 135–145)
Total Bilirubin: 0.5 mg/dL (ref 0.3–1.2)
Total Protein: 7.4 g/dL (ref 6.5–8.1)

## 2019-12-06 LAB — CBC WITH DIFFERENTIAL (CANCER CENTER ONLY)
Abs Immature Granulocytes: 0 10*3/uL (ref 0.00–0.07)
Basophils Absolute: 0 10*3/uL (ref 0.0–0.1)
Basophils Relative: 1 %
Eosinophils Absolute: 0 10*3/uL (ref 0.0–0.5)
Eosinophils Relative: 1 %
HCT: 34.3 % — ABNORMAL LOW (ref 36.0–46.0)
Hemoglobin: 11.8 g/dL — ABNORMAL LOW (ref 12.0–15.0)
Immature Granulocytes: 0 %
Lymphocytes Relative: 65 %
Lymphs Abs: 2 10*3/uL (ref 0.7–4.0)
MCH: 28.5 pg (ref 26.0–34.0)
MCHC: 34.4 g/dL (ref 30.0–36.0)
MCV: 82.9 fL (ref 80.0–100.0)
Monocytes Absolute: 0.2 10*3/uL (ref 0.1–1.0)
Monocytes Relative: 7 %
Neutro Abs: 0.8 10*3/uL — ABNORMAL LOW (ref 1.7–7.7)
Neutrophils Relative %: 26 %
Platelet Count: 87 10*3/uL — ABNORMAL LOW (ref 150–400)
RBC: 4.14 MIL/uL (ref 3.87–5.11)
RDW: 13.1 % (ref 11.5–15.5)
WBC Count: 3.1 10*3/uL — ABNORMAL LOW (ref 4.0–10.5)
nRBC: 0 % (ref 0.0–0.2)

## 2019-12-06 LAB — SAVE SMEAR(SSMR), FOR PROVIDER SLIDE REVIEW

## 2019-12-06 LAB — LACTATE DEHYDROGENASE: LDH: 217 U/L — ABNORMAL HIGH (ref 98–192)

## 2019-12-06 NOTE — Progress Notes (Signed)
[place] OFFICE PROGRESS NOTE  Patient Care Team: Vanessa Mccreedy, MD as PCP - General (Internal Medicine) Nicanor Alcon, MD (Thoracic Surgery) Terance Ice, MD (Inactive) (Cardiology)  HEME/ONC OVERVIEW: 1. Pancytopenia  -Normal CBC in 2019 except anemia  -12/2018: labs showed WBC 2.8k w/ 32% PMN (ANC 900) and 58% lymph's; plts 86k; Hgb between 10-12  Nutritional, infectious and coagulation studies unremarkable  PB flow cytometry, SPEP/IFE/free light chains negative   Abdominal US negative for liver disease   EGD/colonoscopy unremarkable in 02/2019 ([facility]) -On observation   PERTINENT NON-HEM/ONC PROBLEMS: 1. 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  There was no record on how severe the thrombocytopenia was at that time. -CBC reviewed today and showed stable pancytopenia (WBC 3.1k w/ ANC ~1000, Hgb 11.8 and plts 87k). -Clinically, patient denies any constitutional symptoms, symptoms of infection or bleeding -As her pancytopenia has remained stable for nearly a year, and there are no clinically suspicious symptoms at this time, I do not think bone marrow biopsy would change her management -I have ordered ANA and rheumatoid factor to screen for rheumatologic disease, as well as PNH panel to rule out paroxysmal nocturnal hemoglobinuria at the next visit  -However, if she develops progressive cytopenias or clinically suspicious symptoms, then bone marrow biopsy would be recommended -I counseled the patient on some of the concerning symptoms, including but not limited to, unexplained persistent fever (T-max > 100.4), drenching night sweats, unexplained weight loss, progressive lymphadenopathy, or unusual  bleeding/excessive bruising, for which she is instructed to seek care promptly  Orders Placed This Encounter  Procedures  . CBC with Differential (Cancer Center Only)    Standing Status:   Future    Standing Expiration Date:   01/09/2021  . CMP ([place] only)    Standing Status:   Future    Standing Expiration Date:   01/09/2021  . Save Smear (SSMR)    Standing Status:   Future    Standing Expiration Date:   12/05/2020  . Lactate dehydrogenase    Standing Status:   Future    Standing Expiration Date:   01/09/2021  . ANA, IFA (with reflex)    Standing Status:   Future    Standing Expiration Date:   12/05/2020  . Rheumatoid factor    Standing Status:   Future    Standing Expiration Date:   12/05/2020  . PNH Profile (High Sensitivity)    Standing Status:   Future    Standing Expiration Date:   12/05/2020   The total time spent in the encounter was 30 minutes, including face-to-face time with the patient, review of various tests results, order additional studies/medications, documentation, and coordination of care plan.   All questions were answered. The patient knows to call the clinic with any problems, questions or concerns. No barriers to learning was detected.  Return to clinic in 3 months for labs and clinic follow-up.  [name], MD 4/15/202111:27 AM  CHIEF COMPLAINT: "I am doing fine"  INTERVAL HISTORY: [name] returns clinic for follow-up of chronic pancytopenia.  Patient reports that she has been doing well since last visit, and denies any constitutional symptoms, or symptoms of infection or bleeding.  She has occasional small bruises on her arm  or leg, which she attributes to minor trauma, but she denies any unusual excessive bruising or symptoms of bleeding, such as spontaneous epistaxis, hematuria or hematochezia.  She denies any recent medication change.  She denies any other complaint today.  REVIEW OF SYSTEMS:   Constitutional: ( - ) fevers, ( - )  chills , ( -  ) night sweats Eyes: ( - ) blurriness of vision, ( - ) double vision, ( - ) watery eyes Ears, nose, mouth, throat, and face: ( - ) mucositis, ( - ) sore throat Respiratory: ( - ) cough, ( - ) dyspnea, ( - ) wheezes Cardiovascular: ( - ) palpitation, ( - ) chest discomfort, ( - ) lower extremity swelling Gastrointestinal:  ( - ) nausea, ( - ) heartburn, ( - ) change in bowel habits Skin: ( - ) abnormal skin rashes Lymphatics: ( - ) new lymphadenopathy, ( - ) easy bruising Neurological: ( - ) numbness, ( - ) tingling, ( - ) new weaknesses Behavioral/Psych: ( - ) mood change, ( - ) new changes  All other systems were reviewed with the patient and are negative.  SUMMARY OF ONCOLOGIC HISTORY: Oncology History   No history exists.    I have reviewed the past medical history, past surgical history, social history and family history with the patient and they are unchanged from previous note.  ALLERGIES:  is allergic to ampicillin.  MEDICATIONS:  Current Outpatient Medications  Medication Sig Dispense Refill  . amLODipine (NORVASC) 10 MG tablet Take 1 tablet (10 mg total) by mouth daily. 30 tablet 0  . aspirin 81 MG tablet Take 81 mg by mouth daily.      . Calcium Carbonate-Vitamin D (CALCIUM-VITAMIN D) 500-200 MG-UNIT per tablet Take 1 tablet by mouth 2 (two) times daily with a meal.      . diclofenac sodium (VOLTAREN) 1 % GEL APPLY 4GM TO AFFECTED JOINTS EVERY 6 HOURS.    . folic acid (FOLVITE) 1 MG tablet Take 1 tablet (1 mg total) by mouth daily. 30 tablet 11  . GAVILYTE-G 236 g solution See admin instructions.    Marland Kitchen glimepiride (AMARYL) 2 MG tablet Take 2 mg by mouth daily.    . hydrocortisone cream 1 % Apply topically 4 (four) times daily. Apply to itchy areas 30 g 0  . loratadine (CLARITIN) 10 MG tablet Take 1 tablet (10 mg total) by mouth daily. 14 tablet 0  . meloxicam (MOBIC) 15 MG tablet Take 15 mg by mouth daily.  0  . metFORMIN (GLUCOPHAGE) 500 MG tablet Take 500 mg by mouth  daily.   1  . methocarbamol (ROBAXIN) 500 MG tablet Take 500 mg by mouth 2 (two) times daily.  0  . metoprolol succinate (TOPROL-XL) 25 MG 24 hr tablet Take 12.5 mg by mouth daily.  0  . NARCAN 4 MG/0.1ML LIQD nasal spray kit     . omeprazole (PRILOSEC) 40 MG capsule TAKE 1 CAPSULE BY MOUTH ONCE DAILY FOR 90 DAYS    . ondansetron (ZOFRAN-ODT) 4 MG disintegrating tablet Take 1 tablet (4 mg total) by mouth every 8 (eight) hours as needed for nausea or vomiting. 20 tablet 2  . oxyCODONE-acetaminophen (PERCOCET) 10-325 MG tablet Take 1 tablet by mouth 3 (three) times daily.  0  . pregabalin (LYRICA) 75 MG capsule TAKE 1 CAPSULE BY MOUTH EVERY 6 HOURS *FILL 60 DAYS FROM 3/25    . PREMARIN vaginal cream INSERT 0.5 GRAMS VAGINALLY TWICE A WEEK FOR 4  WEEKS THEN AS NEEDED    . rosuvastatin (CRESTOR) 20 MG tablet Take 20 mg by mouth daily.  1  . Vitamin D, Ergocalciferol, (DRISDOL) 50000 units CAPS capsule TAKE 1 CAPSULE BY MOUTH TWICE A WEEK  3  . famotidine (PEPCID) 20 MG tablet Take 1 tablet (20 mg total) by mouth 2 (two) times daily for 7 days. 14 tablet 0   No current facility-administered medications for this visit.    PHYSICAL EXAMINATION: ECOG PERFORMANCE STATUS: 1 - Symptomatic but completely ambulatory  Today's Vitals   12/06/19 1055  BP: 132/82  Pulse: (!) 51  Resp: 18  Temp: (!) 97.1 F (36.2 C)  TempSrc: Temporal  SpO2: 100%  Weight: 174 lb (78.9 kg)  Height: '5\' 4"'$  (1.626 m)  PainSc: 0-No pain   Body mass index is 29.87 kg/m.  Filed Weights   12/06/19 1055  Weight: 174 lb (78.9 kg)    GENERAL: alert, no distress and comfortable, walks with a cane  SKIN: skin color, texture, turgor are normal, no rashes or significant lesions EYES: conjunctiva are pink and non-injected, sclera clear OROPHARYNX: no exudate, no erythema; lips, buccal mucosa, and tongue normal  NECK: supple, non-tender LYMPH:  no palpable lymphadenopathy in the cervical LUNGS: clear to auscultation with  normal breathing effort HEART: regular rate & rhythm and no murmurs and no lower extremity edema ABDOMEN: soft, non-tender, non-distended, normal bowel sounds Musculoskeletal: no cyanosis of digits and no clubbing  PSYCH: alert & oriented x 3, fluent speech  LABORATORY DATA:  I have reviewed the data as listed    Component Value Date/Time   NA 141 12/06/2019 1029   K 3.8 12/06/2019 1029   CL 105 12/06/2019 1029   CO2 30 12/06/2019 1029   GLUCOSE 104 (H) 12/06/2019 1029   BUN 14 12/06/2019 1029   CREATININE 0.98 12/06/2019 1029   CREATININE 1.49 (H) 04/19/2018 1652   CALCIUM 9.7 12/06/2019 1029   PROT 7.4 12/06/2019 1029   ALBUMIN 4.3 12/06/2019 1029   AST 17 12/06/2019 1029   ALT 11 12/06/2019 1029   ALKPHOS 65 12/06/2019 1029   BILITOT 0.5 12/06/2019 1029   GFRNONAA >60 12/06/2019 1029   GFRNONAA 40 (L) 04/19/2018 1652   GFRAA >60 12/06/2019 1029   GFRAA 47 (L) 04/19/2018 1652    No results found for: SPEP, UPEP  Lab Results  Component Value Date   WBC 3.1 (L) 12/06/2019   NEUTROABS 0.8 (L) 12/06/2019   HGB 11.8 (L) 12/06/2019   HCT 34.3 (L) 12/06/2019   MCV 82.9 12/06/2019   PLT 87 (L) 12/06/2019      Chemistry      Component Value Date/Time   NA 141 12/06/2019 1029   K 3.8 12/06/2019 1029   CL 105 12/06/2019 1029   CO2 30 12/06/2019 1029   BUN 14 12/06/2019 1029   CREATININE 0.98 12/06/2019 1029   CREATININE 1.49 (H) 04/19/2018 1652      Component Value Date/Time   CALCIUM 9.7 12/06/2019 1029   ALKPHOS 65 12/06/2019 1029   AST 17 12/06/2019 1029   ALT 11 12/06/2019 1029   BILITOT 0.5 12/06/2019 1029       RADIOGRAPHIC STUDIES: I have personally reviewed the radiological images as listed below and agreed with the findings in the report. No results found.

## 2019-12-06 NOTE — Telephone Encounter (Signed)
Appointments scheduled calendar printed per 4/15 los

## 2019-12-10 LAB — HGB SOLUBILITY: Hgb Solubility: POSITIVE — AB

## 2019-12-10 LAB — HGB FRACTIONATION CASCADE
Hgb A2: 2.8 % (ref 1.8–3.2)
Hgb A: 56.4 % — ABNORMAL LOW (ref 96.4–98.8)
Hgb F: 0 % (ref 0.0–2.0)
Hgb S: 40.8 % — ABNORMAL HIGH

## 2019-12-28 DIAGNOSIS — Z1389 Encounter for screening for other disorder: Secondary | ICD-10-CM | POA: Diagnosis not present

## 2019-12-28 DIAGNOSIS — Z1321 Encounter for screening for nutritional disorder: Secondary | ICD-10-CM | POA: Diagnosis not present

## 2019-12-28 DIAGNOSIS — Z1329 Encounter for screening for other suspected endocrine disorder: Secondary | ICD-10-CM | POA: Diagnosis not present

## 2019-12-28 DIAGNOSIS — I10 Essential (primary) hypertension: Secondary | ICD-10-CM | POA: Diagnosis not present

## 2019-12-28 DIAGNOSIS — E782 Mixed hyperlipidemia: Secondary | ICD-10-CM | POA: Diagnosis not present

## 2019-12-28 DIAGNOSIS — E559 Vitamin D deficiency, unspecified: Secondary | ICD-10-CM | POA: Diagnosis not present

## 2019-12-28 DIAGNOSIS — E1165 Type 2 diabetes mellitus with hyperglycemia: Secondary | ICD-10-CM | POA: Diagnosis not present

## 2019-12-28 DIAGNOSIS — Z01118 Encounter for examination of ears and hearing with other abnormal findings: Secondary | ICD-10-CM | POA: Diagnosis not present

## 2019-12-28 DIAGNOSIS — Z0001 Encounter for general adult medical examination with abnormal findings: Secondary | ICD-10-CM | POA: Diagnosis not present

## 2019-12-28 DIAGNOSIS — Z136 Encounter for screening for cardiovascular disorders: Secondary | ICD-10-CM | POA: Diagnosis not present

## 2019-12-28 DIAGNOSIS — K219 Gastro-esophageal reflux disease without esophagitis: Secondary | ICD-10-CM | POA: Diagnosis not present

## 2019-12-28 DIAGNOSIS — K5909 Other constipation: Secondary | ICD-10-CM | POA: Diagnosis not present

## 2020-01-22 ENCOUNTER — Ambulatory Visit
Admission: RE | Admit: 2020-01-22 | Discharge: 2020-01-22 | Disposition: A | Discharge status: Home / Self Care | Payer: Federal, State, Local not specified - PPO | Source: Ambulatory Visit | Attending: Anesthesiology | Admitting: Anesthesiology

## 2020-01-22 ENCOUNTER — Other Ambulatory Visit: Payer: Self-pay | Admitting: Anesthesiology

## 2020-01-22 ENCOUNTER — Other Ambulatory Visit: Payer: Self-pay

## 2020-01-22 DIAGNOSIS — M5137 Other intervertebral disc degeneration, lumbosacral region: Secondary | ICD-10-CM | POA: Diagnosis not present

## 2020-01-22 DIAGNOSIS — M4628 Osteomyelitis of vertebra, sacral and sacrococcygeal region: Secondary | ICD-10-CM | POA: Diagnosis not present

## 2020-01-22 DIAGNOSIS — M1711 Unilateral primary osteoarthritis, right knee: Secondary | ICD-10-CM

## 2020-01-22 DIAGNOSIS — M542 Cervicalgia: Secondary | ICD-10-CM | POA: Diagnosis not present

## 2020-01-22 DIAGNOSIS — M545 Low back pain: Secondary | ICD-10-CM | POA: Diagnosis not present

## 2020-01-22 DIAGNOSIS — G890 Central pain syndrome: Secondary | ICD-10-CM | POA: Diagnosis not present

## 2020-01-22 DIAGNOSIS — M7610 Psoas tendinitis, unspecified hip: Secondary | ICD-10-CM | POA: Diagnosis not present

## 2020-01-30 DIAGNOSIS — H1132 Conjunctival hemorrhage, left eye: Secondary | ICD-10-CM | POA: Diagnosis not present

## 2020-02-01 DIAGNOSIS — E782 Mixed hyperlipidemia: Secondary | ICD-10-CM | POA: Diagnosis not present

## 2020-02-01 DIAGNOSIS — E1165 Type 2 diabetes mellitus with hyperglycemia: Secondary | ICD-10-CM | POA: Diagnosis not present

## 2020-02-01 DIAGNOSIS — K5909 Other constipation: Secondary | ICD-10-CM | POA: Diagnosis not present

## 2020-02-01 DIAGNOSIS — K219 Gastro-esophageal reflux disease without esophagitis: Secondary | ICD-10-CM | POA: Diagnosis not present

## 2020-02-01 DIAGNOSIS — E559 Vitamin D deficiency, unspecified: Secondary | ICD-10-CM | POA: Diagnosis not present

## 2020-03-06 ENCOUNTER — Telehealth: Payer: Self-pay | Admitting: Family

## 2020-03-06 ENCOUNTER — Inpatient Hospital Stay: Payer: Medicare Other | Attending: Hematology

## 2020-03-06 ENCOUNTER — Inpatient Hospital Stay (HOSPITAL_BASED_OUTPATIENT_CLINIC_OR_DEPARTMENT_OTHER): Payer: Medicare Other | Admitting: Family

## 2020-03-06 ENCOUNTER — Encounter: Payer: Self-pay | Admitting: Family

## 2020-03-06 VITALS — BP 129/80 | HR 50 | Temp 98.8°F | Resp 16 | Ht 64.0 in | Wt 179.4 lb

## 2020-03-06 DIAGNOSIS — D61818 Other pancytopenia: Secondary | ICD-10-CM

## 2020-03-06 DIAGNOSIS — D573 Sickle-cell trait: Secondary | ICD-10-CM

## 2020-03-06 LAB — CBC WITH DIFFERENTIAL (CANCER CENTER ONLY)
Abs Immature Granulocytes: 0 10*3/uL (ref 0.00–0.07)
Basophils Absolute: 0 10*3/uL (ref 0.0–0.1)
Basophils Relative: 0 %
Eosinophils Absolute: 0 10*3/uL (ref 0.0–0.5)
Eosinophils Relative: 1 %
HCT: 34.9 % — ABNORMAL LOW (ref 36.0–46.0)
Hemoglobin: 11.9 g/dL — ABNORMAL LOW (ref 12.0–15.0)
Immature Granulocytes: 0 %
Lymphocytes Relative: 49 %
Lymphs Abs: 1.6 10*3/uL (ref 0.7–4.0)
MCH: 29.4 pg (ref 26.0–34.0)
MCHC: 34.1 g/dL (ref 30.0–36.0)
MCV: 86.2 fL (ref 80.0–100.0)
Monocytes Absolute: 0.2 10*3/uL (ref 0.1–1.0)
Monocytes Relative: 7 %
Neutro Abs: 1.4 10*3/uL — ABNORMAL LOW (ref 1.7–7.7)
Neutrophils Relative %: 43 %
Platelet Count: 72 10*3/uL — ABNORMAL LOW (ref 150–400)
RBC: 4.05 MIL/uL (ref 3.87–5.11)
RDW: 14.1 % (ref 11.5–15.5)
WBC Count: 3.3 10*3/uL — ABNORMAL LOW (ref 4.0–10.5)
nRBC: 0 % (ref 0.0–0.2)

## 2020-03-06 LAB — CMP (CANCER CENTER ONLY)
ALT: 10 U/L (ref 0–44)
AST: 15 U/L (ref 15–41)
Albumin: 4.5 g/dL (ref 3.5–5.0)
Alkaline Phosphatase: 56 U/L (ref 38–126)
Anion gap: 6 (ref 5–15)
BUN: 12 mg/dL (ref 6–20)
CO2: 29 mmol/L (ref 22–32)
Calcium: 9.8 mg/dL (ref 8.9–10.3)
Chloride: 108 mmol/L (ref 98–111)
Creatinine: 1.15 mg/dL — ABNORMAL HIGH (ref 0.44–1.00)
GFR, Est AFR Am: >60 mL/min (ref >60)
GFR, Estimated: 54 mL/min — ABNORMAL LOW (ref >60)
Glucose, Bld: 127 mg/dL — ABNORMAL HIGH (ref 70–99)
Potassium: 4.4 mmol/L (ref 3.5–5.1)
Sodium: 143 mmol/L (ref 135–145)
Total Bilirubin: 0.6 mg/dL (ref 0.3–1.2)
Total Protein: 7.6 g/dL (ref 6.5–8.1)

## 2020-03-06 LAB — LACTATE DEHYDROGENASE: LDH: 257 U/L — ABNORMAL HIGH (ref 98–192)

## 2020-03-06 LAB — SAVE SMEAR(SSMR), FOR PROVIDER SLIDE REVIEW

## 2020-03-06 NOTE — Progress Notes (Signed)
Hematology and Oncology Follow Up Visit  [name] Feb 17, 1966 54 y.o. 03/06/2020   Principle Diagnosis:  Pancytopenia   Current Therapy:   Observation    Interim History:  [name] is here today for follow-up. She is doing well and has no complaints at this time.  Hgb is stable at 11.9, WBC count 3.3 and platelets 72.  She has had no issues with bleeding. No abnormal bruising. No petechiae.  No issues with infections. No fever, chills, n/v, cough, rash, dizziness, SOB, chest pain, abdominal pain or changes in bowel or bladder habits.  No swelling or tenderness in her extremities.  She has numbness and tingling in her hands and feet which she attributes to chronic neck and back issues.  No falls or syncopal episodes to report.  She states that she has a good appetite and is staying well hydrated. Her weight is stable.   ECOG Performance Status: 0 - Asymptomatic  Medications:  Allergies as of 03/06/2020      Reactions   Ampicillin Hives, Itching   Has patient had a PCN reaction causing immediate rash, facial/tongue/throat swelling, SOB or lightheadedness with hypotension: Y Has patient had a PCN reaction causing severe rash involving mucus membranes or skin necrosis: Y Has patient had a PCN reaction that required hospitalization: Y Has patient had a PCN reaction occurring within the last 10 years: Y If all of the above answers are "NO", then may proceed with Cephalosporin use.      Medication List       Accurate as of March 06, 2020 10:55 AM. If you have any questions, ask your nurse or doctor.        amLODipine 10 MG tablet Commonly known as: NORVASC Take 1 tablet (10 mg total) by mouth daily.   aspirin 81 MG tablet Take 81 mg by mouth daily.   calcium-vitamin D 500-200 MG-UNIT tablet Take 1 tablet by mouth 2 (two) times daily with a meal.   diclofenac sodium 1 % Gel Commonly known as: VOLTAREN APPLY 4GM TO AFFECTED JOINTS EVERY 6 HOURS.     famotidine 20 MG tablet Commonly known as: PEPCID Take 1 tablet (20 mg total) by mouth 2 (two) times daily for 7 days.   folic acid 1 MG tablet Commonly known as: FOLVITE Take 1 tablet (1 mg total) by mouth daily.   GaviLyte-G 236 g solution Generic drug: polyethylene glycol See admin instructions.   glimepiride 2 MG tablet Commonly known as: AMARYL Take 2 mg by mouth daily.   hydrocortisone cream 1 % Apply topically 4 (four) times daily. Apply to itchy areas   loratadine 10 MG tablet Commonly known as: CLARITIN Take 1 tablet (10 mg total) by mouth daily.   meloxicam 15 MG tablet Commonly known as: MOBIC Take 15 mg by mouth daily.   metFORMIN 500 MG tablet Commonly known as: GLUCOPHAGE Take 500 mg by mouth daily.   methocarbamol 500 MG tablet Commonly known as: ROBAXIN Take 500 mg by mouth 2 (two) times daily.   metoprolol succinate 25 MG 24 hr tablet Commonly known as: TOPROL-XL Take 12.5 mg by mouth daily.   Narcan 4 MG/0.1ML Liqd nasal spray kit Generic drug: naloxone   norethindrone-ethinyl estradiol 1-5 MG-MCG Tabs tablet Commonly known as: FEMHRT 1/5 Take 1 tablet by mouth daily.   omeprazole 40 MG capsule Commonly known as: PRILOSEC TAKE 1 CAPSULE BY MOUTH ONCE DAILY FOR 90 DAYS   ondansetron 4 MG disintegrating tablet Commonly known as:  ZOFRAN-ODT Take 1 tablet (4 mg total) by mouth every 8 (eight) hours as needed for nausea or vomiting.   oxyCODONE-acetaminophen 10-325 MG tablet Commonly known as: PERCOCET Take 1 tablet by mouth 3 (three) times daily.   pregabalin 75 MG capsule Commonly known as: LYRICA TAKE 1 CAPSULE BY MOUTH EVERY 6 HOURS *FILL 60 DAYS FROM 3/25   Premarin vaginal cream Generic drug: conjugated estrogens INSERT 0.5 GRAMS VAGINALLY TWICE A WEEK FOR 4 WEEKS THEN AS NEEDED   rosuvastatin 20 MG tablet Commonly known as: CRESTOR Take 20 mg by mouth daily.   Vitamin D (Ergocalciferol) 1.25 MG (50000 UNIT) Caps  capsule Commonly known as: DRISDOL TAKE 1 CAPSULE BY MOUTH TWICE A WEEK       Allergies:  Allergies  Allergen Reactions   Ampicillin Hives and Itching    Has patient had a PCN reaction causing immediate rash, facial/tongue/throat swelling, SOB or lightheadedness with hypotension: Y Has patient had a PCN reaction causing severe rash involving mucus membranes or skin necrosis: Y Has patient had a PCN reaction that required hospitalization: Y Has patient had a PCN reaction occurring within the last 10 years: Y If all of the above answers are "NO", then may proceed with Cephalosporin use.     Past Medical History, Surgical history, Social history, and Family History were reviewed and updated.  Review of Systems: All other 10 point review of systems is negative.   Physical Exam:  height is 5' 4" (1.626 m) and weight is 179 lb 6.4 oz (81.4 kg). Her oral temperature is 98.8 F (37.1 C). Her blood pressure is 129/80 and her pulse is 50 (abnormal). Her respiration is 16 and oxygen saturation is 100%.   Wt Readings from Last 3 Encounters:  03/06/20 179 lb 6.4 oz (81.4 kg)  12/06/19 174 lb (78.9 kg)  08/10/19 173 lb 12.8 oz (78.8 kg)    Ocular: Sclerae unicteric, pupils equal, round and reactive to light Ear-nose-throat: Oropharynx clear, dentition fair Lymphatic: No cervical or supraclavicular adenopathy Lungs no rales or rhonchi, good excursion bilaterally Heart regular rate and rhythm, no murmur appreciated Abd soft, nontender, positive bowel sounds, no liver or spleen tip palpated on exam, no fluid wave  MSK no focal spinal tenderness, no joint edema Neuro: non-focal, well-oriented, appropriate affect Breasts: Deferred   Lab Results  Component Value Date   WBC 3.3 (L) 03/06/2020   HGB 11.9 (L) 03/06/2020   HCT 34.9 (L) 03/06/2020   MCV 86.2 03/06/2020   PLT 72 (L) 03/06/2020   Lab Results  Component Value Date   FERRITIN 45 02/02/2019   IRON 84 02/02/2019   TIBC 311  02/02/2019   UIBC 227 02/02/2019   IRONPCTSAT 27 02/02/2019   Lab Results  Component Value Date   RETICCTPCT 1.0 01/27/2018   RBC 4.05 03/06/2020   Lab Results  Component Value Date   KPAFRELGTCHN 25.5 (H) 03/02/2019   LAMBDASER 16.4 03/02/2019   KAPLAMBRATIO 1.55 03/02/2019   Lab Results  Component Value Date   IGGSERUM 1,683 (H) 03/02/2019   IGA 232 03/02/2019   IGMSERUM 123 03/02/2019   Lab Results  Component Value Date   TOTALPROTELP 7.0 03/02/2019     Chemistry      Component Value Date/Time   NA 141 12/06/2019 1029   K 3.8 12/06/2019 1029   CL 105 12/06/2019 1029   CO2 30 12/06/2019 1029   BUN 14 12/06/2019 1029   CREATININE 0.98 12/06/2019 1029   CREATININE 1.49 (H)  04/19/2018 1652      Component Value Date/Time   CALCIUM 9.7 12/06/2019 1029   ALKPHOS 65 12/06/2019 1029   AST 17 12/06/2019 1029   ALT 11 12/06/2019 1029   BILITOT 0.5 12/06/2019 1029       Impression and Plan: Ms. Picazo is a very pleasant 54 yo female with pancytopenia.  She had extensive lab work done today and results are pending.  Her counts remain stable at this time and she is asymptomatic.  We will go ahead and plan to see her back in another 3 months.  She will contact our office with any questions or concerns. We can certainly see her sooner if needed.   Laverna Peace, NP 7/15/202110:55 AM

## 2020-03-06 NOTE — Telephone Encounter (Signed)
Appointments scheduled calendar printed & mailed per 7/15 los 

## 2020-03-07 LAB — ANTINUCLEAR ANTIBODIES, IFA: ANA Ab, IFA: NEGATIVE

## 2020-03-07 LAB — RHEUMATOID FACTOR: Rheumatoid fact SerPl-aCnc: <10 IU/mL (ref 0.0–13.9)

## 2020-03-10 LAB — PNH PROFILE (-HIGH SENSITIVITY)

## 2020-03-13 DIAGNOSIS — H40023 Open angle with borderline findings, high risk, bilateral: Secondary | ICD-10-CM | POA: Diagnosis not present

## 2020-03-13 DIAGNOSIS — E119 Type 2 diabetes mellitus without complications: Secondary | ICD-10-CM | POA: Diagnosis not present

## 2020-04-15 DIAGNOSIS — M4628 Osteomyelitis of vertebra, sacral and sacrococcygeal region: Secondary | ICD-10-CM | POA: Diagnosis not present

## 2020-04-15 DIAGNOSIS — M5137 Other intervertebral disc degeneration, lumbosacral region: Secondary | ICD-10-CM | POA: Diagnosis not present

## 2020-04-15 DIAGNOSIS — M7610 Psoas tendinitis, unspecified hip: Secondary | ICD-10-CM | POA: Diagnosis not present

## 2020-04-15 DIAGNOSIS — G890 Central pain syndrome: Secondary | ICD-10-CM | POA: Diagnosis not present

## 2020-04-15 DIAGNOSIS — M545 Low back pain: Secondary | ICD-10-CM | POA: Diagnosis not present

## 2020-04-15 DIAGNOSIS — M542 Cervicalgia: Secondary | ICD-10-CM | POA: Diagnosis not present

## 2020-05-16 DIAGNOSIS — Z1231 Encounter for screening mammogram for malignant neoplasm of breast: Secondary | ICD-10-CM | POA: Diagnosis not present

## 2020-06-05 ENCOUNTER — Inpatient Hospital Stay (HOSPITAL_BASED_OUTPATIENT_CLINIC_OR_DEPARTMENT_OTHER): Payer: Medicare Other | Admitting: Family

## 2020-06-05 ENCOUNTER — Other Ambulatory Visit: Payer: Self-pay

## 2020-06-05 ENCOUNTER — Ambulatory Visit: Payer: Federal, State, Local not specified - PPO | Admitting: Hematology & Oncology

## 2020-06-05 ENCOUNTER — Encounter: Payer: Self-pay | Admitting: Family

## 2020-06-05 ENCOUNTER — Inpatient Hospital Stay: Payer: Medicare Other | Attending: Hematology

## 2020-06-05 VITALS — BP 145/80 | HR 54 | Temp 98.1°F | Resp 19 | Ht 64.0 in | Wt 177.1 lb

## 2020-06-05 DIAGNOSIS — Z7982 Long term (current) use of aspirin: Secondary | ICD-10-CM | POA: Insufficient documentation

## 2020-06-05 DIAGNOSIS — Z79899 Other long term (current) drug therapy: Secondary | ICD-10-CM | POA: Insufficient documentation

## 2020-06-05 DIAGNOSIS — D573 Sickle-cell trait: Secondary | ICD-10-CM

## 2020-06-05 DIAGNOSIS — D61818 Other pancytopenia: Secondary | ICD-10-CM

## 2020-06-05 LAB — CMP (CANCER CENTER ONLY)
ALT: 10 U/L (ref 0–44)
AST: 17 U/L (ref 15–41)
Albumin: 4.5 g/dL (ref 3.5–5.0)
Alkaline Phosphatase: 48 U/L (ref 38–126)
Anion gap: 6 (ref 5–15)
BUN: 19 mg/dL (ref 6–20)
CO2: 32 mmol/L (ref 22–32)
Calcium: 10.3 mg/dL (ref 8.9–10.3)
Chloride: 104 mmol/L (ref 98–111)
Creatinine: 1.11 mg/dL — ABNORMAL HIGH (ref 0.44–1.00)
GFR, Estimated: 57 mL/min — ABNORMAL LOW (ref >60)
Glucose, Bld: 99 mg/dL (ref 70–99)
Potassium: 4.3 mmol/L (ref 3.5–5.1)
Sodium: 142 mmol/L (ref 135–145)
Total Bilirubin: 0.5 mg/dL (ref 0.3–1.2)
Total Protein: 7.9 g/dL (ref 6.5–8.1)

## 2020-06-05 LAB — CBC WITH DIFFERENTIAL (CANCER CENTER ONLY)
Abs Immature Granulocytes: 0 10*3/uL (ref 0.00–0.07)
Band Neutrophils: 0 %
Basophils Absolute: 0 10*3/uL (ref 0.0–0.1)
Basophils Relative: 0 %
Blasts: 0 %
Eosinophils Absolute: 0 10*3/uL (ref 0.0–0.5)
Eosinophils Relative: 0 %
HCT: 37.5 % (ref 36.0–46.0)
Hemoglobin: 12.7 g/dL (ref 12.0–15.0)
Lymphocytes Relative: 49 %
Lymphs Abs: 2.1 10*3/uL (ref 0.7–4.0)
MCH: 28.9 pg (ref 26.0–34.0)
MCHC: 33.9 g/dL (ref 30.0–36.0)
MCV: 85.2 fL (ref 80.0–100.0)
Metamyelocytes Relative: 0 %
Monocytes Absolute: 0.3 10*3/uL (ref 0.1–1.0)
Monocytes Relative: 6 %
Myelocytes: 0 %
Neutro Abs: 1.9 10*3/uL (ref 1.7–7.7)
Neutrophils Relative %: 45 %
Other: 0 %
Platelet Count: 92 10*3/uL — ABNORMAL LOW (ref 150–400)
Promyelocytes Relative: 0 %
RBC: 4.4 MIL/uL (ref 3.87–5.11)
RDW: 13.1 % (ref 11.5–15.5)
WBC Count: 4.3 10*3/uL (ref 4.0–10.5)
nRBC: 0 % (ref 0.0–0.2)
nRBC: 0 /100 WBC

## 2020-06-05 LAB — SAVE SMEAR(SSMR), FOR PROVIDER SLIDE REVIEW

## 2020-06-05 LAB — LACTATE DEHYDROGENASE: LDH: 232 U/L — ABNORMAL HIGH (ref 98–192)

## 2020-06-05 NOTE — Progress Notes (Addendum)
Hematology and Oncology Follow Up Visit  [name] [id number] [birth year] 54 y.o. 06/05/2020   Principle Diagnosis:  Pancytopenia  Sickle cell trait  Current Therapy:        Observation  Folic acid 1 mg PO daily   Interim History:  [name] is here today for follow-up. She is doing well and has no complaints at this time.  Her counts are stable to improved! Platelets are 92, WBC count 4.3 and Hgb 12.7.  She denies fatigue.  No issues with abnormal bleeding, bruising or petechiae.  No fever, chills, n/v, cough, rash, dizziness, SOB, chest pain, palpitations, abdominal pain or changes in bowel or bladder habits.  No swelling, tenderness in her extremities.  She states that she has intermittent numbness and tingling in the hands and right foot due to chronic back issues.  No falls or syncope.  She has maintained a good appetite and is staying well hydrated. Her weight is stable.  She enjoys walking each day for exercise.   ECOG Performance Status: 1 - Symptomatic but completely ambulatory  Medications:  Allergies as of 06/05/2020      Reactions   Ampicillin Hives, Itching   Has patient had a PCN reaction causing immediate rash, facial/tongue/throat swelling, SOB or lightheadedness with hypotension: Y Has patient had a PCN reaction causing severe rash involving mucus membranes or skin necrosis: Y Has patient had a PCN reaction that required hospitalization: Y Has patient had a PCN reaction occurring within the last 10 years: Y If all of the above answers are "NO", then may proceed with Cephalosporin use.      Medication List       Accurate as of June 05, 2020 10:27 AM. If you have any questions, ask your nurse or doctor.        amLODipine 10 MG tablet Commonly known as: NORVASC Take 1 tablet (10 mg total) by mouth daily.   aspirin 81 MG tablet Take 81 mg by mouth daily.   calcium-vitamin D 500-200 MG-UNIT tablet Take 1 tablet by mouth 2 (two) times  daily with a meal.   diclofenac sodium 1 % Gel Commonly known as: VOLTAREN APPLY 4GM TO AFFECTED JOINTS EVERY 6 HOURS.   famotidine 20 MG tablet Commonly known as: PEPCID Take 1 tablet (20 mg total) by mouth 2 (two) times daily for 7 days.   folic acid 1 MG tablet Commonly known as: FOLVITE Take 1 tablet (1 mg total) by mouth daily.   GaviLyte-G 236 g solution Generic drug: polyethylene glycol See admin instructions.   glimepiride 2 MG tablet Commonly known as: AMARYL Take 2 mg by mouth daily.   hydrocortisone cream 1 % Apply topically 4 (four) times daily. Apply to itchy areas   loratadine 10 MG tablet Commonly known as: CLARITIN Take 1 tablet (10 mg total) by mouth daily.   meloxicam 15 MG tablet Commonly known as: MOBIC Take 15 mg by mouth daily.   metFORMIN 500 MG tablet Commonly known as: GLUCOPHAGE Take 500 mg by mouth daily.   methocarbamol 500 MG tablet Commonly known as: ROBAXIN Take 500 mg by mouth 2 (two) times daily.   metoprolol succinate 25 MG 24 hr tablet Commonly known as: TOPROL-XL Take 12.5 mg by mouth daily.   Narcan 4 MG/0.1ML Liqd nasal spray kit Generic drug: naloxone   norethindrone-ethinyl estradiol 1-5 MG-MCG Tabs tablet Commonly known as: FEMHRT 1/5 Take 1 tablet by mouth daily.   omeprazole 40 MG capsule Commonly known as: PRILOSEC TAKE  1 CAPSULE BY MOUTH ONCE DAILY FOR 90 DAYS   ondansetron 4 MG disintegrating tablet Commonly known as: ZOFRAN-ODT Take 1 tablet (4 mg total) by mouth every 8 (eight) hours as needed for nausea or vomiting.   oxyCODONE-acetaminophen 10-325 MG tablet Commonly known as: PERCOCET Take 1 tablet by mouth 3 (three) times daily.   pregabalin 75 MG capsule Commonly known as: LYRICA TAKE 1 CAPSULE BY MOUTH EVERY 6 HOURS *FILL 60 DAYS FROM 3/25   Premarin vaginal cream Generic drug: conjugated estrogens INSERT 0.5 GRAMS VAGINALLY TWICE A WEEK FOR 4 WEEKS THEN AS NEEDED   rosuvastatin 20 MG  tablet Commonly known as: CRESTOR Take 20 mg by mouth daily.   Vitamin D (Ergocalciferol) 1.25 MG (50000 UNIT) Caps capsule Commonly known as: DRISDOL TAKE 1 CAPSULE BY MOUTH TWICE A WEEK       Allergies:  Allergies  Allergen Reactions  . Ampicillin Hives and Itching    Has patient had a PCN reaction causing immediate rash, facial/tongue/throat swelling, SOB or lightheadedness with hypotension: Y Has patient had a PCN reaction causing severe rash involving mucus membranes or skin necrosis: Y Has patient had a PCN reaction that required hospitalization: Y Has patient had a PCN reaction occurring within the last 10 years: Y If all of the above answers are "NO", then may proceed with Cephalosporin use.     Past Medical History, Surgical history, Social history, and Family History were reviewed and updated.  Review of Systems: All other 10 point review of systems is negative.   Physical Exam:  height is 5\' 4"  (1.626 m) and weight is 177 lb 1.3 oz (80.3 kg). Her oral temperature is 98.1 F (36.7 C). Her blood pressure is 145/80 (abnormal) and her pulse is 54 (abnormal). Her respiration is 19 and oxygen saturation is 100%.   Wt Readings from Last 3 Encounters:  06/05/20 177 lb 1.3 oz (80.3 kg)  03/06/20 179 lb 6.4 oz (81.4 kg)  12/06/19 174 lb (78.9 kg)    Ocular: Sclerae unicteric, pupils equal, round and reactive to light Ear-nose-throat: Oropharynx clear, dentition fair Lymphatic: No cervical or supraclavicular adenopathy Lungs no rales or rhonchi, good excursion bilaterally Heart regular rate and rhythm, no murmur appreciated Abd soft, nontender, positive bowel sounds, no liver or spleen tip palpated on exam, no fluid wave  MSK no focal spinal tenderness, no joint edema Neuro: non-focal, well-oriented, appropriate affect Breasts: Deferred   Lab Results  Component Value Date   WBC 4.3 06/05/2020   HGB 12.7 06/05/2020   HCT 37.5 06/05/2020   MCV 85.2 06/05/2020   PLT  92 (L) 06/05/2020   Lab Results  Component Value Date   FERRITIN 45 02/02/2019   IRON 84 02/02/2019   TIBC 311 02/02/2019   UIBC 227 02/02/2019   IRONPCTSAT 27 02/02/2019   Lab Results  Component Value Date   RETICCTPCT 1.0 01/27/2018   RBC 4.40 06/05/2020   Lab Results  Component Value Date   KPAFRELGTCHN 25.5 (H) 03/02/2019   LAMBDASER 16.4 03/02/2019   KAPLAMBRATIO 1.55 03/02/2019   Lab Results  Component Value Date   IGGSERUM 1,683 (H) 03/02/2019   IGA 232 03/02/2019   IGMSERUM 123 03/02/2019   Lab Results  Component Value Date   TOTALPROTELP 7.0 03/02/2019     Chemistry      Component Value Date/Time   NA 142 06/05/2020 0934   K 4.3 06/05/2020 0934   CL 104 06/05/2020 0934   CO2 32 06/05/2020 0934  BUN 19 06/05/2020 0934   CREATININE 1.11 (H) 06/05/2020 0934   CREATININE 1.49 (H) 04/19/2018 1652      Component Value Date/Time   CALCIUM 10.3 06/05/2020 0934   ALKPHOS 48 06/05/2020 0934   AST 17 06/05/2020 0934   ALT 10 06/05/2020 0934   BILITOT 0.5 06/05/2020 0934       Impression and Plan: Ms. Dorff is a very pleasant 54 yo female with pancytopenia and the sickle cell trait.  She is doing well and has counts are stable at this time.  She will be travelling back to [place] in November to help care for her mother who's health is failing.  She states that she will contact our office when she returns. At this time she does not know when that will be.  She will contact our office with any questions or concerns.   Laverna Peace, NP 10/14/202110:27 AM

## 2020-06-06 ENCOUNTER — Other Ambulatory Visit: Payer: Federal, State, Local not specified - PPO

## 2020-06-06 ENCOUNTER — Ambulatory Visit: Payer: Federal, State, Local not specified - PPO | Admitting: Family

## 2020-06-19 DIAGNOSIS — Z23 Encounter for immunization: Secondary | ICD-10-CM | POA: Diagnosis not present

## 2020-06-19 DIAGNOSIS — K219 Gastro-esophageal reflux disease without esophagitis: Secondary | ICD-10-CM | POA: Diagnosis not present

## 2020-06-19 DIAGNOSIS — E1165 Type 2 diabetes mellitus with hyperglycemia: Secondary | ICD-10-CM | POA: Diagnosis not present

## 2020-06-19 DIAGNOSIS — E782 Mixed hyperlipidemia: Secondary | ICD-10-CM | POA: Diagnosis not present

## 2020-06-19 DIAGNOSIS — E559 Vitamin D deficiency, unspecified: Secondary | ICD-10-CM | POA: Diagnosis not present

## 2020-06-19 DIAGNOSIS — D696 Thrombocytopenia, unspecified: Secondary | ICD-10-CM | POA: Diagnosis not present

## 2020-06-26 DIAGNOSIS — M25561 Pain in right knee: Secondary | ICD-10-CM | POA: Diagnosis not present

## 2020-06-26 DIAGNOSIS — M545 Low back pain, unspecified: Secondary | ICD-10-CM | POA: Diagnosis not present

## 2020-06-26 DIAGNOSIS — M5137 Other intervertebral disc degeneration, lumbosacral region: Secondary | ICD-10-CM | POA: Diagnosis not present

## 2020-06-26 DIAGNOSIS — M542 Cervicalgia: Secondary | ICD-10-CM | POA: Diagnosis not present

## 2020-06-26 DIAGNOSIS — M4628 Osteomyelitis of vertebra, sacral and sacrococcygeal region: Secondary | ICD-10-CM | POA: Diagnosis not present

## 2020-06-26 DIAGNOSIS — M7610 Psoas tendinitis, unspecified hip: Secondary | ICD-10-CM | POA: Diagnosis not present

## 2020-06-26 DIAGNOSIS — G890 Central pain syndrome: Secondary | ICD-10-CM | POA: Diagnosis not present

## 2020-07-15 DIAGNOSIS — D696 Thrombocytopenia, unspecified: Secondary | ICD-10-CM | POA: Diagnosis not present

## 2020-07-15 DIAGNOSIS — R300 Dysuria: Secondary | ICD-10-CM | POA: Diagnosis not present

## 2020-07-15 DIAGNOSIS — K219 Gastro-esophageal reflux disease without esophagitis: Secondary | ICD-10-CM | POA: Diagnosis not present

## 2020-07-15 DIAGNOSIS — E1165 Type 2 diabetes mellitus with hyperglycemia: Secondary | ICD-10-CM | POA: Diagnosis not present

## 2020-07-15 DIAGNOSIS — E782 Mixed hyperlipidemia: Secondary | ICD-10-CM | POA: Diagnosis not present

## 2020-07-15 DIAGNOSIS — E559 Vitamin D deficiency, unspecified: Secondary | ICD-10-CM | POA: Diagnosis not present

## 2020-07-16 ENCOUNTER — Other Ambulatory Visit: Payer: Self-pay | Admitting: Nephrology

## 2020-07-16 DIAGNOSIS — M545 Low back pain, unspecified: Secondary | ICD-10-CM

## 2020-07-18 DIAGNOSIS — Z20822 Contact with and (suspected) exposure to covid-19: Secondary | ICD-10-CM | POA: Diagnosis not present

## 2021-03-25 IMAGING — US ULTRASOUND ABDOMEN COMPLETE
1 series · 14 of 25 positions shown · non-contrast
Comparison: Limited correlation made with chest CT 06/27/2014.

CLINICAL DATA: Thrombocytopenia. Evaluate for liver disease.
History of diabetes and hypertension.

EXAM:
ABDOMEN ULTRASOUND COMPLETE

[Series 1: ultrasound abdomen complete · 14 of 113 slices shown]
[im 1/113]
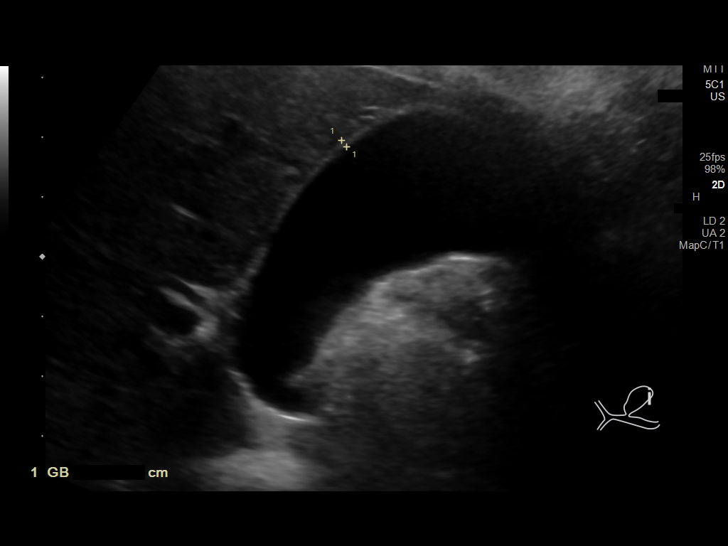
[im 10/113]
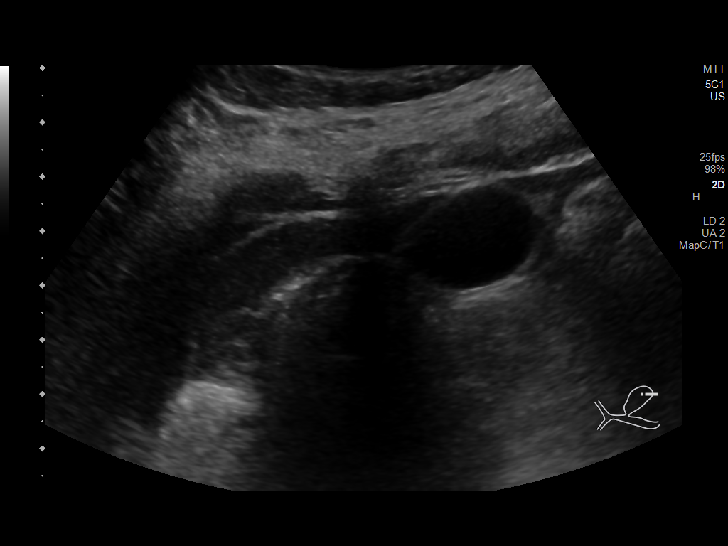
[im 19/113]
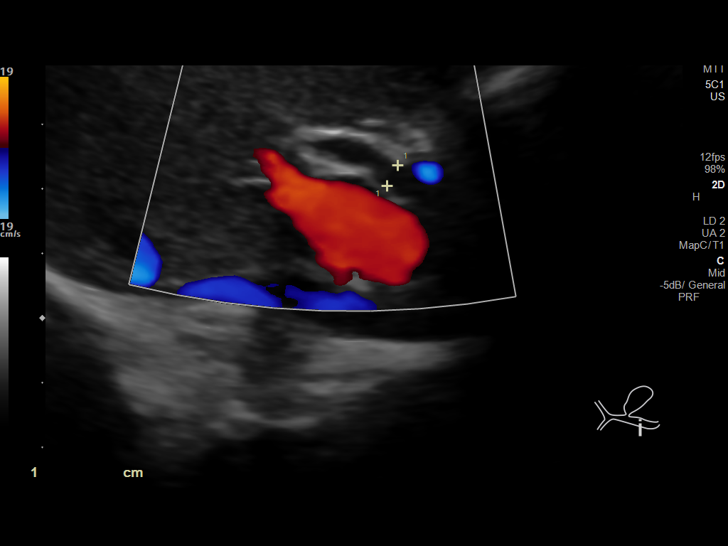
[im 29/113]
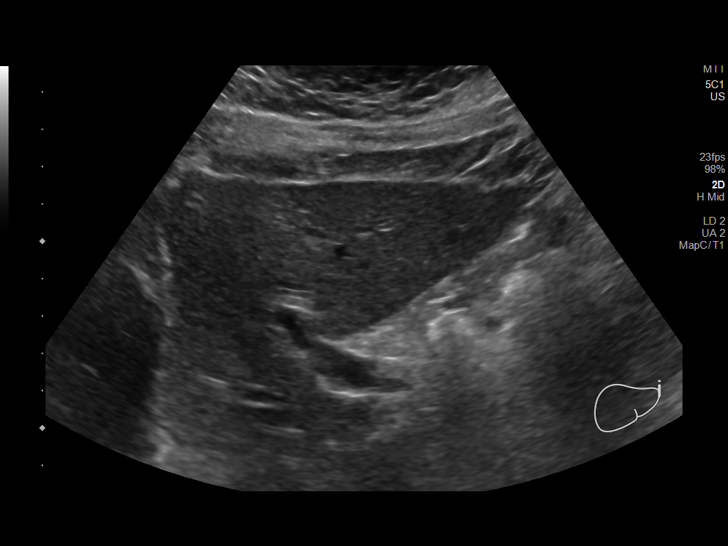
[im 38/113]
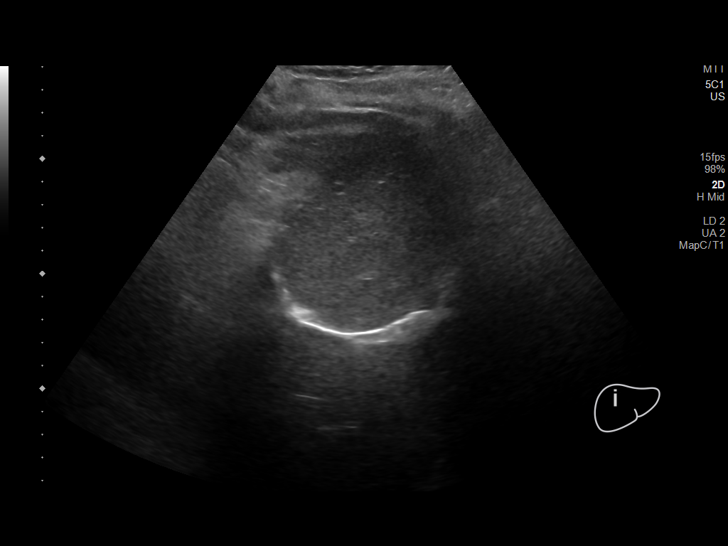
[im 43/113]
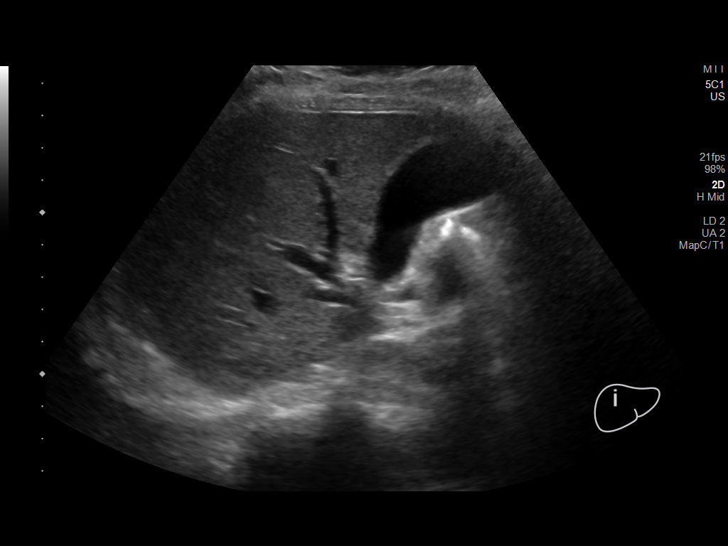
[im 52/113]
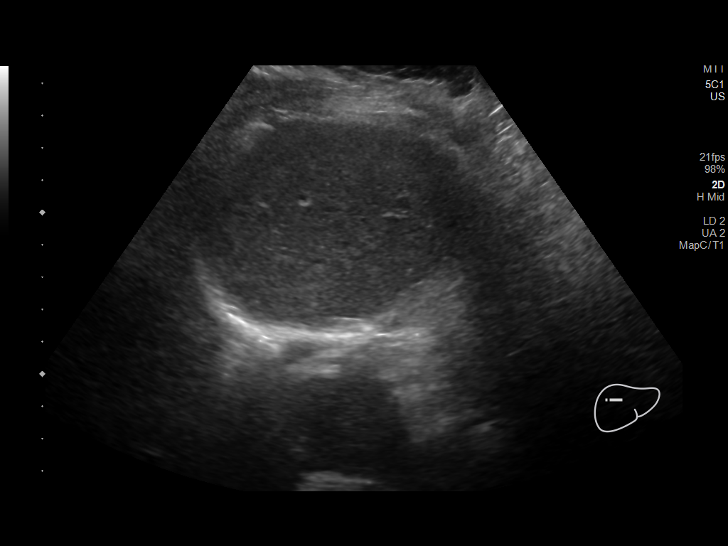
[im 61/113]
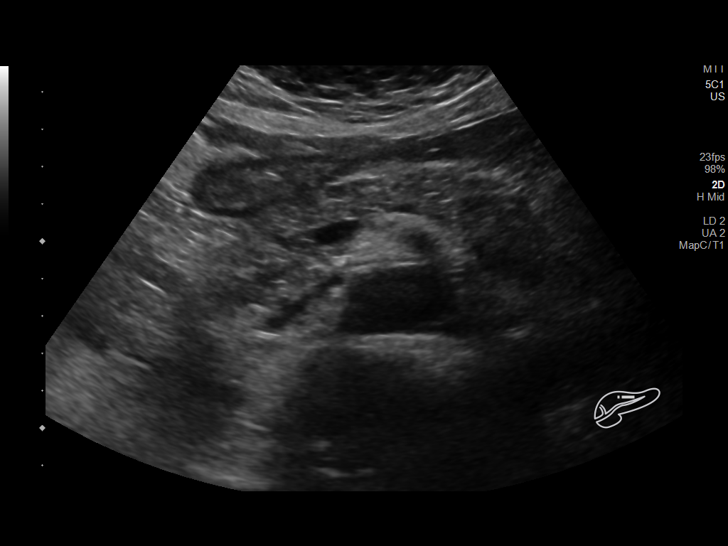
[im 71/113]
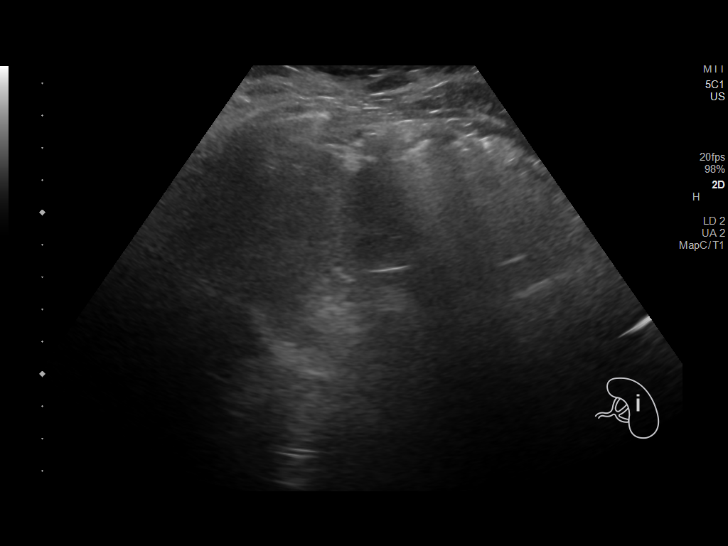
[im 75/113]
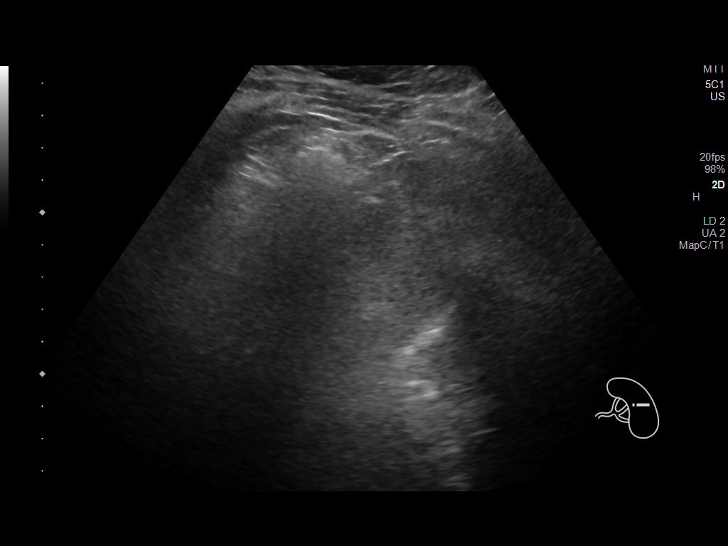
[im 85/113]
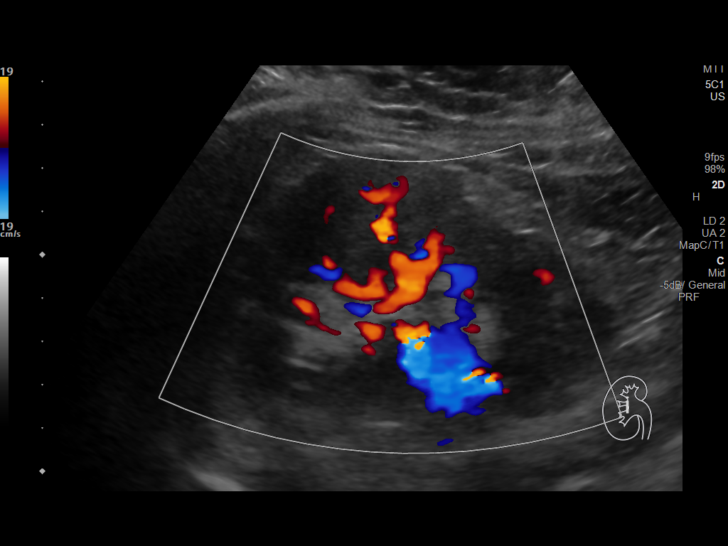
[im 94/113]
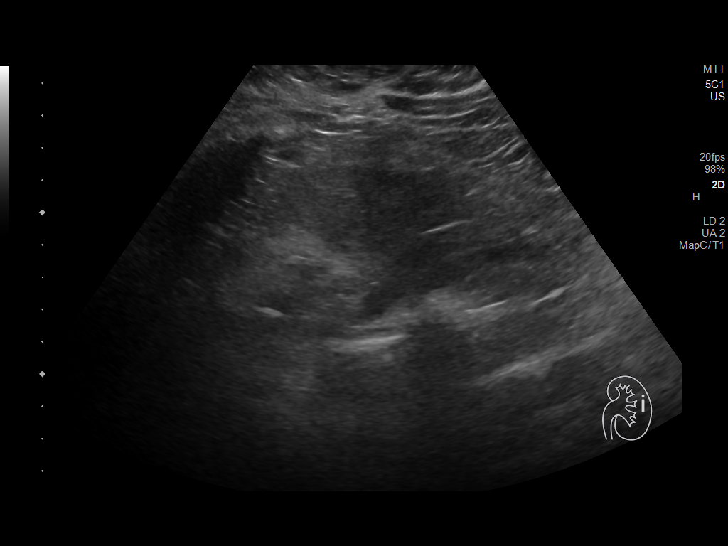
[im 103/113]
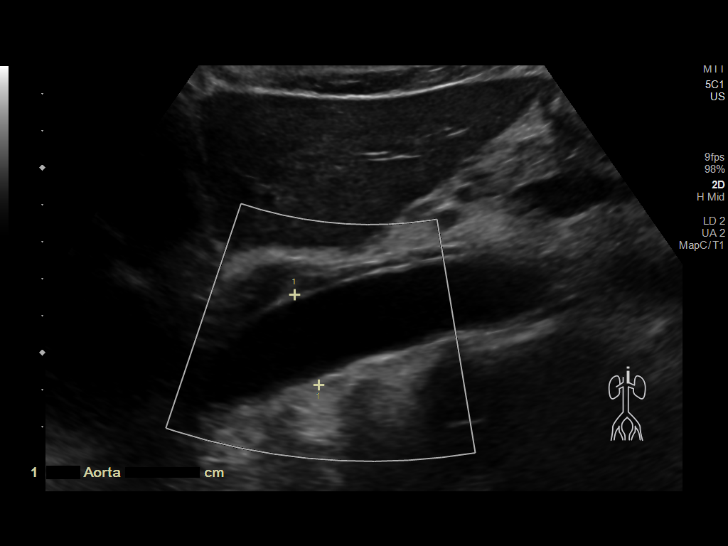
[im 113/113]
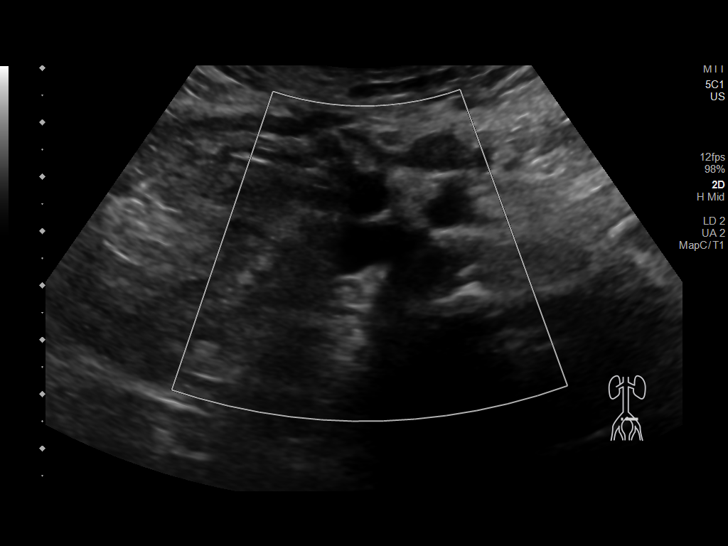

[14 of 25 positions shown; findings below may reference images not displayed]

FINDINGS: Gallbladder: No gallstones or wall thickening visualized. No
sonographic Murphy sign noted by sonographer.

Common bile duct: Diameter: 4 mm

Liver: No focal lesion identified. Within normal limits in
parenchymal echogenicity. Portal vein is patent on color Doppler
imaging with normal direction of blood flow towards the liver.

IVC: No abnormality visualized.

Pancreas: Visualized portion unremarkable.

Spleen: Size and appearance within normal limits.

Right Kidney: Length: 10.0 cm. Echogenicity within normal limits. No
mass or hydronephrosis visualized.

Left Kidney: Length: 10.4 cm. Echogenicity within normal limits. No
mass or hydronephrosis visualized.

Abdominal aorta: Mild atherosclerosis without evidence of aneurysm.

Other findings: No evidence of ascites.
IMPRESSION: Normal abdominal CT. No hepatic or biliary abnormalities identified.

## 2021-03-26 DIAGNOSIS — Z20822 Contact with and (suspected) exposure to covid-19: Secondary | ICD-10-CM | POA: Diagnosis not present

## 2021-04-27 DIAGNOSIS — Z20822 Contact with and (suspected) exposure to covid-19: Secondary | ICD-10-CM | POA: Diagnosis not present

## 2021-07-02 DIAGNOSIS — E1165 Type 2 diabetes mellitus with hyperglycemia: Secondary | ICD-10-CM | POA: Diagnosis not present

## 2021-07-02 DIAGNOSIS — B3731 Acute candidiasis of vulva and vagina: Secondary | ICD-10-CM | POA: Diagnosis not present

## 2021-07-02 DIAGNOSIS — Z23 Encounter for immunization: Secondary | ICD-10-CM | POA: Diagnosis not present

## 2021-07-02 DIAGNOSIS — K219 Gastro-esophageal reflux disease without esophagitis: Secondary | ICD-10-CM | POA: Diagnosis not present

## 2021-07-02 DIAGNOSIS — G894 Chronic pain syndrome: Secondary | ICD-10-CM | POA: Diagnosis not present

## 2021-07-02 DIAGNOSIS — I10 Essential (primary) hypertension: Secondary | ICD-10-CM | POA: Diagnosis not present

## 2021-07-02 DIAGNOSIS — E782 Mixed hyperlipidemia: Secondary | ICD-10-CM | POA: Diagnosis not present

## 2021-07-02 DIAGNOSIS — Z0000 Encounter for general adult medical examination without abnormal findings: Secondary | ICD-10-CM | POA: Diagnosis not present

## 2021-07-02 DIAGNOSIS — E559 Vitamin D deficiency, unspecified: Secondary | ICD-10-CM | POA: Diagnosis not present

## 2021-07-02 DIAGNOSIS — M170 Bilateral primary osteoarthritis of knee: Secondary | ICD-10-CM | POA: Diagnosis not present

## 2021-07-08 DIAGNOSIS — Z1231 Encounter for screening mammogram for malignant neoplasm of breast: Secondary | ICD-10-CM | POA: Diagnosis not present

## 2021-07-24 ENCOUNTER — Other Ambulatory Visit: Payer: Self-pay | Admitting: Anesthesiology

## 2021-07-24 ENCOUNTER — Ambulatory Visit
Admission: RE | Admit: 2021-07-24 | Discharge: 2021-07-24 | Disposition: A | Discharge status: Home / Self Care | Payer: Medicare Other | Source: Ambulatory Visit | Attending: Anesthesiology | Admitting: Anesthesiology

## 2021-07-24 DIAGNOSIS — M4628 Osteomyelitis of vertebra, sacral and sacrococcygeal region: Secondary | ICD-10-CM | POA: Diagnosis not present

## 2021-07-24 DIAGNOSIS — M1711 Unilateral primary osteoarthritis, right knee: Secondary | ICD-10-CM

## 2021-07-24 DIAGNOSIS — G894 Chronic pain syndrome: Secondary | ICD-10-CM | POA: Diagnosis not present

## 2021-07-24 DIAGNOSIS — M25561 Pain in right knee: Secondary | ICD-10-CM | POA: Diagnosis not present

## 2021-07-24 DIAGNOSIS — Z79891 Long term (current) use of opiate analgesic: Secondary | ICD-10-CM | POA: Diagnosis not present

## 2021-07-24 DIAGNOSIS — M7610 Psoas tendinitis, unspecified hip: Secondary | ICD-10-CM | POA: Diagnosis not present

## 2021-07-24 DIAGNOSIS — M545 Low back pain, unspecified: Secondary | ICD-10-CM | POA: Diagnosis not present

## 2021-07-24 DIAGNOSIS — M542 Cervicalgia: Secondary | ICD-10-CM | POA: Diagnosis not present

## 2021-07-24 DIAGNOSIS — M5137 Other intervertebral disc degeneration, lumbosacral region: Secondary | ICD-10-CM | POA: Diagnosis not present

## 2021-07-24 DIAGNOSIS — G890 Central pain syndrome: Secondary | ICD-10-CM | POA: Diagnosis not present

## 2021-07-30 DIAGNOSIS — R112 Nausea with vomiting, unspecified: Secondary | ICD-10-CM | POA: Diagnosis not present

## 2021-07-30 DIAGNOSIS — E559 Vitamin D deficiency, unspecified: Secondary | ICD-10-CM | POA: Diagnosis not present

## 2021-07-30 DIAGNOSIS — E1165 Type 2 diabetes mellitus with hyperglycemia: Secondary | ICD-10-CM | POA: Diagnosis not present

## 2021-07-30 DIAGNOSIS — K219 Gastro-esophageal reflux disease without esophagitis: Secondary | ICD-10-CM | POA: Diagnosis not present

## 2021-07-30 DIAGNOSIS — I10 Essential (primary) hypertension: Secondary | ICD-10-CM | POA: Diagnosis not present

## 2021-07-30 DIAGNOSIS — E782 Mixed hyperlipidemia: Secondary | ICD-10-CM | POA: Diagnosis not present

## 2021-07-30 DIAGNOSIS — M170 Bilateral primary osteoarthritis of knee: Secondary | ICD-10-CM | POA: Diagnosis not present

## 2021-07-30 DIAGNOSIS — G894 Chronic pain syndrome: Secondary | ICD-10-CM | POA: Diagnosis not present

## 2021-08-04 ENCOUNTER — Other Ambulatory Visit: Payer: Self-pay | Admitting: Anesthesiology

## 2021-08-04 DIAGNOSIS — M5136 Other intervertebral disc degeneration, lumbar region: Secondary | ICD-10-CM

## 2021-08-04 DIAGNOSIS — M5430 Sciatica, unspecified side: Secondary | ICD-10-CM

## 2021-08-17 DIAGNOSIS — G894 Chronic pain syndrome: Secondary | ICD-10-CM | POA: Diagnosis not present

## 2021-08-17 DIAGNOSIS — G890 Central pain syndrome: Secondary | ICD-10-CM | POA: Diagnosis not present

## 2021-08-17 DIAGNOSIS — M5137 Other intervertebral disc degeneration, lumbosacral region: Secondary | ICD-10-CM | POA: Diagnosis not present

## 2021-08-17 DIAGNOSIS — M4628 Osteomyelitis of vertebra, sacral and sacrococcygeal region: Secondary | ICD-10-CM | POA: Diagnosis not present

## 2021-08-17 DIAGNOSIS — Z79891 Long term (current) use of opiate analgesic: Secondary | ICD-10-CM | POA: Diagnosis not present

## 2021-08-17 DIAGNOSIS — M25561 Pain in right knee: Secondary | ICD-10-CM | POA: Diagnosis not present

## 2021-08-17 DIAGNOSIS — M542 Cervicalgia: Secondary | ICD-10-CM | POA: Diagnosis not present

## 2021-08-17 DIAGNOSIS — M7610 Psoas tendinitis, unspecified hip: Secondary | ICD-10-CM | POA: Diagnosis not present

## 2021-08-17 DIAGNOSIS — M545 Low back pain, unspecified: Secondary | ICD-10-CM | POA: Diagnosis not present

## 2021-09-09 DIAGNOSIS — M7610 Psoas tendinitis, unspecified hip: Secondary | ICD-10-CM | POA: Diagnosis not present

## 2021-09-09 DIAGNOSIS — Z79891 Long term (current) use of opiate analgesic: Secondary | ICD-10-CM | POA: Diagnosis not present

## 2021-09-09 DIAGNOSIS — M5137 Other intervertebral disc degeneration, lumbosacral region: Secondary | ICD-10-CM | POA: Diagnosis not present

## 2021-09-09 DIAGNOSIS — G894 Chronic pain syndrome: Secondary | ICD-10-CM | POA: Diagnosis not present

## 2021-09-09 DIAGNOSIS — G890 Central pain syndrome: Secondary | ICD-10-CM | POA: Diagnosis not present

## 2021-09-09 DIAGNOSIS — M545 Low back pain, unspecified: Secondary | ICD-10-CM | POA: Diagnosis not present

## 2021-09-09 DIAGNOSIS — M4628 Osteomyelitis of vertebra, sacral and sacrococcygeal region: Secondary | ICD-10-CM | POA: Diagnosis not present

## 2021-09-09 DIAGNOSIS — M542 Cervicalgia: Secondary | ICD-10-CM | POA: Diagnosis not present

## 2021-09-09 DIAGNOSIS — M25561 Pain in right knee: Secondary | ICD-10-CM | POA: Diagnosis not present

## 2021-09-21 DIAGNOSIS — M4807 Spinal stenosis, lumbosacral region: Secondary | ICD-10-CM | POA: Diagnosis not present

## 2021-09-21 DIAGNOSIS — M5147 Schmorl's nodes, lumbosacral region: Secondary | ICD-10-CM | POA: Diagnosis not present

## 2021-09-21 DIAGNOSIS — M5146 Schmorl's nodes, lumbar region: Secondary | ICD-10-CM | POA: Diagnosis not present

## 2021-09-21 DIAGNOSIS — M5116 Intervertebral disc disorders with radiculopathy, lumbar region: Secondary | ICD-10-CM | POA: Diagnosis not present

## 2021-09-21 DIAGNOSIS — M48061 Spinal stenosis, lumbar region without neurogenic claudication: Secondary | ICD-10-CM | POA: Diagnosis not present

## 2021-10-06 DIAGNOSIS — Z20822 Contact with and (suspected) exposure to covid-19: Secondary | ICD-10-CM | POA: Diagnosis not present

## 2021-10-09 DIAGNOSIS — M7610 Psoas tendinitis, unspecified hip: Secondary | ICD-10-CM | POA: Diagnosis not present

## 2021-10-09 DIAGNOSIS — Z79891 Long term (current) use of opiate analgesic: Secondary | ICD-10-CM | POA: Diagnosis not present

## 2021-10-09 DIAGNOSIS — M25561 Pain in right knee: Secondary | ICD-10-CM | POA: Diagnosis not present

## 2021-10-09 DIAGNOSIS — M4628 Osteomyelitis of vertebra, sacral and sacrococcygeal region: Secondary | ICD-10-CM | POA: Diagnosis not present

## 2021-10-09 DIAGNOSIS — M545 Low back pain, unspecified: Secondary | ICD-10-CM | POA: Diagnosis not present

## 2021-10-09 DIAGNOSIS — G894 Chronic pain syndrome: Secondary | ICD-10-CM | POA: Diagnosis not present

## 2021-10-09 DIAGNOSIS — M542 Cervicalgia: Secondary | ICD-10-CM | POA: Diagnosis not present

## 2021-10-09 DIAGNOSIS — G890 Central pain syndrome: Secondary | ICD-10-CM | POA: Diagnosis not present

## 2021-10-09 DIAGNOSIS — M5137 Other intervertebral disc degeneration, lumbosacral region: Secondary | ICD-10-CM | POA: Diagnosis not present

## 2021-10-16 DIAGNOSIS — M25561 Pain in right knee: Secondary | ICD-10-CM | POA: Diagnosis not present

## 2021-10-19 DIAGNOSIS — Z20822 Contact with and (suspected) exposure to covid-19: Secondary | ICD-10-CM | POA: Diagnosis not present

## 2021-11-03 ENCOUNTER — Ambulatory Visit
Admission: RE | Admit: 2021-11-03 | Discharge: 2021-11-03 | Disposition: A | Discharge status: Home / Self Care | Payer: Medicare Other | Source: Ambulatory Visit | Attending: Anesthesiology | Admitting: Anesthesiology

## 2021-11-03 ENCOUNTER — Other Ambulatory Visit: Payer: Self-pay | Admitting: Anesthesiology

## 2021-11-03 DIAGNOSIS — I10 Essential (primary) hypertension: Secondary | ICD-10-CM | POA: Diagnosis not present

## 2021-11-03 DIAGNOSIS — E559 Vitamin D deficiency, unspecified: Secondary | ICD-10-CM | POA: Diagnosis not present

## 2021-11-03 DIAGNOSIS — G890 Central pain syndrome: Secondary | ICD-10-CM | POA: Diagnosis not present

## 2021-11-03 DIAGNOSIS — E782 Mixed hyperlipidemia: Secondary | ICD-10-CM | POA: Diagnosis not present

## 2021-11-03 DIAGNOSIS — E1165 Type 2 diabetes mellitus with hyperglycemia: Secondary | ICD-10-CM | POA: Diagnosis not present

## 2021-11-03 DIAGNOSIS — M25561 Pain in right knee: Secondary | ICD-10-CM | POA: Diagnosis not present

## 2021-11-03 DIAGNOSIS — K219 Gastro-esophageal reflux disease without esophagitis: Secondary | ICD-10-CM | POA: Diagnosis not present

## 2021-11-03 DIAGNOSIS — S39012A Strain of muscle, fascia and tendon of lower back, initial encounter: Secondary | ICD-10-CM

## 2021-11-03 DIAGNOSIS — Z20822 Contact with and (suspected) exposure to covid-19: Secondary | ICD-10-CM | POA: Diagnosis not present

## 2021-11-03 DIAGNOSIS — M4628 Osteomyelitis of vertebra, sacral and sacrococcygeal region: Secondary | ICD-10-CM | POA: Diagnosis not present

## 2021-11-03 DIAGNOSIS — M5137 Other intervertebral disc degeneration, lumbosacral region: Secondary | ICD-10-CM | POA: Diagnosis not present

## 2021-11-03 DIAGNOSIS — Z79891 Long term (current) use of opiate analgesic: Secondary | ICD-10-CM | POA: Diagnosis not present

## 2021-11-03 DIAGNOSIS — M545 Low back pain, unspecified: Secondary | ICD-10-CM | POA: Diagnosis not present

## 2021-11-03 DIAGNOSIS — M542 Cervicalgia: Secondary | ICD-10-CM | POA: Diagnosis not present

## 2021-11-03 DIAGNOSIS — M7610 Psoas tendinitis, unspecified hip: Secondary | ICD-10-CM | POA: Diagnosis not present

## 2021-11-03 DIAGNOSIS — M170 Bilateral primary osteoarthritis of knee: Secondary | ICD-10-CM | POA: Diagnosis not present

## 2021-11-03 DIAGNOSIS — M5136 Other intervertebral disc degeneration, lumbar region: Secondary | ICD-10-CM | POA: Diagnosis not present

## 2021-11-03 DIAGNOSIS — G894 Chronic pain syndrome: Secondary | ICD-10-CM | POA: Diagnosis not present

## 2021-11-13 DIAGNOSIS — Z20822 Contact with and (suspected) exposure to covid-19: Secondary | ICD-10-CM | POA: Diagnosis not present

## 2021-11-13 DIAGNOSIS — M25561 Pain in right knee: Secondary | ICD-10-CM | POA: Diagnosis not present

## 2021-11-23 DIAGNOSIS — Z20822 Contact with and (suspected) exposure to covid-19: Secondary | ICD-10-CM | POA: Diagnosis not present

## 2021-11-23 DIAGNOSIS — R051 Acute cough: Secondary | ICD-10-CM | POA: Diagnosis not present

## 2021-11-23 DIAGNOSIS — R059 Cough, unspecified: Secondary | ICD-10-CM | POA: Diagnosis not present

## 2021-12-01 DIAGNOSIS — M4628 Osteomyelitis of vertebra, sacral and sacrococcygeal region: Secondary | ICD-10-CM | POA: Diagnosis not present

## 2021-12-01 DIAGNOSIS — M7610 Psoas tendinitis, unspecified hip: Secondary | ICD-10-CM | POA: Diagnosis not present

## 2021-12-01 DIAGNOSIS — M5137 Other intervertebral disc degeneration, lumbosacral region: Secondary | ICD-10-CM | POA: Diagnosis not present

## 2021-12-01 DIAGNOSIS — M25561 Pain in right knee: Secondary | ICD-10-CM | POA: Diagnosis not present

## 2021-12-01 DIAGNOSIS — G894 Chronic pain syndrome: Secondary | ICD-10-CM | POA: Diagnosis not present

## 2021-12-01 DIAGNOSIS — Z79891 Long term (current) use of opiate analgesic: Secondary | ICD-10-CM | POA: Diagnosis not present

## 2021-12-01 DIAGNOSIS — G890 Central pain syndrome: Secondary | ICD-10-CM | POA: Diagnosis not present

## 2021-12-01 DIAGNOSIS — M545 Low back pain, unspecified: Secondary | ICD-10-CM | POA: Diagnosis not present

## 2021-12-01 DIAGNOSIS — M542 Cervicalgia: Secondary | ICD-10-CM | POA: Diagnosis not present

## 2021-12-25 DIAGNOSIS — M25561 Pain in right knee: Secondary | ICD-10-CM | POA: Diagnosis not present

## 2021-12-28 DIAGNOSIS — Z20822 Contact with and (suspected) exposure to covid-19: Secondary | ICD-10-CM | POA: Diagnosis not present

## 2021-12-30 DIAGNOSIS — Z1152 Encounter for screening for COVID-19: Secondary | ICD-10-CM | POA: Diagnosis not present

## 2021-12-30 DIAGNOSIS — Z20828 Contact with and (suspected) exposure to other viral communicable diseases: Secondary | ICD-10-CM | POA: Diagnosis not present

## 2022-01-07 DIAGNOSIS — M25561 Pain in right knee: Secondary | ICD-10-CM | POA: Diagnosis not present

## 2022-01-07 DIAGNOSIS — G890 Central pain syndrome: Secondary | ICD-10-CM | POA: Diagnosis not present

## 2022-01-07 DIAGNOSIS — M545 Low back pain, unspecified: Secondary | ICD-10-CM | POA: Diagnosis not present

## 2022-01-07 DIAGNOSIS — M7610 Psoas tendinitis, unspecified hip: Secondary | ICD-10-CM | POA: Diagnosis not present

## 2022-01-07 DIAGNOSIS — Z79891 Long term (current) use of opiate analgesic: Secondary | ICD-10-CM | POA: Diagnosis not present

## 2022-01-07 DIAGNOSIS — M4628 Osteomyelitis of vertebra, sacral and sacrococcygeal region: Secondary | ICD-10-CM | POA: Diagnosis not present

## 2022-01-07 DIAGNOSIS — M5137 Other intervertebral disc degeneration, lumbosacral region: Secondary | ICD-10-CM | POA: Diagnosis not present

## 2022-01-07 DIAGNOSIS — G894 Chronic pain syndrome: Secondary | ICD-10-CM | POA: Diagnosis not present

## 2022-01-07 DIAGNOSIS — M542 Cervicalgia: Secondary | ICD-10-CM | POA: Diagnosis not present

## 2022-01-21 DIAGNOSIS — I10 Essential (primary) hypertension: Secondary | ICD-10-CM | POA: Diagnosis not present

## 2022-01-21 DIAGNOSIS — M170 Bilateral primary osteoarthritis of knee: Secondary | ICD-10-CM | POA: Diagnosis not present

## 2022-01-21 DIAGNOSIS — M940 Chondrocostal junction syndrome [Tietze]: Secondary | ICD-10-CM | POA: Diagnosis not present

## 2022-01-21 DIAGNOSIS — G894 Chronic pain syndrome: Secondary | ICD-10-CM | POA: Diagnosis not present

## 2022-01-21 DIAGNOSIS — E1165 Type 2 diabetes mellitus with hyperglycemia: Secondary | ICD-10-CM | POA: Diagnosis not present

## 2022-01-21 DIAGNOSIS — E559 Vitamin D deficiency, unspecified: Secondary | ICD-10-CM | POA: Diagnosis not present

## 2022-01-21 DIAGNOSIS — K219 Gastro-esophageal reflux disease without esophagitis: Secondary | ICD-10-CM | POA: Diagnosis not present

## 2022-01-21 DIAGNOSIS — E782 Mixed hyperlipidemia: Secondary | ICD-10-CM | POA: Diagnosis not present

## 2022-01-25 DIAGNOSIS — E782 Mixed hyperlipidemia: Secondary | ICD-10-CM | POA: Diagnosis not present

## 2022-01-25 DIAGNOSIS — E559 Vitamin D deficiency, unspecified: Secondary | ICD-10-CM | POA: Diagnosis not present

## 2022-01-25 DIAGNOSIS — K219 Gastro-esophageal reflux disease without esophagitis: Secondary | ICD-10-CM | POA: Diagnosis not present

## 2022-01-25 DIAGNOSIS — M170 Bilateral primary osteoarthritis of knee: Secondary | ICD-10-CM | POA: Diagnosis not present

## 2022-01-25 DIAGNOSIS — E1165 Type 2 diabetes mellitus with hyperglycemia: Secondary | ICD-10-CM | POA: Diagnosis not present

## 2022-01-25 DIAGNOSIS — G894 Chronic pain syndrome: Secondary | ICD-10-CM | POA: Diagnosis not present

## 2022-01-25 DIAGNOSIS — I10 Essential (primary) hypertension: Secondary | ICD-10-CM | POA: Diagnosis not present

## 2022-02-04 DIAGNOSIS — N289 Disorder of kidney and ureter, unspecified: Secondary | ICD-10-CM | POA: Diagnosis not present

## 2022-02-04 DIAGNOSIS — M170 Bilateral primary osteoarthritis of knee: Secondary | ICD-10-CM | POA: Diagnosis not present

## 2022-02-04 DIAGNOSIS — G894 Chronic pain syndrome: Secondary | ICD-10-CM | POA: Diagnosis not present

## 2022-02-04 DIAGNOSIS — E782 Mixed hyperlipidemia: Secondary | ICD-10-CM | POA: Diagnosis not present

## 2022-02-04 DIAGNOSIS — E559 Vitamin D deficiency, unspecified: Secondary | ICD-10-CM | POA: Diagnosis not present

## 2022-02-04 DIAGNOSIS — I10 Essential (primary) hypertension: Secondary | ICD-10-CM | POA: Diagnosis not present

## 2022-02-04 DIAGNOSIS — E1165 Type 2 diabetes mellitus with hyperglycemia: Secondary | ICD-10-CM | POA: Diagnosis not present

## 2022-02-04 DIAGNOSIS — K219 Gastro-esophageal reflux disease without esophagitis: Secondary | ICD-10-CM | POA: Diagnosis not present

## 2022-02-05 DIAGNOSIS — M7610 Psoas tendinitis, unspecified hip: Secondary | ICD-10-CM | POA: Diagnosis not present

## 2022-02-05 DIAGNOSIS — Z79891 Long term (current) use of opiate analgesic: Secondary | ICD-10-CM | POA: Diagnosis not present

## 2022-02-05 DIAGNOSIS — M542 Cervicalgia: Secondary | ICD-10-CM | POA: Diagnosis not present

## 2022-02-05 DIAGNOSIS — G890 Central pain syndrome: Secondary | ICD-10-CM | POA: Diagnosis not present

## 2022-02-05 DIAGNOSIS — G894 Chronic pain syndrome: Secondary | ICD-10-CM | POA: Diagnosis not present

## 2022-02-05 DIAGNOSIS — M4628 Osteomyelitis of vertebra, sacral and sacrococcygeal region: Secondary | ICD-10-CM | POA: Diagnosis not present

## 2022-02-05 DIAGNOSIS — M545 Low back pain, unspecified: Secondary | ICD-10-CM | POA: Diagnosis not present

## 2022-02-05 DIAGNOSIS — M5137 Other intervertebral disc degeneration, lumbosacral region: Secondary | ICD-10-CM | POA: Diagnosis not present

## 2022-03-08 DIAGNOSIS — G894 Chronic pain syndrome: Secondary | ICD-10-CM | POA: Diagnosis not present

## 2022-03-08 DIAGNOSIS — N289 Disorder of kidney and ureter, unspecified: Secondary | ICD-10-CM | POA: Diagnosis not present

## 2022-03-08 DIAGNOSIS — E782 Mixed hyperlipidemia: Secondary | ICD-10-CM | POA: Diagnosis not present

## 2022-03-08 DIAGNOSIS — M170 Bilateral primary osteoarthritis of knee: Secondary | ICD-10-CM | POA: Diagnosis not present

## 2022-03-08 DIAGNOSIS — I10 Essential (primary) hypertension: Secondary | ICD-10-CM | POA: Diagnosis not present

## 2022-03-08 DIAGNOSIS — E559 Vitamin D deficiency, unspecified: Secondary | ICD-10-CM | POA: Diagnosis not present

## 2022-03-08 DIAGNOSIS — K219 Gastro-esophageal reflux disease without esophagitis: Secondary | ICD-10-CM | POA: Diagnosis not present

## 2022-03-08 DIAGNOSIS — E1165 Type 2 diabetes mellitus with hyperglycemia: Secondary | ICD-10-CM | POA: Diagnosis not present

## 2022-03-12 DIAGNOSIS — M5137 Other intervertebral disc degeneration, lumbosacral region: Secondary | ICD-10-CM | POA: Diagnosis not present

## 2022-03-12 DIAGNOSIS — G894 Chronic pain syndrome: Secondary | ICD-10-CM | POA: Diagnosis not present

## 2022-03-12 DIAGNOSIS — M545 Low back pain, unspecified: Secondary | ICD-10-CM | POA: Diagnosis not present

## 2022-03-12 DIAGNOSIS — G890 Central pain syndrome: Secondary | ICD-10-CM | POA: Diagnosis not present

## 2022-03-12 DIAGNOSIS — M7610 Psoas tendinitis, unspecified hip: Secondary | ICD-10-CM | POA: Diagnosis not present

## 2022-03-12 DIAGNOSIS — Z79891 Long term (current) use of opiate analgesic: Secondary | ICD-10-CM | POA: Diagnosis not present

## 2022-03-12 DIAGNOSIS — M542 Cervicalgia: Secondary | ICD-10-CM | POA: Diagnosis not present

## 2022-03-12 DIAGNOSIS — M4628 Osteomyelitis of vertebra, sacral and sacrococcygeal region: Secondary | ICD-10-CM | POA: Diagnosis not present

## 2022-03-12 DIAGNOSIS — M25561 Pain in right knee: Secondary | ICD-10-CM | POA: Diagnosis not present

## 2022-04-16 DIAGNOSIS — M545 Low back pain, unspecified: Secondary | ICD-10-CM | POA: Diagnosis not present

## 2022-04-16 DIAGNOSIS — M4628 Osteomyelitis of vertebra, sacral and sacrococcygeal region: Secondary | ICD-10-CM | POA: Diagnosis not present

## 2022-04-16 DIAGNOSIS — M7610 Psoas tendinitis, unspecified hip: Secondary | ICD-10-CM | POA: Diagnosis not present

## 2022-04-16 DIAGNOSIS — M5137 Other intervertebral disc degeneration, lumbosacral region: Secondary | ICD-10-CM | POA: Diagnosis not present

## 2022-04-16 DIAGNOSIS — G890 Central pain syndrome: Secondary | ICD-10-CM | POA: Diagnosis not present

## 2022-04-16 DIAGNOSIS — M542 Cervicalgia: Secondary | ICD-10-CM | POA: Diagnosis not present

## 2022-04-16 DIAGNOSIS — G894 Chronic pain syndrome: Secondary | ICD-10-CM | POA: Diagnosis not present

## 2022-04-16 DIAGNOSIS — M25561 Pain in right knee: Secondary | ICD-10-CM | POA: Diagnosis not present

## 2022-04-16 DIAGNOSIS — Z79891 Long term (current) use of opiate analgesic: Secondary | ICD-10-CM | POA: Diagnosis not present

## 2022-05-14 DIAGNOSIS — M5137 Other intervertebral disc degeneration, lumbosacral region: Secondary | ICD-10-CM | POA: Diagnosis not present

## 2022-05-14 DIAGNOSIS — M25561 Pain in right knee: Secondary | ICD-10-CM | POA: Diagnosis not present

## 2022-05-14 DIAGNOSIS — M545 Low back pain, unspecified: Secondary | ICD-10-CM | POA: Diagnosis not present

## 2022-05-14 DIAGNOSIS — M7610 Psoas tendinitis, unspecified hip: Secondary | ICD-10-CM | POA: Diagnosis not present

## 2022-05-14 DIAGNOSIS — G890 Central pain syndrome: Secondary | ICD-10-CM | POA: Diagnosis not present

## 2022-05-14 DIAGNOSIS — G894 Chronic pain syndrome: Secondary | ICD-10-CM | POA: Diagnosis not present

## 2022-05-14 DIAGNOSIS — Z79891 Long term (current) use of opiate analgesic: Secondary | ICD-10-CM | POA: Diagnosis not present

## 2022-05-14 DIAGNOSIS — M4628 Osteomyelitis of vertebra, sacral and sacrococcygeal region: Secondary | ICD-10-CM | POA: Diagnosis not present

## 2022-05-14 DIAGNOSIS — M542 Cervicalgia: Secondary | ICD-10-CM | POA: Diagnosis not present

## 2022-06-11 DIAGNOSIS — M4628 Osteomyelitis of vertebra, sacral and sacrococcygeal region: Secondary | ICD-10-CM | POA: Diagnosis not present

## 2022-06-11 DIAGNOSIS — Z79891 Long term (current) use of opiate analgesic: Secondary | ICD-10-CM | POA: Diagnosis not present

## 2022-06-11 DIAGNOSIS — G894 Chronic pain syndrome: Secondary | ICD-10-CM | POA: Diagnosis not present

## 2022-06-11 DIAGNOSIS — M7610 Psoas tendinitis, unspecified hip: Secondary | ICD-10-CM | POA: Diagnosis not present

## 2022-06-11 DIAGNOSIS — M545 Low back pain, unspecified: Secondary | ICD-10-CM | POA: Diagnosis not present

## 2022-06-11 DIAGNOSIS — M5137 Other intervertebral disc degeneration, lumbosacral region: Secondary | ICD-10-CM | POA: Diagnosis not present

## 2022-06-11 DIAGNOSIS — G890 Central pain syndrome: Secondary | ICD-10-CM | POA: Diagnosis not present

## 2022-06-11 DIAGNOSIS — M25561 Pain in right knee: Secondary | ICD-10-CM | POA: Diagnosis not present

## 2022-06-11 DIAGNOSIS — M542 Cervicalgia: Secondary | ICD-10-CM | POA: Diagnosis not present

## 2022-07-02 DIAGNOSIS — Z23 Encounter for immunization: Secondary | ICD-10-CM | POA: Diagnosis not present

## 2022-07-02 DIAGNOSIS — E782 Mixed hyperlipidemia: Secondary | ICD-10-CM | POA: Diagnosis not present

## 2022-07-02 DIAGNOSIS — I10 Essential (primary) hypertension: Secondary | ICD-10-CM | POA: Diagnosis not present

## 2022-07-02 DIAGNOSIS — E1165 Type 2 diabetes mellitus with hyperglycemia: Secondary | ICD-10-CM | POA: Diagnosis not present

## 2022-07-02 DIAGNOSIS — M170 Bilateral primary osteoarthritis of knee: Secondary | ICD-10-CM | POA: Diagnosis not present

## 2022-07-02 DIAGNOSIS — E559 Vitamin D deficiency, unspecified: Secondary | ICD-10-CM | POA: Diagnosis not present

## 2022-07-02 DIAGNOSIS — Z0001 Encounter for general adult medical examination with abnormal findings: Secondary | ICD-10-CM | POA: Diagnosis not present

## 2022-07-02 DIAGNOSIS — K219 Gastro-esophageal reflux disease without esophagitis: Secondary | ICD-10-CM | POA: Diagnosis not present

## 2022-07-02 DIAGNOSIS — G894 Chronic pain syndrome: Secondary | ICD-10-CM | POA: Diagnosis not present

## 2022-07-09 DIAGNOSIS — M542 Cervicalgia: Secondary | ICD-10-CM | POA: Diagnosis not present

## 2022-07-09 DIAGNOSIS — M5137 Other intervertebral disc degeneration, lumbosacral region: Secondary | ICD-10-CM | POA: Diagnosis not present

## 2022-07-09 DIAGNOSIS — M4628 Osteomyelitis of vertebra, sacral and sacrococcygeal region: Secondary | ICD-10-CM | POA: Diagnosis not present

## 2022-07-09 DIAGNOSIS — M7610 Psoas tendinitis, unspecified hip: Secondary | ICD-10-CM | POA: Diagnosis not present

## 2022-07-09 DIAGNOSIS — M545 Low back pain, unspecified: Secondary | ICD-10-CM | POA: Diagnosis not present

## 2022-07-09 DIAGNOSIS — G894 Chronic pain syndrome: Secondary | ICD-10-CM | POA: Diagnosis not present

## 2022-07-09 DIAGNOSIS — G890 Central pain syndrome: Secondary | ICD-10-CM | POA: Diagnosis not present

## 2022-07-09 DIAGNOSIS — Z79891 Long term (current) use of opiate analgesic: Secondary | ICD-10-CM | POA: Diagnosis not present

## 2022-07-09 DIAGNOSIS — M25561 Pain in right knee: Secondary | ICD-10-CM | POA: Diagnosis not present

## 2022-07-16 DIAGNOSIS — Z1231 Encounter for screening mammogram for malignant neoplasm of breast: Secondary | ICD-10-CM | POA: Diagnosis not present

## 2022-08-06 DIAGNOSIS — M4628 Osteomyelitis of vertebra, sacral and sacrococcygeal region: Secondary | ICD-10-CM | POA: Diagnosis not present

## 2022-08-06 DIAGNOSIS — G894 Chronic pain syndrome: Secondary | ICD-10-CM | POA: Diagnosis not present

## 2022-08-06 DIAGNOSIS — M545 Low back pain, unspecified: Secondary | ICD-10-CM | POA: Diagnosis not present

## 2022-08-06 DIAGNOSIS — Z79891 Long term (current) use of opiate analgesic: Secondary | ICD-10-CM | POA: Diagnosis not present

## 2022-08-06 DIAGNOSIS — G890 Central pain syndrome: Secondary | ICD-10-CM | POA: Diagnosis not present

## 2022-08-06 DIAGNOSIS — M25561 Pain in right knee: Secondary | ICD-10-CM | POA: Diagnosis not present

## 2022-08-06 DIAGNOSIS — M5137 Other intervertebral disc degeneration, lumbosacral region: Secondary | ICD-10-CM | POA: Diagnosis not present

## 2022-08-06 DIAGNOSIS — M7610 Psoas tendinitis, unspecified hip: Secondary | ICD-10-CM | POA: Diagnosis not present

## 2022-08-06 DIAGNOSIS — M542 Cervicalgia: Secondary | ICD-10-CM | POA: Diagnosis not present

## 2022-09-03 DIAGNOSIS — M5137 Other intervertebral disc degeneration, lumbosacral region: Secondary | ICD-10-CM | POA: Diagnosis not present

## 2022-09-03 DIAGNOSIS — Z79891 Long term (current) use of opiate analgesic: Secondary | ICD-10-CM | POA: Diagnosis not present

## 2022-09-03 DIAGNOSIS — G890 Central pain syndrome: Secondary | ICD-10-CM | POA: Diagnosis not present

## 2022-09-03 DIAGNOSIS — M545 Low back pain, unspecified: Secondary | ICD-10-CM | POA: Diagnosis not present

## 2022-09-03 DIAGNOSIS — M4628 Osteomyelitis of vertebra, sacral and sacrococcygeal region: Secondary | ICD-10-CM | POA: Diagnosis not present

## 2022-09-03 DIAGNOSIS — M25561 Pain in right knee: Secondary | ICD-10-CM | POA: Diagnosis not present

## 2022-09-03 DIAGNOSIS — G894 Chronic pain syndrome: Secondary | ICD-10-CM | POA: Diagnosis not present

## 2022-09-03 DIAGNOSIS — M542 Cervicalgia: Secondary | ICD-10-CM | POA: Diagnosis not present

## 2022-09-03 DIAGNOSIS — M7610 Psoas tendinitis, unspecified hip: Secondary | ICD-10-CM | POA: Diagnosis not present

## 2022-10-01 DIAGNOSIS — M5137 Other intervertebral disc degeneration, lumbosacral region: Secondary | ICD-10-CM | POA: Diagnosis not present

## 2022-10-01 DIAGNOSIS — G894 Chronic pain syndrome: Secondary | ICD-10-CM | POA: Diagnosis not present

## 2022-10-01 DIAGNOSIS — M4628 Osteomyelitis of vertebra, sacral and sacrococcygeal region: Secondary | ICD-10-CM | POA: Diagnosis not present

## 2022-10-01 DIAGNOSIS — M542 Cervicalgia: Secondary | ICD-10-CM | POA: Diagnosis not present

## 2022-10-01 DIAGNOSIS — M7610 Psoas tendinitis, unspecified hip: Secondary | ICD-10-CM | POA: Diagnosis not present

## 2022-10-01 DIAGNOSIS — M545 Low back pain, unspecified: Secondary | ICD-10-CM | POA: Diagnosis not present

## 2022-10-01 DIAGNOSIS — Z79891 Long term (current) use of opiate analgesic: Secondary | ICD-10-CM | POA: Diagnosis not present

## 2022-10-01 DIAGNOSIS — M25561 Pain in right knee: Secondary | ICD-10-CM | POA: Diagnosis not present

## 2022-10-01 DIAGNOSIS — G890 Central pain syndrome: Secondary | ICD-10-CM | POA: Diagnosis not present

## 2022-10-08 DIAGNOSIS — E782 Mixed hyperlipidemia: Secondary | ICD-10-CM | POA: Diagnosis not present

## 2022-10-08 DIAGNOSIS — E1165 Type 2 diabetes mellitus with hyperglycemia: Secondary | ICD-10-CM | POA: Diagnosis not present

## 2022-10-08 DIAGNOSIS — K219 Gastro-esophageal reflux disease without esophagitis: Secondary | ICD-10-CM | POA: Diagnosis not present

## 2022-10-08 DIAGNOSIS — I10 Essential (primary) hypertension: Secondary | ICD-10-CM | POA: Diagnosis not present

## 2022-10-08 DIAGNOSIS — E559 Vitamin D deficiency, unspecified: Secondary | ICD-10-CM | POA: Diagnosis not present

## 2022-10-08 DIAGNOSIS — G894 Chronic pain syndrome: Secondary | ICD-10-CM | POA: Diagnosis not present

## 2022-10-08 DIAGNOSIS — M170 Bilateral primary osteoarthritis of knee: Secondary | ICD-10-CM | POA: Diagnosis not present

## 2022-10-08 DIAGNOSIS — Z23 Encounter for immunization: Secondary | ICD-10-CM | POA: Diagnosis not present

## 2022-10-29 DIAGNOSIS — M25561 Pain in right knee: Secondary | ICD-10-CM | POA: Diagnosis not present

## 2022-10-29 DIAGNOSIS — Z79891 Long term (current) use of opiate analgesic: Secondary | ICD-10-CM | POA: Diagnosis not present

## 2022-10-29 DIAGNOSIS — M7610 Psoas tendinitis, unspecified hip: Secondary | ICD-10-CM | POA: Diagnosis not present

## 2022-10-29 DIAGNOSIS — M5137 Other intervertebral disc degeneration, lumbosacral region: Secondary | ICD-10-CM | POA: Diagnosis not present

## 2022-10-29 DIAGNOSIS — G894 Chronic pain syndrome: Secondary | ICD-10-CM | POA: Diagnosis not present

## 2022-10-29 DIAGNOSIS — M4628 Osteomyelitis of vertebra, sacral and sacrococcygeal region: Secondary | ICD-10-CM | POA: Diagnosis not present

## 2022-12-10 DIAGNOSIS — G890 Central pain syndrome: Secondary | ICD-10-CM | POA: Diagnosis not present

## 2022-12-10 DIAGNOSIS — M545 Low back pain, unspecified: Secondary | ICD-10-CM | POA: Diagnosis not present

## 2022-12-10 DIAGNOSIS — M4628 Osteomyelitis of vertebra, sacral and sacrococcygeal region: Secondary | ICD-10-CM | POA: Diagnosis not present

## 2022-12-10 DIAGNOSIS — Z79891 Long term (current) use of opiate analgesic: Secondary | ICD-10-CM | POA: Diagnosis not present

## 2022-12-10 DIAGNOSIS — G894 Chronic pain syndrome: Secondary | ICD-10-CM | POA: Diagnosis not present

## 2022-12-10 DIAGNOSIS — M25561 Pain in right knee: Secondary | ICD-10-CM | POA: Diagnosis not present

## 2022-12-10 DIAGNOSIS — M791 Myalgia, unspecified site: Secondary | ICD-10-CM | POA: Diagnosis not present

## 2022-12-10 DIAGNOSIS — M5137 Other intervertebral disc degeneration, lumbosacral region: Secondary | ICD-10-CM | POA: Diagnosis not present

## 2022-12-10 DIAGNOSIS — M542 Cervicalgia: Secondary | ICD-10-CM | POA: Diagnosis not present

## 2023-01-07 DIAGNOSIS — G894 Chronic pain syndrome: Secondary | ICD-10-CM | POA: Diagnosis not present

## 2023-01-07 DIAGNOSIS — M4628 Osteomyelitis of vertebra, sacral and sacrococcygeal region: Secondary | ICD-10-CM | POA: Diagnosis not present

## 2023-01-07 DIAGNOSIS — M545 Low back pain, unspecified: Secondary | ICD-10-CM | POA: Diagnosis not present

## 2023-01-07 DIAGNOSIS — Z79891 Long term (current) use of opiate analgesic: Secondary | ICD-10-CM | POA: Diagnosis not present

## 2023-01-07 DIAGNOSIS — M542 Cervicalgia: Secondary | ICD-10-CM | POA: Diagnosis not present

## 2023-01-07 DIAGNOSIS — G890 Central pain syndrome: Secondary | ICD-10-CM | POA: Diagnosis not present

## 2023-01-07 DIAGNOSIS — M5137 Other intervertebral disc degeneration, lumbosacral region: Secondary | ICD-10-CM | POA: Diagnosis not present

## 2023-01-07 DIAGNOSIS — M25561 Pain in right knee: Secondary | ICD-10-CM | POA: Diagnosis not present

## 2023-01-07 DIAGNOSIS — M7610 Psoas tendinitis, unspecified hip: Secondary | ICD-10-CM | POA: Diagnosis not present

## 2023-01-21 DIAGNOSIS — M25561 Pain in right knee: Secondary | ICD-10-CM | POA: Diagnosis not present

## 2023-01-28 DIAGNOSIS — M170 Bilateral primary osteoarthritis of knee: Secondary | ICD-10-CM | POA: Diagnosis not present

## 2023-01-28 DIAGNOSIS — G894 Chronic pain syndrome: Secondary | ICD-10-CM | POA: Diagnosis not present

## 2023-01-28 DIAGNOSIS — E559 Vitamin D deficiency, unspecified: Secondary | ICD-10-CM | POA: Diagnosis not present

## 2023-01-28 DIAGNOSIS — E782 Mixed hyperlipidemia: Secondary | ICD-10-CM | POA: Diagnosis not present

## 2023-01-28 DIAGNOSIS — R051 Acute cough: Secondary | ICD-10-CM | POA: Diagnosis not present

## 2023-01-28 DIAGNOSIS — K219 Gastro-esophageal reflux disease without esophagitis: Secondary | ICD-10-CM | POA: Diagnosis not present

## 2023-01-28 DIAGNOSIS — E1165 Type 2 diabetes mellitus with hyperglycemia: Secondary | ICD-10-CM | POA: Diagnosis not present

## 2023-01-28 DIAGNOSIS — I10 Essential (primary) hypertension: Secondary | ICD-10-CM | POA: Diagnosis not present

## 2023-02-04 DIAGNOSIS — M7610 Psoas tendinitis, unspecified hip: Secondary | ICD-10-CM | POA: Diagnosis not present

## 2023-02-04 DIAGNOSIS — M4628 Osteomyelitis of vertebra, sacral and sacrococcygeal region: Secondary | ICD-10-CM | POA: Diagnosis not present

## 2023-02-04 DIAGNOSIS — Z79891 Long term (current) use of opiate analgesic: Secondary | ICD-10-CM | POA: Diagnosis not present

## 2023-02-04 DIAGNOSIS — M5137 Other intervertebral disc degeneration, lumbosacral region: Secondary | ICD-10-CM | POA: Diagnosis not present

## 2023-02-04 DIAGNOSIS — M542 Cervicalgia: Secondary | ICD-10-CM | POA: Diagnosis not present

## 2023-02-04 DIAGNOSIS — M545 Low back pain, unspecified: Secondary | ICD-10-CM | POA: Diagnosis not present

## 2023-02-04 DIAGNOSIS — G894 Chronic pain syndrome: Secondary | ICD-10-CM | POA: Diagnosis not present

## 2023-02-04 DIAGNOSIS — M25561 Pain in right knee: Secondary | ICD-10-CM | POA: Diagnosis not present

## 2023-02-04 DIAGNOSIS — Z79899 Other long term (current) drug therapy: Secondary | ICD-10-CM | POA: Diagnosis not present

## 2023-02-04 DIAGNOSIS — G890 Central pain syndrome: Secondary | ICD-10-CM | POA: Diagnosis not present

## 2023-02-07 DIAGNOSIS — K219 Gastro-esophageal reflux disease without esophagitis: Secondary | ICD-10-CM | POA: Diagnosis not present

## 2023-02-07 DIAGNOSIS — G894 Chronic pain syndrome: Secondary | ICD-10-CM | POA: Diagnosis not present

## 2023-02-07 DIAGNOSIS — E1165 Type 2 diabetes mellitus with hyperglycemia: Secondary | ICD-10-CM | POA: Diagnosis not present

## 2023-02-07 DIAGNOSIS — E559 Vitamin D deficiency, unspecified: Secondary | ICD-10-CM | POA: Diagnosis not present

## 2023-02-07 DIAGNOSIS — E782 Mixed hyperlipidemia: Secondary | ICD-10-CM | POA: Diagnosis not present

## 2023-02-07 DIAGNOSIS — I10 Essential (primary) hypertension: Secondary | ICD-10-CM | POA: Diagnosis not present

## 2023-02-07 DIAGNOSIS — M170 Bilateral primary osteoarthritis of knee: Secondary | ICD-10-CM | POA: Diagnosis not present

## 2023-02-18 DIAGNOSIS — M25561 Pain in right knee: Secondary | ICD-10-CM | POA: Diagnosis not present

## 2023-02-18 DIAGNOSIS — M1711 Unilateral primary osteoarthritis, right knee: Secondary | ICD-10-CM | POA: Diagnosis not present

## 2023-03-04 DIAGNOSIS — Z79899 Other long term (current) drug therapy: Secondary | ICD-10-CM | POA: Diagnosis not present

## 2023-03-04 DIAGNOSIS — M4628 Osteomyelitis of vertebra, sacral and sacrococcygeal region: Secondary | ICD-10-CM | POA: Diagnosis not present

## 2023-03-04 DIAGNOSIS — G894 Chronic pain syndrome: Secondary | ICD-10-CM | POA: Diagnosis not present

## 2023-03-04 DIAGNOSIS — G890 Central pain syndrome: Secondary | ICD-10-CM | POA: Diagnosis not present

## 2023-03-04 DIAGNOSIS — Z79891 Long term (current) use of opiate analgesic: Secondary | ICD-10-CM | POA: Diagnosis not present

## 2023-03-04 DIAGNOSIS — M545 Low back pain, unspecified: Secondary | ICD-10-CM | POA: Diagnosis not present

## 2023-03-04 DIAGNOSIS — M25561 Pain in right knee: Secondary | ICD-10-CM | POA: Diagnosis not present

## 2023-03-04 DIAGNOSIS — M7610 Psoas tendinitis, unspecified hip: Secondary | ICD-10-CM | POA: Diagnosis not present

## 2023-03-04 DIAGNOSIS — M542 Cervicalgia: Secondary | ICD-10-CM | POA: Diagnosis not present

## 2023-03-04 DIAGNOSIS — M5137 Other intervertebral disc degeneration, lumbosacral region: Secondary | ICD-10-CM | POA: Diagnosis not present

## 2023-03-18 DIAGNOSIS — I10 Essential (primary) hypertension: Secondary | ICD-10-CM | POA: Diagnosis not present

## 2023-03-18 DIAGNOSIS — Z124 Encounter for screening for malignant neoplasm of cervix: Secondary | ICD-10-CM | POA: Diagnosis not present

## 2023-03-18 DIAGNOSIS — E1165 Type 2 diabetes mellitus with hyperglycemia: Secondary | ICD-10-CM | POA: Diagnosis not present

## 2023-03-25 DIAGNOSIS — M1711 Unilateral primary osteoarthritis, right knee: Secondary | ICD-10-CM | POA: Diagnosis not present

## 2023-03-25 DIAGNOSIS — M25561 Pain in right knee: Secondary | ICD-10-CM | POA: Diagnosis not present

## 2023-04-01 DIAGNOSIS — G890 Central pain syndrome: Secondary | ICD-10-CM | POA: Diagnosis not present

## 2023-04-01 DIAGNOSIS — M25561 Pain in right knee: Secondary | ICD-10-CM | POA: Diagnosis not present

## 2023-04-01 DIAGNOSIS — M545 Low back pain, unspecified: Secondary | ICD-10-CM | POA: Diagnosis not present

## 2023-04-01 DIAGNOSIS — Z79891 Long term (current) use of opiate analgesic: Secondary | ICD-10-CM | POA: Diagnosis not present

## 2023-04-01 DIAGNOSIS — M7610 Psoas tendinitis, unspecified hip: Secondary | ICD-10-CM | POA: Diagnosis not present

## 2023-04-01 DIAGNOSIS — M4628 Osteomyelitis of vertebra, sacral and sacrococcygeal region: Secondary | ICD-10-CM | POA: Diagnosis not present

## 2023-04-01 DIAGNOSIS — M5137 Other intervertebral disc degeneration, lumbosacral region: Secondary | ICD-10-CM | POA: Diagnosis not present

## 2023-04-01 DIAGNOSIS — G894 Chronic pain syndrome: Secondary | ICD-10-CM | POA: Diagnosis not present

## 2023-04-01 DIAGNOSIS — M542 Cervicalgia: Secondary | ICD-10-CM | POA: Diagnosis not present

## 2023-04-29 DIAGNOSIS — M25561 Pain in right knee: Secondary | ICD-10-CM | POA: Diagnosis not present

## 2023-04-29 DIAGNOSIS — M1711 Unilateral primary osteoarthritis, right knee: Secondary | ICD-10-CM | POA: Diagnosis not present

## 2023-05-10 DIAGNOSIS — M25561 Pain in right knee: Secondary | ICD-10-CM | POA: Diagnosis not present

## 2023-05-10 DIAGNOSIS — G890 Central pain syndrome: Secondary | ICD-10-CM | POA: Diagnosis not present

## 2023-05-10 DIAGNOSIS — M4628 Osteomyelitis of vertebra, sacral and sacrococcygeal region: Secondary | ICD-10-CM | POA: Diagnosis not present

## 2023-05-10 DIAGNOSIS — M542 Cervicalgia: Secondary | ICD-10-CM | POA: Diagnosis not present

## 2023-05-10 DIAGNOSIS — M7610 Psoas tendinitis, unspecified hip: Secondary | ICD-10-CM | POA: Diagnosis not present

## 2023-05-10 DIAGNOSIS — M545 Low back pain, unspecified: Secondary | ICD-10-CM | POA: Diagnosis not present

## 2023-05-10 DIAGNOSIS — M5137 Other intervertebral disc degeneration, lumbosacral region: Secondary | ICD-10-CM | POA: Diagnosis not present

## 2023-05-10 DIAGNOSIS — Z79891 Long term (current) use of opiate analgesic: Secondary | ICD-10-CM | POA: Diagnosis not present

## 2023-05-10 DIAGNOSIS — G894 Chronic pain syndrome: Secondary | ICD-10-CM | POA: Diagnosis not present

## 2023-05-26 ENCOUNTER — Encounter: Payer: Self-pay | Admitting: Physician Assistant

## 2023-06-09 DIAGNOSIS — M25561 Pain in right knee: Secondary | ICD-10-CM | POA: Diagnosis not present

## 2023-06-09 DIAGNOSIS — M7610 Psoas tendinitis, unspecified hip: Secondary | ICD-10-CM | POA: Diagnosis not present

## 2023-06-09 DIAGNOSIS — M5137 Other intervertebral disc degeneration, lumbosacral region with discogenic back pain only: Secondary | ICD-10-CM | POA: Diagnosis not present

## 2023-06-09 DIAGNOSIS — Z79891 Long term (current) use of opiate analgesic: Secondary | ICD-10-CM | POA: Diagnosis not present

## 2023-06-09 DIAGNOSIS — G890 Central pain syndrome: Secondary | ICD-10-CM | POA: Diagnosis not present

## 2023-06-09 DIAGNOSIS — M545 Low back pain, unspecified: Secondary | ICD-10-CM | POA: Diagnosis not present

## 2023-06-09 DIAGNOSIS — G894 Chronic pain syndrome: Secondary | ICD-10-CM | POA: Diagnosis not present

## 2023-06-09 DIAGNOSIS — M4628 Osteomyelitis of vertebra, sacral and sacrococcygeal region: Secondary | ICD-10-CM | POA: Diagnosis not present

## 2023-06-09 DIAGNOSIS — M542 Cervicalgia: Secondary | ICD-10-CM | POA: Diagnosis not present

## 2023-06-17 DIAGNOSIS — R051 Acute cough: Secondary | ICD-10-CM | POA: Diagnosis not present

## 2023-06-17 DIAGNOSIS — J0100 Acute maxillary sinusitis, unspecified: Secondary | ICD-10-CM | POA: Diagnosis not present

## 2023-07-07 DIAGNOSIS — M542 Cervicalgia: Secondary | ICD-10-CM | POA: Diagnosis not present

## 2023-07-07 DIAGNOSIS — M7610 Psoas tendinitis, unspecified hip: Secondary | ICD-10-CM | POA: Diagnosis not present

## 2023-07-07 DIAGNOSIS — M25561 Pain in right knee: Secondary | ICD-10-CM | POA: Diagnosis not present

## 2023-07-07 DIAGNOSIS — G894 Chronic pain syndrome: Secondary | ICD-10-CM | POA: Diagnosis not present

## 2023-07-07 DIAGNOSIS — M4628 Osteomyelitis of vertebra, sacral and sacrococcygeal region: Secondary | ICD-10-CM | POA: Diagnosis not present

## 2023-07-07 DIAGNOSIS — M5137 Other intervertebral disc degeneration, lumbosacral region with discogenic back pain only: Secondary | ICD-10-CM | POA: Diagnosis not present

## 2023-07-07 DIAGNOSIS — Z79891 Long term (current) use of opiate analgesic: Secondary | ICD-10-CM | POA: Diagnosis not present

## 2023-08-04 DIAGNOSIS — M7610 Psoas tendinitis, unspecified hip: Secondary | ICD-10-CM | POA: Diagnosis not present

## 2023-08-04 DIAGNOSIS — M4628 Osteomyelitis of vertebra, sacral and sacrococcygeal region: Secondary | ICD-10-CM | POA: Diagnosis not present

## 2023-08-04 DIAGNOSIS — M25561 Pain in right knee: Secondary | ICD-10-CM | POA: Diagnosis not present

## 2023-08-04 DIAGNOSIS — G894 Chronic pain syndrome: Secondary | ICD-10-CM | POA: Diagnosis not present

## 2023-08-04 DIAGNOSIS — Z79891 Long term (current) use of opiate analgesic: Secondary | ICD-10-CM | POA: Diagnosis not present

## 2023-08-08 DIAGNOSIS — Z1231 Encounter for screening mammogram for malignant neoplasm of breast: Secondary | ICD-10-CM | POA: Diagnosis not present

## 2023-08-09 DIAGNOSIS — Z0001 Encounter for general adult medical examination with abnormal findings: Secondary | ICD-10-CM | POA: Diagnosis not present

## 2023-08-09 DIAGNOSIS — Z2989 Encounter for other specified prophylactic measures: Secondary | ICD-10-CM | POA: Diagnosis not present

## 2023-08-09 DIAGNOSIS — E559 Vitamin D deficiency, unspecified: Secondary | ICD-10-CM | POA: Diagnosis not present

## 2023-08-09 DIAGNOSIS — E782 Mixed hyperlipidemia: Secondary | ICD-10-CM | POA: Diagnosis not present

## 2023-08-09 DIAGNOSIS — I10 Essential (primary) hypertension: Secondary | ICD-10-CM | POA: Diagnosis not present

## 2023-08-09 DIAGNOSIS — E1165 Type 2 diabetes mellitus with hyperglycemia: Secondary | ICD-10-CM | POA: Diagnosis not present

## 2023-08-09 DIAGNOSIS — G894 Chronic pain syndrome: Secondary | ICD-10-CM | POA: Diagnosis not present

## 2023-08-09 DIAGNOSIS — M170 Bilateral primary osteoarthritis of knee: Secondary | ICD-10-CM | POA: Diagnosis not present

## 2023-08-09 DIAGNOSIS — K219 Gastro-esophageal reflux disease without esophagitis: Secondary | ICD-10-CM | POA: Diagnosis not present

## 2023-08-12 DIAGNOSIS — M5137 Other intervertebral disc degeneration, lumbosacral region with discogenic back pain only: Secondary | ICD-10-CM | POA: Diagnosis not present

## 2023-08-12 DIAGNOSIS — M545 Low back pain, unspecified: Secondary | ICD-10-CM | POA: Diagnosis not present

## 2023-08-12 DIAGNOSIS — M47817 Spondylosis without myelopathy or radiculopathy, lumbosacral region: Secondary | ICD-10-CM | POA: Diagnosis not present

## 2023-08-12 DIAGNOSIS — M4628 Osteomyelitis of vertebra, sacral and sacrococcygeal region: Secondary | ICD-10-CM | POA: Diagnosis not present

## 2023-08-25 DIAGNOSIS — M170 Bilateral primary osteoarthritis of knee: Secondary | ICD-10-CM | POA: Diagnosis not present

## 2023-08-25 DIAGNOSIS — E1165 Type 2 diabetes mellitus with hyperglycemia: Secondary | ICD-10-CM | POA: Diagnosis not present

## 2023-08-25 DIAGNOSIS — E782 Mixed hyperlipidemia: Secondary | ICD-10-CM | POA: Diagnosis not present

## 2023-08-25 DIAGNOSIS — N3001 Acute cystitis with hematuria: Secondary | ICD-10-CM | POA: Diagnosis not present

## 2023-08-25 DIAGNOSIS — K219 Gastro-esophageal reflux disease without esophagitis: Secondary | ICD-10-CM | POA: Diagnosis not present

## 2023-08-25 DIAGNOSIS — E559 Vitamin D deficiency, unspecified: Secondary | ICD-10-CM | POA: Diagnosis not present

## 2023-08-25 DIAGNOSIS — D708 Other neutropenia: Secondary | ICD-10-CM | POA: Diagnosis not present

## 2023-08-25 DIAGNOSIS — I10 Essential (primary) hypertension: Secondary | ICD-10-CM | POA: Diagnosis not present

## 2023-08-25 DIAGNOSIS — G894 Chronic pain syndrome: Secondary | ICD-10-CM | POA: Diagnosis not present

## 2023-08-25 DIAGNOSIS — Z2989 Encounter for other specified prophylactic measures: Secondary | ICD-10-CM | POA: Diagnosis not present

## 2023-08-25 DIAGNOSIS — N289 Disorder of kidney and ureter, unspecified: Secondary | ICD-10-CM | POA: Diagnosis not present

## 2023-08-26 DIAGNOSIS — Z79891 Long term (current) use of opiate analgesic: Secondary | ICD-10-CM | POA: Diagnosis not present

## 2023-08-26 DIAGNOSIS — G890 Central pain syndrome: Secondary | ICD-10-CM | POA: Diagnosis not present

## 2023-08-26 DIAGNOSIS — M542 Cervicalgia: Secondary | ICD-10-CM | POA: Diagnosis not present

## 2023-08-26 DIAGNOSIS — G894 Chronic pain syndrome: Secondary | ICD-10-CM | POA: Diagnosis not present

## 2023-08-26 DIAGNOSIS — M5137 Other intervertebral disc degeneration, lumbosacral region with discogenic back pain only: Secondary | ICD-10-CM | POA: Diagnosis not present

## 2023-09-23 DIAGNOSIS — M7610 Psoas tendinitis, unspecified hip: Secondary | ICD-10-CM | POA: Diagnosis not present

## 2023-09-23 DIAGNOSIS — M545 Low back pain, unspecified: Secondary | ICD-10-CM | POA: Diagnosis not present

## 2023-09-23 DIAGNOSIS — G890 Central pain syndrome: Secondary | ICD-10-CM | POA: Diagnosis not present

## 2023-10-26 DIAGNOSIS — M7610 Psoas tendinitis, unspecified hip: Secondary | ICD-10-CM | POA: Diagnosis not present

## 2023-10-26 DIAGNOSIS — M542 Cervicalgia: Secondary | ICD-10-CM | POA: Diagnosis not present

## 2023-10-26 DIAGNOSIS — M545 Low back pain, unspecified: Secondary | ICD-10-CM | POA: Diagnosis not present

## 2023-12-14 DIAGNOSIS — D708 Other neutropenia: Secondary | ICD-10-CM | POA: Diagnosis not present

## 2023-12-14 DIAGNOSIS — K219 Gastro-esophageal reflux disease without esophagitis: Secondary | ICD-10-CM | POA: Diagnosis not present

## 2023-12-14 DIAGNOSIS — E782 Mixed hyperlipidemia: Secondary | ICD-10-CM | POA: Diagnosis not present

## 2023-12-14 DIAGNOSIS — E559 Vitamin D deficiency, unspecified: Secondary | ICD-10-CM | POA: Diagnosis not present

## 2023-12-14 DIAGNOSIS — G894 Chronic pain syndrome: Secondary | ICD-10-CM | POA: Diagnosis not present

## 2023-12-14 DIAGNOSIS — M170 Bilateral primary osteoarthritis of knee: Secondary | ICD-10-CM | POA: Diagnosis not present

## 2023-12-14 DIAGNOSIS — I10 Essential (primary) hypertension: Secondary | ICD-10-CM | POA: Diagnosis not present

## 2023-12-14 DIAGNOSIS — E1165 Type 2 diabetes mellitus with hyperglycemia: Secondary | ICD-10-CM | POA: Diagnosis not present

## 2023-12-18 IMAGING — CR DG LUMBAR SPINE 2-3V
3 series · 3 of 3 positions shown · non-contrast
Comparison: MRI 08/02/2013; CT 02/19/2010. PET-CT 06/09/2007.

CLINICAL DATA: Low back pain radiating down right leg to foot for
several years.

EXAM:
LUMBAR SPINE - 2-3 VIEW

[w lumbar spine ap]
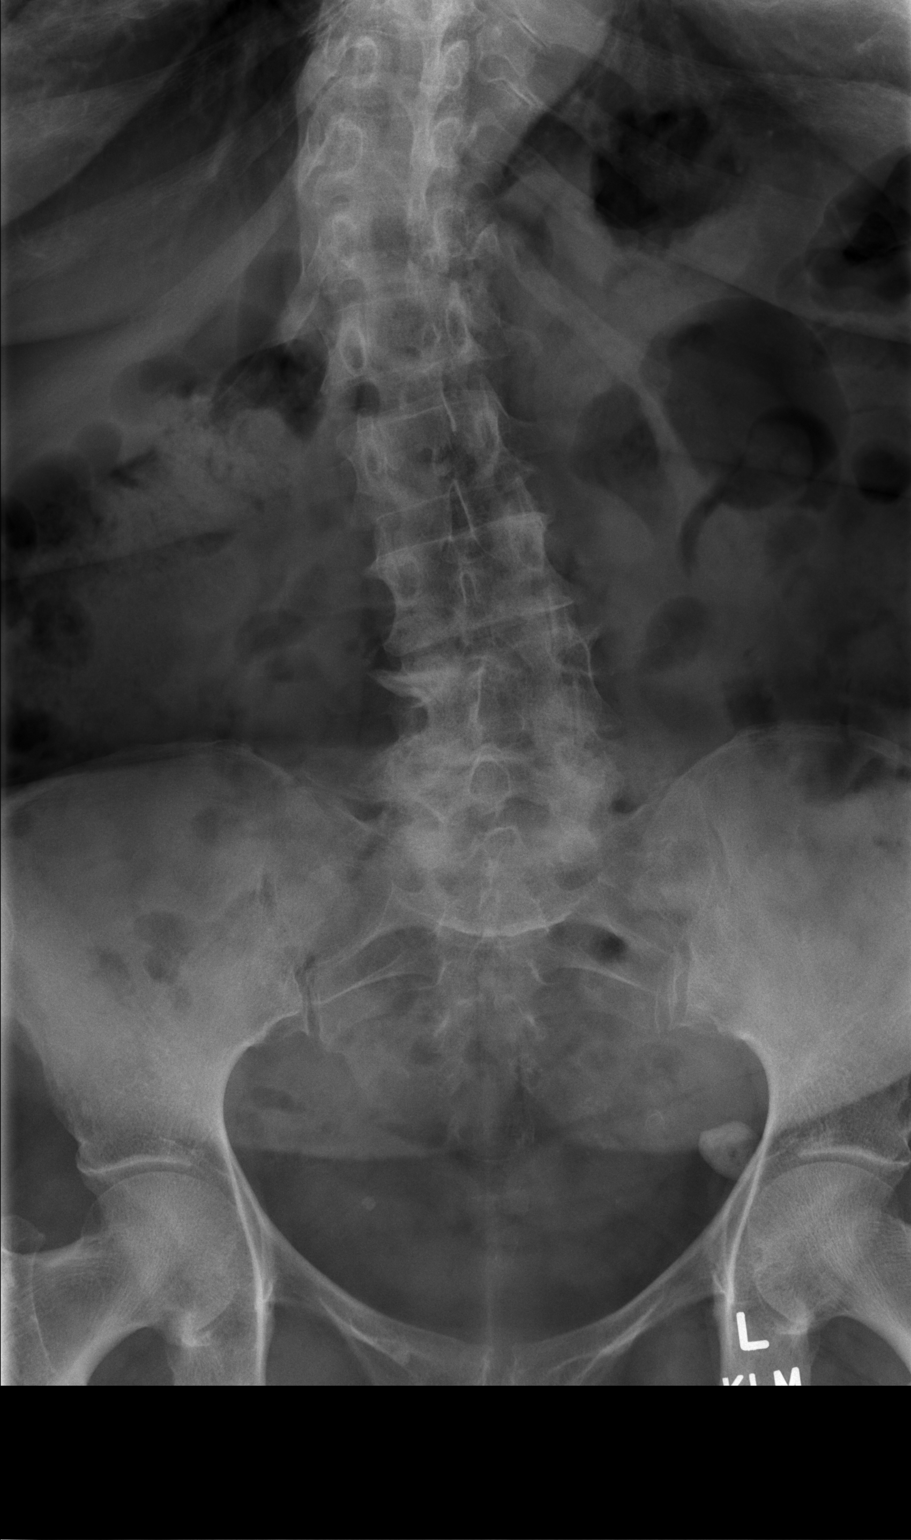

[w lumbar spine lat]
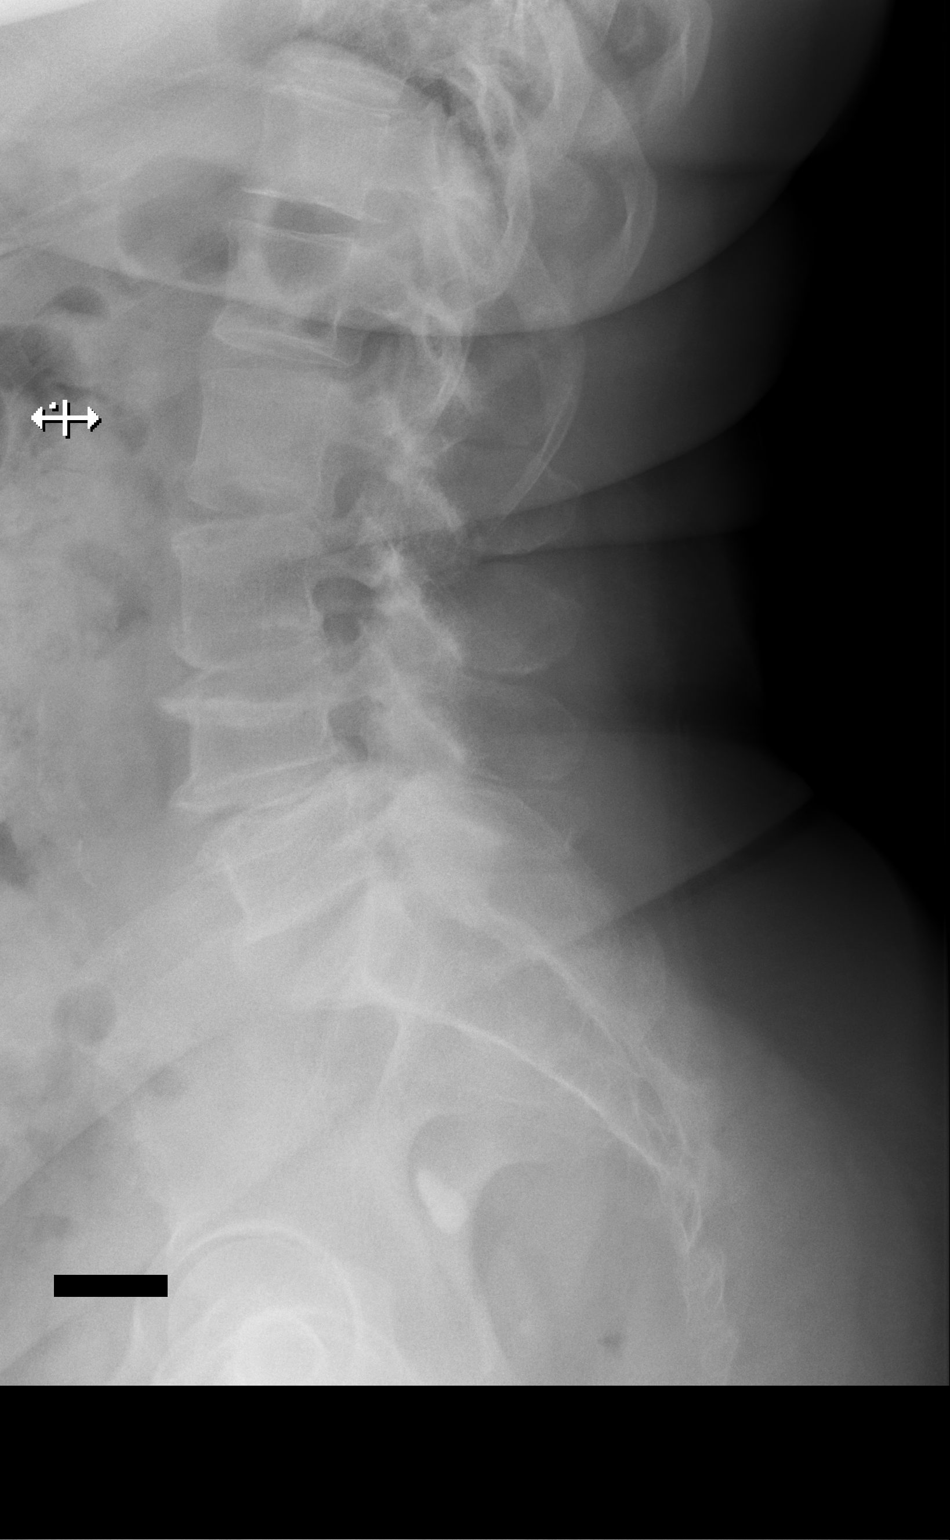

[w lumbar l-5 s-1 spot]
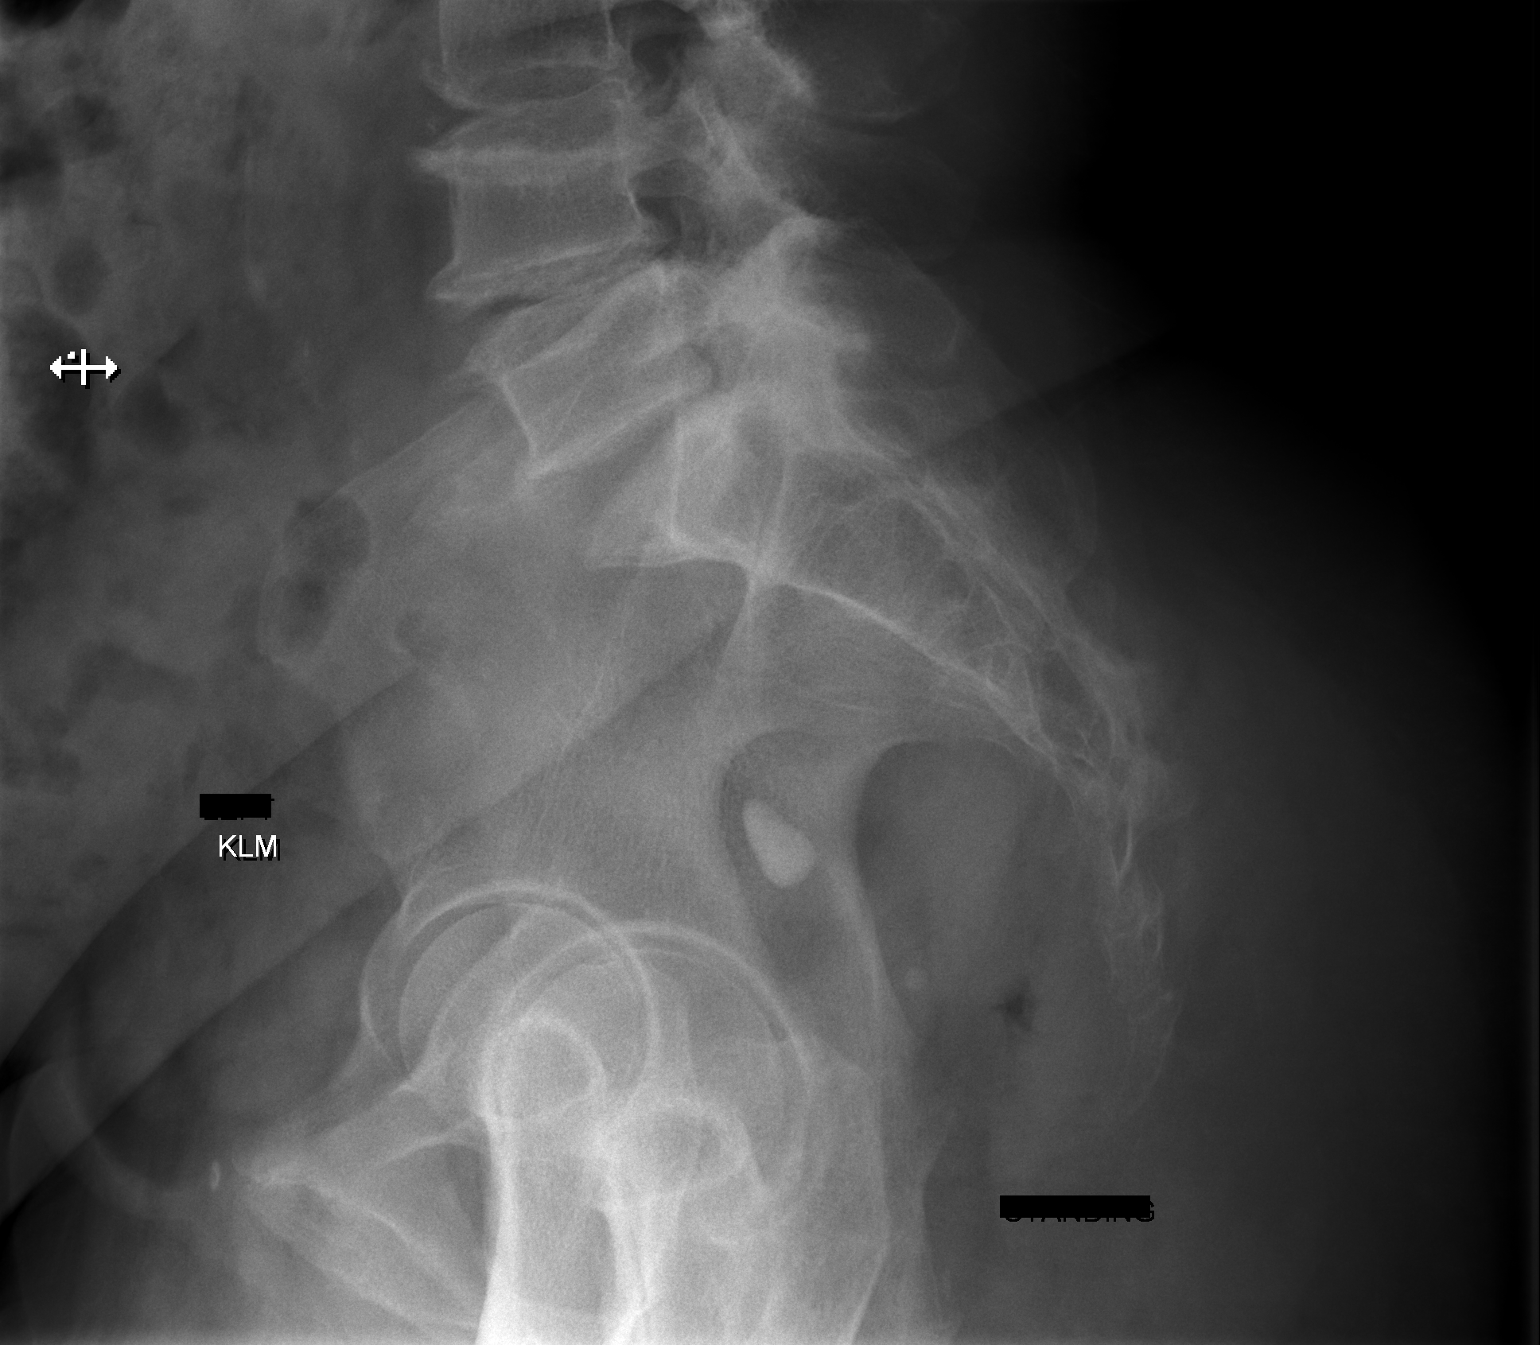

[3 of 3 positions shown; findings below may reference images not displayed]

FINDINGS: There is mild levoconvex curvature of the lumbar spine centered at
L4. There is no evidence for acute fracture or dislocation.
Alignment appears anatomic. There is moderate disc space narrowing
at L2-L3, L3-L4 and L4-L5 with endplate osteophyte formation
compatible with degenerative change. Mild degenerative endplate
changes are seen at L5-S1. Soft tissue calcification in the left
hemipelvis is rounded measuring 14 mm, stable from prior.
IMPRESSION: 1. No acute fracture or dislocation.
2. Moderate multilevel degenerative changes.

## 2023-12-29 DIAGNOSIS — I10 Essential (primary) hypertension: Secondary | ICD-10-CM | POA: Diagnosis not present

## 2023-12-29 DIAGNOSIS — G894 Chronic pain syndrome: Secondary | ICD-10-CM | POA: Diagnosis not present

## 2023-12-29 DIAGNOSIS — M170 Bilateral primary osteoarthritis of knee: Secondary | ICD-10-CM | POA: Diagnosis not present

## 2023-12-29 DIAGNOSIS — E782 Mixed hyperlipidemia: Secondary | ICD-10-CM | POA: Diagnosis not present

## 2023-12-29 DIAGNOSIS — K219 Gastro-esophageal reflux disease without esophagitis: Secondary | ICD-10-CM | POA: Diagnosis not present

## 2023-12-29 DIAGNOSIS — E1165 Type 2 diabetes mellitus with hyperglycemia: Secondary | ICD-10-CM | POA: Diagnosis not present

## 2023-12-29 DIAGNOSIS — D708 Other neutropenia: Secondary | ICD-10-CM | POA: Diagnosis not present

## 2023-12-29 DIAGNOSIS — E559 Vitamin D deficiency, unspecified: Secondary | ICD-10-CM | POA: Diagnosis not present

## 2023-12-29 DIAGNOSIS — R058 Other specified cough: Secondary | ICD-10-CM | POA: Diagnosis not present

## 2024-01-06 DIAGNOSIS — M7610 Psoas tendinitis, unspecified hip: Secondary | ICD-10-CM | POA: Diagnosis not present

## 2024-01-06 DIAGNOSIS — M545 Low back pain, unspecified: Secondary | ICD-10-CM | POA: Diagnosis not present

## 2024-01-06 DIAGNOSIS — G890 Central pain syndrome: Secondary | ICD-10-CM | POA: Diagnosis not present

## 2024-01-06 DIAGNOSIS — G894 Chronic pain syndrome: Secondary | ICD-10-CM | POA: Diagnosis not present

## 2024-01-06 DIAGNOSIS — Z79891 Long term (current) use of opiate analgesic: Secondary | ICD-10-CM | POA: Diagnosis not present

## 2024-02-03 DIAGNOSIS — M4628 Osteomyelitis of vertebra, sacral and sacrococcygeal region: Secondary | ICD-10-CM | POA: Diagnosis not present

## 2024-02-03 DIAGNOSIS — M25561 Pain in right knee: Secondary | ICD-10-CM | POA: Diagnosis not present

## 2024-02-03 DIAGNOSIS — G894 Chronic pain syndrome: Secondary | ICD-10-CM | POA: Diagnosis not present

## 2024-02-03 DIAGNOSIS — M47817 Spondylosis without myelopathy or radiculopathy, lumbosacral region: Secondary | ICD-10-CM | POA: Diagnosis not present

## 2024-02-03 DIAGNOSIS — M1711 Unilateral primary osteoarthritis, right knee: Secondary | ICD-10-CM | POA: Diagnosis not present

## 2024-02-03 DIAGNOSIS — Z79891 Long term (current) use of opiate analgesic: Secondary | ICD-10-CM | POA: Diagnosis not present

## 2024-03-02 DIAGNOSIS — M4628 Osteomyelitis of vertebra, sacral and sacrococcygeal region: Secondary | ICD-10-CM | POA: Diagnosis not present

## 2024-03-02 DIAGNOSIS — Z79891 Long term (current) use of opiate analgesic: Secondary | ICD-10-CM | POA: Diagnosis not present

## 2024-03-02 DIAGNOSIS — M545 Low back pain, unspecified: Secondary | ICD-10-CM | POA: Diagnosis not present

## 2024-03-02 DIAGNOSIS — M7610 Psoas tendinitis, unspecified hip: Secondary | ICD-10-CM | POA: Diagnosis not present

## 2024-03-02 DIAGNOSIS — G894 Chronic pain syndrome: Secondary | ICD-10-CM | POA: Diagnosis not present

## 2024-03-15 DIAGNOSIS — M25511 Pain in right shoulder: Secondary | ICD-10-CM | POA: Diagnosis not present

## 2024-03-16 DIAGNOSIS — E782 Mixed hyperlipidemia: Secondary | ICD-10-CM | POA: Diagnosis not present

## 2024-03-16 DIAGNOSIS — R058 Other specified cough: Secondary | ICD-10-CM | POA: Diagnosis not present

## 2024-03-16 DIAGNOSIS — M170 Bilateral primary osteoarthritis of knee: Secondary | ICD-10-CM | POA: Diagnosis not present

## 2024-03-16 DIAGNOSIS — E1165 Type 2 diabetes mellitus with hyperglycemia: Secondary | ICD-10-CM | POA: Diagnosis not present

## 2024-03-16 DIAGNOSIS — D708 Other neutropenia: Secondary | ICD-10-CM | POA: Diagnosis not present

## 2024-03-16 DIAGNOSIS — K219 Gastro-esophageal reflux disease without esophagitis: Secondary | ICD-10-CM | POA: Diagnosis not present

## 2024-03-16 DIAGNOSIS — R053 Chronic cough: Secondary | ICD-10-CM | POA: Diagnosis not present

## 2024-03-16 DIAGNOSIS — I10 Essential (primary) hypertension: Secondary | ICD-10-CM | POA: Diagnosis not present

## 2024-03-16 DIAGNOSIS — R5383 Other fatigue: Secondary | ICD-10-CM | POA: Diagnosis not present

## 2024-03-16 DIAGNOSIS — E559 Vitamin D deficiency, unspecified: Secondary | ICD-10-CM | POA: Diagnosis not present

## 2024-03-16 DIAGNOSIS — G894 Chronic pain syndrome: Secondary | ICD-10-CM | POA: Diagnosis not present

## 2024-03-23 DIAGNOSIS — M25511 Pain in right shoulder: Secondary | ICD-10-CM | POA: Diagnosis not present

## 2024-03-26 DIAGNOSIS — D696 Thrombocytopenia, unspecified: Secondary | ICD-10-CM | POA: Diagnosis not present

## 2024-03-26 DIAGNOSIS — K219 Gastro-esophageal reflux disease without esophagitis: Secondary | ICD-10-CM | POA: Diagnosis not present

## 2024-03-26 DIAGNOSIS — J302 Other seasonal allergic rhinitis: Secondary | ICD-10-CM | POA: Diagnosis not present

## 2024-03-26 DIAGNOSIS — M170 Bilateral primary osteoarthritis of knee: Secondary | ICD-10-CM | POA: Diagnosis not present

## 2024-03-26 DIAGNOSIS — D708 Other neutropenia: Secondary | ICD-10-CM | POA: Diagnosis not present

## 2024-03-26 DIAGNOSIS — R7989 Other specified abnormal findings of blood chemistry: Secondary | ICD-10-CM | POA: Diagnosis not present

## 2024-03-26 DIAGNOSIS — I10 Essential (primary) hypertension: Secondary | ICD-10-CM | POA: Diagnosis not present

## 2024-03-26 DIAGNOSIS — E559 Vitamin D deficiency, unspecified: Secondary | ICD-10-CM | POA: Diagnosis not present

## 2024-03-26 DIAGNOSIS — E1165 Type 2 diabetes mellitus with hyperglycemia: Secondary | ICD-10-CM | POA: Diagnosis not present

## 2024-03-26 DIAGNOSIS — G894 Chronic pain syndrome: Secondary | ICD-10-CM | POA: Diagnosis not present

## 2024-03-26 DIAGNOSIS — E782 Mixed hyperlipidemia: Secondary | ICD-10-CM | POA: Diagnosis not present

## 2024-03-30 DIAGNOSIS — G894 Chronic pain syndrome: Secondary | ICD-10-CM | POA: Diagnosis not present

## 2024-03-30 DIAGNOSIS — M25561 Pain in right knee: Secondary | ICD-10-CM | POA: Diagnosis not present

## 2024-03-30 DIAGNOSIS — M5137 Other intervertebral disc degeneration, lumbosacral region with discogenic back pain only: Secondary | ICD-10-CM | POA: Diagnosis not present

## 2024-03-30 DIAGNOSIS — M47817 Spondylosis without myelopathy or radiculopathy, lumbosacral region: Secondary | ICD-10-CM | POA: Diagnosis not present

## 2024-03-30 DIAGNOSIS — Z79891 Long term (current) use of opiate analgesic: Secondary | ICD-10-CM | POA: Diagnosis not present

## 2024-03-30 DIAGNOSIS — M1711 Unilateral primary osteoarthritis, right knee: Secondary | ICD-10-CM | POA: Diagnosis not present

## 2024-04-03 DIAGNOSIS — E66811 Obesity, class 1: Secondary | ICD-10-CM | POA: Diagnosis not present

## 2024-04-03 DIAGNOSIS — G4730 Sleep apnea, unspecified: Secondary | ICD-10-CM | POA: Diagnosis not present

## 2024-04-03 DIAGNOSIS — I10 Essential (primary) hypertension: Secondary | ICD-10-CM | POA: Diagnosis not present

## 2024-04-20 DIAGNOSIS — M25511 Pain in right shoulder: Secondary | ICD-10-CM | POA: Diagnosis not present

## 2024-04-24 DIAGNOSIS — E559 Vitamin D deficiency, unspecified: Secondary | ICD-10-CM | POA: Diagnosis not present

## 2024-04-24 DIAGNOSIS — K219 Gastro-esophageal reflux disease without esophagitis: Secondary | ICD-10-CM | POA: Diagnosis not present

## 2024-04-24 DIAGNOSIS — E782 Mixed hyperlipidemia: Secondary | ICD-10-CM | POA: Diagnosis not present

## 2024-04-24 DIAGNOSIS — I10 Essential (primary) hypertension: Secondary | ICD-10-CM | POA: Diagnosis not present

## 2024-04-24 DIAGNOSIS — J302 Other seasonal allergic rhinitis: Secondary | ICD-10-CM | POA: Diagnosis not present

## 2024-04-24 DIAGNOSIS — E1165 Type 2 diabetes mellitus with hyperglycemia: Secondary | ICD-10-CM | POA: Diagnosis not present

## 2024-04-24 DIAGNOSIS — M170 Bilateral primary osteoarthritis of knee: Secondary | ICD-10-CM | POA: Diagnosis not present

## 2024-04-24 DIAGNOSIS — G894 Chronic pain syndrome: Secondary | ICD-10-CM | POA: Diagnosis not present

## 2024-04-27 DIAGNOSIS — M4628 Osteomyelitis of vertebra, sacral and sacrococcygeal region: Secondary | ICD-10-CM | POA: Diagnosis not present

## 2024-04-27 DIAGNOSIS — M25561 Pain in right knee: Secondary | ICD-10-CM | POA: Diagnosis not present

## 2024-04-27 DIAGNOSIS — M545 Low back pain, unspecified: Secondary | ICD-10-CM | POA: Diagnosis not present

## 2024-05-07 ENCOUNTER — Inpatient Hospital Stay

## 2024-05-07 ENCOUNTER — Inpatient Hospital Stay: Attending: Hematology and Oncology | Admitting: Hematology and Oncology

## 2024-05-07 ENCOUNTER — Encounter: Payer: Self-pay | Admitting: Hematology and Oncology

## 2024-05-07 VITALS — BP 153/77 | HR 76 | Temp 97.8°F | Resp 18 | Ht 64.0 in | Wt 179.4 lb

## 2024-05-07 DIAGNOSIS — D61818 Other pancytopenia: Secondary | ICD-10-CM | POA: Insufficient documentation

## 2024-05-07 LAB — CBC WITH DIFFERENTIAL (CANCER CENTER ONLY)
Abs Immature Granulocytes: 0 K/uL (ref 0.00–0.07)
Basophils Absolute: 0 K/uL (ref 0.0–0.1)
Basophils Relative: 0 %
Eosinophils Absolute: 0.1 K/uL (ref 0.0–0.5)
Eosinophils Relative: 2 %
HCT: 31.3 % — ABNORMAL LOW (ref 36.0–46.0)
Hemoglobin: 10.7 g/dL — ABNORMAL LOW (ref 12.0–15.0)
Immature Granulocytes: 0 %
Lymphocytes Relative: 44 %
Lymphs Abs: 1.5 K/uL (ref 0.7–4.0)
MCH: 29.3 pg (ref 26.0–34.0)
MCHC: 34.2 g/dL (ref 30.0–36.0)
MCV: 85.8 fL (ref 80.0–100.0)
Monocytes Absolute: 0.4 K/uL (ref 0.1–1.0)
Monocytes Relative: 12 %
Neutro Abs: 1.4 K/uL — ABNORMAL LOW (ref 1.7–7.7)
Neutrophils Relative %: 42 %
Platelet Count: 62 K/uL — ABNORMAL LOW (ref 150–400)
RBC: 3.65 MIL/uL — ABNORMAL LOW (ref 3.87–5.11)
RDW: 14.3 % (ref 11.5–15.5)
WBC Count: 3.4 K/uL — ABNORMAL LOW (ref 4.0–10.5)
nRBC: 0 % (ref 0.0–0.2)

## 2024-05-07 LAB — CMP (CANCER CENTER ONLY)
ALT: 14 U/L (ref 0–44)
AST: 17 U/L (ref 15–41)
Albumin: 4 g/dL (ref 3.5–5.0)
Alkaline Phosphatase: 61 U/L (ref 38–126)
Anion gap: 3 — ABNORMAL LOW (ref 5–15)
BUN: 15 mg/dL (ref 6–20)
CO2: 31 mmol/L (ref 22–32)
Calcium: 9.5 mg/dL (ref 8.9–10.3)
Chloride: 106 mmol/L (ref 98–111)
Creatinine: 1.03 mg/dL — ABNORMAL HIGH (ref 0.44–1.00)
GFR, Estimated: >60 mL/min (ref >60)
Glucose, Bld: 158 mg/dL — ABNORMAL HIGH (ref 70–99)
Potassium: 3.8 mmol/L (ref 3.5–5.1)
Sodium: 140 mmol/L (ref 135–145)
Total Bilirubin: 0.5 mg/dL (ref 0.0–1.2)
Total Protein: 7.5 g/dL (ref 6.5–8.1)

## 2024-05-07 LAB — VITAMIN B12: Vitamin B-12: >4000 pg/mL — ABNORMAL HIGH (ref 180–914)

## 2024-05-07 LAB — SEDIMENTATION RATE: Sed Rate: 3 mm/h (ref 0–22)

## 2024-05-07 NOTE — Progress Notes (Signed)
 [name]  Patient Care Team: Vanessa Bare, MD as PCP - General (Internal Medicine) Vanessa Nancyann SQUIBB, MD (Thoracic Surgery) Vanessa Ade, MD (Inactive) (Cardiology)  ASSESSMENT & PLAN Other pancytopenia [facility]) She had extensive evaluation in 2021 with another hematologist and no definitive causes of leukopenia and thrombocytopenia was found Subsequently, his care was transferred to [facility] again with no definitive cause found I will order repeat labs today I suspect the patient might have hepatosplenomegaly causing sequestration If labs are unrevealing, my next that would be to order CT imaging for further assessment  Orders Placed This Encounter  Procedures   CMP (Cancer Center only)    Standing Status:   Future    Number of Occurrences:   1    Expiration Date:   05/07/2025   CBC with Differential (Cancer Center Only)    Standing Status:   Future    Number of Occurrences:   1    Expiration Date:   05/07/2025   Vitamin B12    Standing Status:   Future    Number of Occurrences:   1    Expiration Date:   05/07/2025   Sedimentation rate    Standing Status:   Future    Number of Occurrences:   1    Expiration Date:   05/07/2025   Kappa/lambda light chains    Standing Status:   Future    Number of Occurrences:   1    Expiration Date:   05/07/2025   Multiple Myeloma Panel (SPEP&IFE w/QIG)    Standing Status:   Future    Number of Occurrences:   1    Expiration Date:   05/07/2025   Copper , serum    Standing Status:   Future    Number of Occurrences:   1    Expiration Date:   05/07/2025   ANA, IFA (with reflex)    Standing Status:   Future    Number of Occurrences:   1    Expiration Date:   05/07/2025   All questions were answered. The patient knows to call the clinic with any problems, questions or concerns. No barriers to learning was detected. The total time spent in the appointment was 55 minutes encounter with patients  including review of chart and various tests results, discussions about plan of care and coordination of care plan  Vanessa Bedford, MD 05/07/2024 1:26 PM  CHIEF COMPLAINTS/PURPOSE OF CONSULTATION:  Chronic pancytopenia  HISTORY OF PRESENTING ILLNESS:  [name] 58 y.o. female is here because of thrombocytopenia.  She was found to have abnormal CBC from abnormal blood work I have the opportunity to review her historic CBC She was originally evaluated by [name] in 2020 for similar reason Extensive workup was done, the only positive test was that the patient tested positive for sickle cell trait.  Test for autoimmune disease, hepatitis and HIV were negative  On May 21, 2007, white blood cell count was 10.2, hemoglobin 13.6 and platelet count 153 By May 23, 2007, white blood cell count was 6.7, hemoglobin 12.7 and platelet count 125 On August 01, 2013, white count was 6.5, hemoglobin 12.9 and platelet count was 78 On January 26, 2018, white count 8.8, hemoglobin was 10 and platelet count 138 On February 26, 2018, white count 12.7, hemoglobin 12.1 and platelet count 187 On February 02, 2019, white count 4.0, hemoglobin 10.8 and platelet count 89 On May 11, 2019, white count 4.0, hemoglobin 11.9  and platelet count 97 On August 10, 2019, white count 3.6, hemoglobin 11.4 and platelet count 98 On December 06, 2019, white count 3.1, hemoglobin 11.8 and platelet count 87 On June 05, 2020, hemoglobin 4.3, hemoglobin 12.7 and platelet count 92 On March 16, 2024, white count 2.9, hemoglobin 12.7 and platelet count 102 She denies recent bruising/bleeding, such as spontaneous epistaxis, hematuria, melena or hematochezia She had prior heart catheterization and the cardiac murmur The patient denies history of liver disease or recent new medications She denies prior blood or platelet transfusions   MEDICAL HISTORY:  Past Medical History:  Diagnosis Date   Arthritis 2008   Lumbar Deg Disc  Disease   Chest pain    Diabetes mellitus without complication (HCC)    GERD (gastroesophageal reflux disease)    HTN (hypertension)    Hyperlipidemia associated with type 2 diabetes mellitus (HCC)    Low vitamin D level    Migraine    Sickle cell trait (HCC)    Thymic cyst (HCC)     SURGICAL HISTORY: Past Surgical History:  Procedure Laterality Date   CARDIAC CATHETERIZATION     COLONOSCOPY      SOCIAL HISTORY: Social History   Socioeconomic History   Marital status: Married    Spouse name: Not on file   Number of children: 4   Years of education: Not on file   Highest education level: Not on file  Occupational History   Occupation: not working  Tobacco Use   Smoking status: Never   Smokeless tobacco: Never  Vaping Use   Vaping status: Never Used  Substance and Sexual Activity   Alcohol  use: No   Drug use: No   Sexual activity: Not on file  Other Topics Concern   Not on file  Social History Narrative   Not on file   Social Drivers of Health   Financial Resource Strain: Not on file  Food Insecurity: Not on file  Transportation Needs: Not on file  Physical Activity: Not on file  Stress: Not on file  Social Connections: Unknown (01/01/2022)   Received from [facility]   Social Network    Social Network: Not on file  Intimate Partner Violence: Unknown (11/23/2021)   Received from [facility]   HITS    Physically Hurt: Not on file    Insult or Talk Down To: Not on file    Threaten Physical Harm: Not on file    Scream or Curse: Not on file    FAMILY HISTORY: Family History  Problem Relation Age of Onset   Diabetes Mother    Hypertension Mother    Colon cancer Neg Hx     ALLERGIES:  is allergic to ampicillin .  MEDICATIONS:  Current Outpatient Medications  Medication Sig Dispense Refill   amLODipine  (NORVASC ) 10 MG tablet Take 1 tablet (10 mg total) by mouth daily. 30 tablet 0   cetirizine (ZYRTEC) 10 MG tablet Take 10 mg by mouth daily.      diclofenac sodium (VOLTAREN) 1 % GEL APPLY 4GM TO AFFECTED JOINTS EVERY 6 HOURS.     folic acid  (FOLVITE ) 1 MG tablet Take 1 tablet (1 mg total) by mouth daily. 30 tablet 11   glimepiride (AMARYL) 2 MG tablet Take 2 mg by mouth daily.     metFORMIN  (GLUCOPHAGE ) 500 MG tablet Take 500 mg by mouth daily.   1   oxyCODONE -acetaminophen  (PERCOCET) 10-325 MG tablet Take 1 tablet by mouth 3 (three) times daily.  0  rosuvastatin  (CRESTOR ) 20 MG tablet Take 20 mg by mouth daily.  1   sucralfate (CARAFATE) 1 g tablet 1 tab(s) orally 4 times a day (before meals and at bedtime) WHEN NEEDED     Vitamin D, Ergocalciferol, (DRISDOL) 50000 units CAPS capsule TAKE 1 CAPSULE BY MOUTH TWICE A WEEK  3   Calcium  Carbonate-Vitamin D (CALCIUM -VITAMIN D) 500-200 MG-UNIT per tablet Take 1 tablet by mouth 2 (two) times daily with a meal.       metoprolol  succinate (TOPROL -XL) 25 MG 24 hr tablet Take 12.5 mg by mouth daily.  0   NARCAN 4 MG/0.1ML LIQD nasal spray kit      PREMARIN vaginal cream INSERT 0.5 GRAMS VAGINALLY TWICE A WEEK FOR 4 WEEKS THEN AS NEEDED     No current facility-administered medications for this visit.    REVIEW OF SYSTEMS:   Constitutional: Denies fevers, chills or abnormal night sweats Eyes: Denies blurriness of vision, double vision or watery eyes Ears, nose, mouth, throat, and face: Denies mucositis or sore throat Respiratory: Denies cough, dyspnea or wheezes Cardiovascular: Denies palpitation, chest discomfort or lower extremity swelling Gastrointestinal:  Denies nausea, heartburn or change in bowel habits Skin: Denies abnormal skin rashes Lymphatics: Denies new lymphadenopathy or easy bruising Neurological:Denies numbness, tingling or new weaknesses Behavioral/Psych: Mood is stable, no new changes  All other systems were reviewed with the patient and are negative.  PHYSICAL EXAMINATION: ECOG PERFORMANCE STATUS: 0 - Asymptomatic  Vitals:   05/07/24 1245  BP: (!) 153/77  Pulse: 76   Resp: 18  Temp: 97.8 F (36.6 C)  SpO2: 100%   Filed Weights   05/07/24 1245  Weight: 179 lb 6.4 oz (81.4 kg)    GENERAL:alert, no distress and comfortable SKIN: skin color, texture, turgor are normal, no rashes or significant lesions EYES: normal, conjunctiva are pink and non-injected, sclera clear OROPHARYNX:no exudate, no erythema and lips, buccal mucosa, and tongue normal  NECK: supple, thyroid  normal size, non-tender, without nodularity LYMPH:  no palpable lymphadenopathy in the cervical, axillary or inguinal LUNGS: clear to auscultation and percussion with normal breathing effort HEART: regular rate & rhythm and no murmurs and no lower extremity edema ABDOMEN:abdomen soft, non-tender and normal bowel sounds.  Examination is limited by central obesity Musculoskeletal:no cyanosis of digits and no clubbing  PSYCH: alert & oriented x 3 with fluent speech NEURO: no focal motor/sensory deficits  LABORATORY DATA:  I have reviewed the data as listed Lab Results  Component Value Date   WBC 4.3 06/05/2020   HGB 12.7 06/05/2020   HCT 37.5 06/05/2020   MCV 85.2 06/05/2020   PLT 92 (L) 06/05/2020

## 2024-05-07 NOTE — Assessment & Plan Note (Signed)
 She had extensive evaluation in 2021 with another hematologist and no definitive causes of leukopenia and thrombocytopenia was found Subsequently, his care was transferred to [facility] again with no definitive cause found I will order repeat labs today I suspect the patient might have hepatosplenomegaly causing sequestration If labs are unrevealing, my next that would be to order CT imaging for further assessment

## 2024-05-10 LAB — MULTIPLE MYELOMA PANEL, SERUM
Albumin SerPl Elph-Mcnc: 3.6 g/dL (ref 2.9–4.4)
Albumin/Glob SerPl: 1 (ref 0.7–1.7)
Alpha 1: 0.2 g/dL (ref 0.0–0.4)
Alpha2 Glob SerPl Elph-Mcnc: 0.6 g/dL (ref 0.4–1.0)
B-Globulin SerPl Elph-Mcnc: 1.2 g/dL (ref 0.7–1.3)
Gamma Glob SerPl Elph-Mcnc: 1.8 g/dL (ref 0.4–1.8)
Globulin, Total: 3.7 g/dL (ref 2.2–3.9)
IgA: 352 mg/dL (ref 87–352)
IgG (Immunoglobin G), Serum: 1827 mg/dL — ABNORMAL HIGH (ref 586–1602)
IgM (Immunoglobulin M), Srm: 102 mg/dL (ref 26–217)
Total Protein ELP: 7.3 g/dL (ref 6.0–8.5)

## 2024-05-10 LAB — KAPPA/LAMBDA LIGHT CHAINS
Kappa free light chain: 26.3 mg/L — ABNORMAL HIGH (ref 3.3–19.4)
Kappa, lambda light chain ratio: 1.55 (ref 0.26–1.65)
Lambda free light chains: 17 mg/L (ref 5.7–26.3)

## 2024-05-10 LAB — ANTINUCLEAR ANTIBODIES, IFA: ANA Ab, IFA: NEGATIVE

## 2024-05-10 LAB — COPPER, SERUM: Copper: 75 ug/dL — ABNORMAL LOW (ref 80–158)

## 2024-05-15 DIAGNOSIS — G4733 Obstructive sleep apnea (adult) (pediatric): Secondary | ICD-10-CM | POA: Diagnosis not present

## 2024-05-17 ENCOUNTER — Encounter: Payer: Self-pay | Admitting: Hematology and Oncology

## 2024-05-17 ENCOUNTER — Inpatient Hospital Stay (HOSPITAL_BASED_OUTPATIENT_CLINIC_OR_DEPARTMENT_OTHER): Admitting: Hematology and Oncology

## 2024-05-17 VITALS — BP 141/82 | HR 68 | Temp 97.5°F | Resp 18 | Ht 64.0 in | Wt 177.6 lb

## 2024-05-17 DIAGNOSIS — D479 Neoplasm of uncertain behavior of lymphoid, hematopoietic and related tissue, unspecified: Secondary | ICD-10-CM | POA: Diagnosis not present

## 2024-05-17 DIAGNOSIS — D61818 Other pancytopenia: Secondary | ICD-10-CM | POA: Diagnosis not present

## 2024-05-17 NOTE — Assessment & Plan Note (Addendum)
 She had worsening pancytopenia over the past 4 years with no explanation that could be found Myeloma panel, autoimmune screen, nutritional panel were all negative Her copper  level is borderline low and I recommend copper  supplement but I do not believe this is the cause of her severe pancytopenia I recommend CT imaging to look for metastatic disease especially with her back pain I plan to order bone marrow biopsy We discussed risk and benefits of sedated versus unsedated bone marrow biopsy and she is willing to undergo unsedated bone marrow I will get that scheduled in 2 weeks

## 2024-05-17 NOTE — Progress Notes (Signed)
 [facility] OFFICE PROGRESS NOTE  Patient Care Team: Catalina Bare, MD as PCP - General (Internal Medicine) Brantley Nancyann SQUIBB, MD (Thoracic Surgery) Maye Ade, MD (Inactive) (Cardiology)  Assessment & Plan Other pancytopenia [facility]) She had worsening pancytopenia over the past 4 years with no explanation that could be found Myeloma panel, autoimmune screen, nutritional panel were all negative Her copper  level is borderline low and I recommend copper  supplement but I do not believe this is the cause of her severe pancytopenia I recommend CT imaging to look for metastatic disease especially with her back pain I plan to order bone marrow biopsy We discussed risk and benefits of sedated versus unsedated bone marrow biopsy and she is willing to undergo unsedated bone marrow I will get that scheduled in 2 weeks  Orders Placed This Encounter  Procedures   CT CHEST ABDOMEN PELVIS W CONTRAST    Standing Status:   Future    Expected Date:   05/24/2024    Expiration Date:   05/17/2025    If indicated for the ordered procedure, I authorize the administration of contrast media per Radiology protocol:   Yes    Does the patient have a contrast media/X-ray dye allergy?:   No    Preferred imaging location?:   [facility]    If indicated for the ordered procedure, I authorize the administration of oral contrast media per Radiology protocol:   No    Reason for no oral contrast::   No need oral contrast     Almarie Bedford, MD  INTERVAL HISTORY: she returns for surveillance follow-up for evaluation of pancytopenia She is here accompanied by her husband I reviewed all the test results We discussed risk and benefits of CT imaging and bone marrow biopsy  PHYSICAL EXAMINATION: ECOG PERFORMANCE STATUS: 0 - Asymptomatic  Vitals:   05/17/24 1456  BP: (!) 141/82  Pulse: 68  Resp: 18  Temp: (!) 97.5 F (36.4 C)  SpO2: 96%   Filed Weights   05/17/24 1456  Weight: 177  lb 9.6 oz (80.6 kg)    Relevant data reviewed during this visit included all labs including myeloma panel, CBC, copper  level, vitamin B12 and autoimmune screen

## 2024-05-18 ENCOUNTER — Emergency Department (HOSPITAL_BASED_OUTPATIENT_CLINIC_OR_DEPARTMENT_OTHER)
Admission: EM | Admit: 2024-05-18 | Discharge: 2024-05-18 | Disposition: A | Discharge status: Home / Self Care | Attending: Emergency Medicine | Admitting: Emergency Medicine

## 2024-05-18 ENCOUNTER — Emergency Department (HOSPITAL_BASED_OUTPATIENT_CLINIC_OR_DEPARTMENT_OTHER)

## 2024-05-18 ENCOUNTER — Other Ambulatory Visit: Payer: Self-pay

## 2024-05-18 ENCOUNTER — Encounter (HOSPITAL_BASED_OUTPATIENT_CLINIC_OR_DEPARTMENT_OTHER): Payer: Self-pay | Admitting: Emergency Medicine

## 2024-05-18 DIAGNOSIS — S46811A Strain of other muscles, fascia and tendons at shoulder and upper arm level, right arm, initial encounter: Secondary | ICD-10-CM | POA: Diagnosis not present

## 2024-05-18 DIAGNOSIS — S199XXA Unspecified injury of neck, initial encounter: Secondary | ICD-10-CM | POA: Diagnosis present

## 2024-05-18 DIAGNOSIS — M47812 Spondylosis without myelopathy or radiculopathy, cervical region: Secondary | ICD-10-CM | POA: Diagnosis not present

## 2024-05-18 DIAGNOSIS — X58XXXA Exposure to other specified factors, initial encounter: Secondary | ICD-10-CM | POA: Insufficient documentation

## 2024-05-18 DIAGNOSIS — M4319 Spondylolisthesis, multiple sites in spine: Secondary | ICD-10-CM | POA: Diagnosis not present

## 2024-05-18 DIAGNOSIS — M50222 Other cervical disc displacement at C5-C6 level: Secondary | ICD-10-CM | POA: Diagnosis not present

## 2024-05-18 DIAGNOSIS — M5021 Other cervical disc displacement,  high cervical region: Secondary | ICD-10-CM | POA: Diagnosis not present

## 2024-05-18 MED ORDER — KETOROLAC TROMETHAMINE 15 MG/ML IJ SOLN
15.0000 mg | Freq: Once | INTRAMUSCULAR | Status: AC
  Administered 2024-05-18: 15 mg via INTRAMUSCULAR
  Filled 2024-05-18: qty 1

## 2024-05-18 MED ORDER — OXYCODONE HCL 5 MG PO TABS
5.0000 mg | ORAL_TABLET | Freq: Once | ORAL | Status: AC
  Administered 2024-05-18: 5 mg via ORAL
  Filled 2024-05-18: qty 1

## 2024-05-18 MED ORDER — METHOCARBAMOL 500 MG PO TABS: 500.0000 mg | ORAL_TABLET | Freq: Two times a day (BID) | ORAL | 0 refills | Status: DC

## 2024-05-18 MED ORDER — DIAZEPAM 2 MG PO TABS
2.0000 mg | ORAL_TABLET | Freq: Once | ORAL | Status: AC
  Administered 2024-05-18: 2 mg via ORAL
  Filled 2024-05-18: qty 1

## 2024-05-18 MED ORDER — ACETAMINOPHEN 500 MG PO TABS
1000.0000 mg | ORAL_TABLET | Freq: Once | ORAL | Status: AC
  Administered 2024-05-18: 1000 mg via ORAL
  Filled 2024-05-18: qty 2

## 2024-05-18 NOTE — ED Triage Notes (Signed)
 Pt c/o R neck pain x 2 days. Denies known injury, fever.   Took 800 mg ibuprofen  appx 2 hr pta.

## 2024-05-18 NOTE — Discharge Instructions (Signed)
 Please follow-up with your family doctor and oncologist in clinic.  Please return for numbness or weakness to the arm or if you develop a fever.  Max dosing of the over-the-counter medications as listed below.  I did prescribe you a muscle relaxant to try and see if that helps as well.  Take 4 over the counter ibuprofen  tablets 3 times a day or 2 over-the-counter naproxen tablets twice a day for pain. Also take tylenol  1000mg (2 extra strength) four times a day.

## 2024-05-18 NOTE — ED Provider Notes (Signed)
 [name] Provider Note   CSN: [id number] Arrival date & time: 05/18/24  1447     Patient presents with: Torticollis   [name] is a 58 y.o. female.   58 yo F with a chief complaints of right-sided neck pain.  Going on for a few days.  Denies injury denies numbness or weakness to the arm.  Denies fever.  Pain worse to the right side of the neck.  Worse with twisting turning deep breathing.        Prior to Admission medications   Medication Sig Start Date End Date Taking? Authorizing Provider  methocarbamol  (ROBAXIN ) 500 MG tablet Take 1 tablet (500 mg total) by mouth 2 (two) times daily. 05/18/24  Yes Emil Share, DO  amLODipine  (NORVASC ) 10 MG tablet Take 1 tablet (10 mg total) by mouth daily. 02/26/18   Jillian Buttery, MD  Calcium  Carbonate-Vitamin D (CALCIUM -VITAMIN D) 500-200 MG-UNIT per tablet Take 1 tablet by mouth 2 (two) times daily with a meal.      [provider]  cetirizine (ZYRTEC) 10 MG tablet Take 10 mg by mouth daily. 12/29/23   [provider]  diclofenac sodium (VOLTAREN) 1 % GEL APPLY 4GM TO AFFECTED JOINTS EVERY 6 HOURS. 01/20/19   [provider]  folic acid  (FOLVITE ) 1 MG tablet Take 1 tablet (1 mg total) by mouth daily. 08/10/19   Franchot Lauraine HERO, NP  glimepiride (AMARYL) 2 MG tablet Take 2 mg by mouth daily. 02/28/18   [provider]  metFORMIN  (GLUCOPHAGE ) 500 MG tablet Take 500 mg by mouth daily.  11/22/17   [provider]  metoprolol  succinate (TOPROL -XL) 25 MG 24 hr tablet Take 12.5 mg by mouth daily. 11/22/17   [provider]  naproxen (NAPROSYN) 500 MG tablet Take 500 mg by mouth every 8 (eight) hours. 04/24/24   [provider]  NARCAN 4 MG/0.1ML LIQD nasal spray kit  09/10/18   [provider]  oxyCODONE -acetaminophen  (PERCOCET) 10-325 MG tablet Take 1 tablet by mouth 3 (three) times daily. 01/12/18   [provider]  pregabalin   (LYRICA ) 150 MG capsule Take 150 mg by mouth every 12 (twelve) hours. 04/11/24   [provider]  PREMARIN vaginal cream INSERT 0.5 GRAMS VAGINALLY TWICE A WEEK FOR 4 WEEKS THEN AS NEEDED 03/13/19   [provider]  RELISTOR 150 MG TABS Take 3 tablets by mouth daily. 03/26/24   [provider]  rosuvastatin  (CRESTOR ) 20 MG tablet Take 20 mg by mouth daily. 11/22/17   [provider]  sucralfate (CARAFATE) 1 g tablet 1 tab(s) orally 4 times a day (before meals and at bedtime) WHEN NEEDED    [provider]  Vitamin D, Ergocalciferol, (DRISDOL) 50000 units CAPS capsule TAKE 1 CAPSULE BY MOUTH TWICE A WEEK 02/09/18   [provider]    Allergies: Ampicillin     Review of Systems  Updated Vital Signs BP 127/67   Pulse 60   Temp 98.2 F (36.8 C) (Oral)   Resp 20   Ht 5' 4 (1.626 m)   Wt 80.3 kg   LMP 05/18/2012   SpO2 98%   BMI 30.38 kg/m   Physical Exam Vitals and nursing note reviewed.  Constitutional:      General: She is not in acute distress.    Appearance: She is well-developed. She is not diaphoretic.  HENT:     Head: Normocephalic and atraumatic.  Eyes:     Pupils:  Pupils are equal, round, and reactive to light.  Cardiovascular:     Rate and Rhythm: Normal rate and regular rhythm.     Heart sounds: No murmur heard.    No friction rub. No gallop.  Pulmonary:     Effort: Pulmonary effort is normal.     Breath sounds: No wheezing or rales.  Abdominal:     General: There is no distension.     Palpations: Abdomen is soft.     Tenderness: There is no abdominal tenderness.  Musculoskeletal:        General: Tenderness present.     Cervical back: Normal range of motion and neck supple.     Comments: Pain along the trapezius muscle belly on the right.  Reproduces discomfort.  Pain and spasm to the muscle.  Pulse motor and sensation intact distally.  Skin:    General: Skin is warm and dry.  Neurological:     Mental Status: She  is alert and oriented to person, place, and time.  Psychiatric:        Behavior: Behavior normal.     (all labs ordered are listed, but only abnormal results are displayed) Labs Reviewed - No data to display  EKG: None  Radiology: CT Cervical Spine Wo Contrast Result Date: 05/18/2024 EXAM: CT CERVICAL SPINE WITHOUT CONTRAST 05/18/2024 04:02:00 PM TECHNIQUE: CT of the cervical spine was performed without the administration of intravenous contrast. Multiplanar reformatted images are provided for review. Automated exposure control, iterative reconstruction, and/or weight based adjustment of the mA/kV was utilized to reduce the radiation dose to as low as reasonably achievable. COMPARISON: MRI cervical spine 04/07/2013. CLINICAL HISTORY: Cervical radiculopathy, infection suspected, R neck pain x 2 days. Denies known injury, fever. FINDINGS: CERVICAL SPINE: BONES AND ALIGNMENT: Straightening of the normal cervical lordosis. 2 mm degenerative anterolisthesis at C7-T1 and T1-T2. 1.5 mm anterolisthesis at C3-C4. No fracture or acute bony findings identified. DEGENERATIVE CHANGES: Anterior and posterior interbody spurring at C6-C7. Anterior interbody spurring at C4-C5 and C5-C6. C2-C3: No impingement. Bilateral degenerative facet arthropathy. C3-C4: No impingement. Central disc protrusion and right degenerative facet arthropathy. C4-C5: Unremarkable. C5-C6: No impingement. Disc bulge noted. C6-C7: No impingement. Disc osteophyte complex/interbody spurring noted. C7-T1: No impingement. Bilateral degenerative facet arthropathy. T1-T2: No impingement. Bilateral degenerative facet arthropathy. SOFT TISSUES: No prevertebral soft tissue swelling. IMPRESSION: 1. No acute abnormality of the cervical spine. 2. Degenerative anterolisthesis: 1.5 mm at C3-4; 2 mm at C7-T1 and T1-2. 3. Degenerative facet arthropathy: bilateral at C2-3, C7-T1, and T1-2; right-sided at C3-4. 4. Disc pathology: central disc protrusion at C3-4;  disc bulge at C5-6; disc osteophyte complex/interbody spurring at C6-7; anterior interbody spurring at C4-5 and C5-6; anterior and posterior interbody spurring at C6-7. 5. Straightening of the normal cervical lordosis. No overt impingement. Electronically signed by: Ryan Salvage MD 05/18/2024 04:11 PM EDT RP Workstation: [id number]     Procedures   Medications Ordered in the ED  acetaminophen  (TYLENOL ) tablet 1,000 mg (1,000 mg Oral Given 05/18/24 1547)  ketorolac  (TORADOL ) 15 MG/ML injection 15 mg (15 mg Intramuscular Given 05/18/24 1548)  oxyCODONE  (Oxy IR/ROXICODONE ) immediate release tablet 5 mg (5 mg Oral Given 05/18/24 1548)  diazepam  (VALIUM ) tablet 2 mg (2 mg Oral Given 05/18/24 1548)                                    Medical Decision Making Amount and/or Complexity of Data  Reviewed Radiology: ordered.  Risk OTC drugs. Prescription drug management.   58 yo F with a chief complaints of right-sided neck pain.  This been going on for a few days now.  History and physical most consistent with trapezius strain.  However on record review the patient has a history of osteomyelitis and has also recently seen her oncologist who was worried that she might have metastatic disease to her spine.  Will obtain CT imaging initially of the C-spine.  Treat symptoms.  Reassess.  Patient feeling quite a bit better on repeat assessments.  CT imaging without obvious acute pathology.  I did offer MRI imaging with patient having history of osteomyelitis.  Currently declining.  Plan to follow-up with their PCP and oncologist in clinic.  4:16 PM:  I have discussed the diagnosis/risks/treatment options with the patient and family.  Evaluation and diagnostic testing in the emergency department does not suggest an emergent condition requiring admission or immediate intervention beyond what has been performed at this time.  They will follow up with PCP, oncology. We also discussed returning to the ED  immediately if new or worsening sx occur. We discussed the sx which are most concerning (e.g., sudden worsening pain, fever, inability to tolerate by mouth, numbness, weakness) that necessitate immediate return. Medications administered to the patient during their visit and any new prescriptions provided to the patient are listed below.  Medications given during this visit Medications  acetaminophen  (TYLENOL ) tablet 1,000 mg (1,000 mg Oral Given 05/18/24 1547)  ketorolac  (TORADOL ) 15 MG/ML injection 15 mg (15 mg Intramuscular Given 05/18/24 1548)  oxyCODONE  (Oxy IR/ROXICODONE ) immediate release tablet 5 mg (5 mg Oral Given 05/18/24 1548)  diazepam  (VALIUM ) tablet 2 mg (2 mg Oral Given 05/18/24 1548)     The patient appears reasonably screen and/or stabilized for discharge and I doubt any other medical condition or other [facility] requiring further screening, evaluation, or treatment in the ED at this time prior to discharge.       Final diagnoses:  Trapezius strain, right, initial encounter    ED Discharge Orders          Ordered    methocarbamol  (ROBAXIN ) 500 MG tablet  2 times daily        05/18/24 1615               Emil Share, DO 05/18/24 1616

## 2024-05-22 ENCOUNTER — Ambulatory Visit (HOSPITAL_COMMUNITY)
Admission: RE | Admit: 2024-05-22 | Discharge: 2024-05-22 | Disposition: A | Discharge status: Home / Self Care | Source: Ambulatory Visit | Attending: Hematology and Oncology | Admitting: Hematology and Oncology

## 2024-05-22 DIAGNOSIS — R590 Localized enlarged lymph nodes: Secondary | ICD-10-CM | POA: Diagnosis not present

## 2024-05-22 DIAGNOSIS — D61818 Other pancytopenia: Secondary | ICD-10-CM | POA: Diagnosis not present

## 2024-05-22 DIAGNOSIS — D479 Neoplasm of uncertain behavior of lymphoid, hematopoietic and related tissue, unspecified: Secondary | ICD-10-CM | POA: Insufficient documentation

## 2024-05-22 DIAGNOSIS — D259 Leiomyoma of uterus, unspecified: Secondary | ICD-10-CM | POA: Diagnosis not present

## 2024-05-22 DIAGNOSIS — N200 Calculus of kidney: Secondary | ICD-10-CM | POA: Diagnosis not present

## 2024-05-22 MED ORDER — IOHEXOL 300 MG/ML  SOLN
100.0000 mL | Freq: Once | INTRAMUSCULAR | Status: AC | PRN
  Administered 2024-05-22: 100 mL via INTRAVENOUS

## 2024-05-24 ENCOUNTER — Encounter

## 2024-05-24 ENCOUNTER — Telehealth: Payer: Self-pay

## 2024-05-24 ENCOUNTER — Other Ambulatory Visit

## 2024-05-24 NOTE — Telephone Encounter (Signed)
 Called and spoke with husband. Scheduled appt with Dr. Lonn for tomorrow at 2:30 pm. He is aware of appt.

## 2024-05-25 ENCOUNTER — Inpatient Hospital Stay: Attending: Hematology and Oncology | Admitting: Hematology and Oncology

## 2024-05-25 ENCOUNTER — Other Ambulatory Visit: Payer: Self-pay | Admitting: Hematology and Oncology

## 2024-05-25 ENCOUNTER — Encounter: Payer: Self-pay | Admitting: Hematology and Oncology

## 2024-05-25 VITALS — BP 157/71 | HR 70 | Temp 98.4°F | Resp 17 | Wt 178.8 lb

## 2024-05-25 DIAGNOSIS — E328 Other diseases of thymus: Secondary | ICD-10-CM | POA: Diagnosis not present

## 2024-05-25 DIAGNOSIS — C889 Malignant immunoproliferative disease, unspecified not having achieved remission: Secondary | ICD-10-CM | POA: Diagnosis not present

## 2024-05-25 DIAGNOSIS — D61818 Other pancytopenia: Secondary | ICD-10-CM

## 2024-05-25 DIAGNOSIS — Z114 Encounter for screening for human immunodeficiency virus [HIV]: Secondary | ICD-10-CM | POA: Insufficient documentation

## 2024-05-25 NOTE — Assessment & Plan Note (Addendum)
 I reviewed recent imaging study with the patient I reviewed her prior CT imaging from 2015 She has anterior mediastinal mass of unknown etiology Malignant process such as lymphoma is high in the differential diagnosis and could explain the cause of her pancytopenia Per radiologist recommendation, I will order PET/CT imaging for further evaluation If PET/CT imaging is abnormal, she will need mediastinal mass biopsy

## 2024-05-25 NOTE — Assessment & Plan Note (Addendum)
 The cause of her pancytopenia is unknown Due to her decline in platelet count, and malignant process is in the differential, we will proceed with bone marrow aspirate and biopsy as scheduled on Monday

## 2024-05-25 NOTE — Progress Notes (Signed)
 [facility] OFFICE PROGRESS NOTE  Patient Care Team: Catalina Bare, MD as PCP - General (Internal Medicine) Brantley Nancyann SQUIBB, MD (Thoracic Surgery) Maye Ade, MD (Inactive) (Cardiology)  Assessment & Plan Other pancytopenia [facility]) The cause of her pancytopenia is unknown Due to her decline in platelet count, and malignant process is in the differential, we will proceed with bone marrow aspirate and biopsy as scheduled on Monday Thymic mass I reviewed recent imaging study with the patient I reviewed her prior CT imaging from 2015 She has anterior mediastinal mass of unknown etiology Malignant process such as lymphoma is high in the differential diagnosis and could explain the cause of her pancytopenia Per radiologist recommendation, I will order PET/CT imaging for further evaluation If PET/CT imaging is abnormal, she will need mediastinal mass biopsy Malignant immunoproliferative disease, 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) I will order PET CT scan above  Orders Placed This Encounter  Procedures   NM PET Image Initial (PI) Skull Base To Thigh    Standing Status:   Future    Expected Date:   06/01/2024    Expiration Date:   05/25/2025    If indicated for the ordered procedure, I authorize the administration of a radiopharmaceutical per Radiology protocol:   Yes    Is the patient pregnant?:   No    Preferred imaging location?:   [facility] Almarie Lonn, MD  INTERVAL HISTORY: she returns for surveillance follow-up and review of test results I reviewed multiple imaging studies with the patient We discussed the rationale behind order PET/CT imaging and bone marrow biopsy  PHYSICAL EXAMINATION: ECOG PERFORMANCE STATUS: 0 - Asymptomatic  Vitals:   05/25/24 1434  BP: (!) 157/71  Pulse: 70  Resp: 17  Temp: 98.4 F (36.9 C)  SpO2: 100%   Filed Weights   05/25/24 1434  Weight: 178 lb 12.8 oz (81.1 kg)    Relevant data reviewed  during this visit included CT imaging from 2015 and September 2025

## 2024-05-28 ENCOUNTER — Inpatient Hospital Stay

## 2024-05-28 ENCOUNTER — Inpatient Hospital Stay: Admitting: Hematology and Oncology

## 2024-05-28 ENCOUNTER — Other Ambulatory Visit: Payer: Self-pay | Admitting: Hematology and Oncology

## 2024-05-28 DIAGNOSIS — D61818 Other pancytopenia: Secondary | ICD-10-CM

## 2024-05-28 NOTE — Progress Notes (Signed)
 Bone Marrow Biopsy and Aspiration Procedure Note   Informed consent was obtained and potential risks including bleeding, infection and pain were reviewed with the patient. The patient's name, date of birth, identification, consent and allergies were verified prior to the start of procedure and time out was performed.  The left posterior iliac crest was chosen as the site of biopsy.  The skin was prepped with Betadine  solution.   5 cc of 1% lidocaine  and approximately 7 cc of 2% lidocaine  was used to provide local anaesthesia.   The patient has significant soft tissue between the skin and her iliac crest Despite using extra long Jamshidi needle to locate landmarks, I am not able to locate her posterior iliac crest The procedure and is ultimately abandoned due to concern that I will not be able to perform the procedure safely She has no complications after the procedure is terminated The plan will be to proceed with CT-guided bone marrow aspirate and biopsy

## 2024-05-29 DIAGNOSIS — M25561 Pain in right knee: Secondary | ICD-10-CM | POA: Diagnosis not present

## 2024-05-29 DIAGNOSIS — M47817 Spondylosis without myelopathy or radiculopathy, lumbosacral region: Secondary | ICD-10-CM | POA: Diagnosis not present

## 2024-05-29 DIAGNOSIS — M1711 Unilateral primary osteoarthritis, right knee: Secondary | ICD-10-CM | POA: Diagnosis not present

## 2024-05-31 ENCOUNTER — Telehealth: Payer: Self-pay

## 2024-05-31 NOTE — Telephone Encounter (Signed)
 Called and scheduled appt with Dr. Lonn on 10/28 at 3 pm for 30 mins. Husband is aware of appt.

## 2024-06-01 ENCOUNTER — Encounter (HOSPITAL_COMMUNITY)
Admission: RE | Admit: 2024-06-01 | Discharge: 2024-06-01 | Disposition: A | Discharge status: Home / Self Care | Source: Ambulatory Visit | Attending: Hematology and Oncology | Admitting: Hematology and Oncology

## 2024-06-01 DIAGNOSIS — D61818 Other pancytopenia: Secondary | ICD-10-CM | POA: Diagnosis not present

## 2024-06-01 DIAGNOSIS — M25561 Pain in right knee: Secondary | ICD-10-CM | POA: Diagnosis not present

## 2024-06-01 DIAGNOSIS — N200 Calculus of kidney: Secondary | ICD-10-CM | POA: Diagnosis not present

## 2024-06-01 DIAGNOSIS — M47817 Spondylosis without myelopathy or radiculopathy, lumbosacral region: Secondary | ICD-10-CM | POA: Diagnosis not present

## 2024-06-01 DIAGNOSIS — I2510 Atherosclerotic heart disease of native coronary artery without angina pectoris: Secondary | ICD-10-CM | POA: Diagnosis not present

## 2024-06-01 DIAGNOSIS — J9859 Other diseases of mediastinum, not elsewhere classified: Secondary | ICD-10-CM | POA: Diagnosis not present

## 2024-06-01 DIAGNOSIS — C889 Malignant immunoproliferative disease, unspecified not having achieved remission: Secondary | ICD-10-CM | POA: Diagnosis not present

## 2024-06-01 DIAGNOSIS — N289 Disorder of kidney and ureter, unspecified: Secondary | ICD-10-CM | POA: Diagnosis not present

## 2024-06-01 DIAGNOSIS — E328 Other diseases of thymus: Secondary | ICD-10-CM | POA: Insufficient documentation

## 2024-06-01 DIAGNOSIS — G894 Chronic pain syndrome: Secondary | ICD-10-CM | POA: Diagnosis not present

## 2024-06-01 DIAGNOSIS — Z79891 Long term (current) use of opiate analgesic: Secondary | ICD-10-CM | POA: Diagnosis not present

## 2024-06-01 DIAGNOSIS — M1711 Unilateral primary osteoarthritis, right knee: Secondary | ICD-10-CM | POA: Diagnosis not present

## 2024-06-01 LAB — GLUCOSE, CAPILLARY: Glucose-Capillary: 86 mg/dL (ref 70–99)

## 2024-06-01 MED ORDER — FLUDEOXYGLUCOSE F - 18 (FDG) INJECTION
9.0100 | Freq: Once | INTRAVENOUS | Status: AC
  Administered 2024-06-01: 9.01 via INTRAVENOUS

## 2024-06-07 ENCOUNTER — Ambulatory Visit: Admitting: Hematology and Oncology

## 2024-06-11 ENCOUNTER — Other Ambulatory Visit: Payer: Self-pay | Admitting: Radiology

## 2024-06-11 DIAGNOSIS — D61818 Other pancytopenia: Secondary | ICD-10-CM

## 2024-06-11 NOTE — Progress Notes (Signed)
 Patient for IR Bone Marrow Biopsy on Tues 06/12/24, I called and spoke with the patient on the phone and gave pre-procedure instructions. Pt was made aware to be here at 7:30a, NPO after MN prior to procedure as well as driver post procedure/recovery/discharge. Pt stated understanding.  Called 06/11/24

## 2024-06-12 ENCOUNTER — Other Ambulatory Visit: Payer: Self-pay

## 2024-06-12 ENCOUNTER — Ambulatory Visit
Admission: RE | Admit: 2024-06-12 | Discharge: 2024-06-12 | Disposition: A | Discharge status: Home / Self Care | Source: Ambulatory Visit | Attending: Hematology and Oncology | Admitting: Hematology and Oncology

## 2024-06-12 ENCOUNTER — Encounter: Payer: Self-pay | Admitting: Radiology

## 2024-06-12 DIAGNOSIS — Z7984 Long term (current) use of oral hypoglycemic drugs: Secondary | ICD-10-CM | POA: Insufficient documentation

## 2024-06-12 DIAGNOSIS — I10 Essential (primary) hypertension: Secondary | ICD-10-CM | POA: Diagnosis not present

## 2024-06-12 DIAGNOSIS — Z79899 Other long term (current) drug therapy: Secondary | ICD-10-CM | POA: Insufficient documentation

## 2024-06-12 DIAGNOSIS — D573 Sickle-cell trait: Secondary | ICD-10-CM | POA: Insufficient documentation

## 2024-06-12 DIAGNOSIS — D61818 Other pancytopenia: Secondary | ICD-10-CM | POA: Diagnosis not present

## 2024-06-12 DIAGNOSIS — Z1379 Encounter for other screening for genetic and chromosomal anomalies: Secondary | ICD-10-CM | POA: Insufficient documentation

## 2024-06-12 DIAGNOSIS — K219 Gastro-esophageal reflux disease without esophagitis: Secondary | ICD-10-CM | POA: Insufficient documentation

## 2024-06-12 DIAGNOSIS — E785 Hyperlipidemia, unspecified: Secondary | ICD-10-CM | POA: Diagnosis not present

## 2024-06-12 DIAGNOSIS — E7849 Other hyperlipidemia: Secondary | ICD-10-CM | POA: Diagnosis not present

## 2024-06-12 DIAGNOSIS — D72822 Plasmacytosis: Secondary | ICD-10-CM | POA: Diagnosis not present

## 2024-06-12 DIAGNOSIS — D7589 Other specified diseases of blood and blood-forming organs: Secondary | ICD-10-CM | POA: Diagnosis not present

## 2024-06-12 DIAGNOSIS — E1169 Type 2 diabetes mellitus with other specified complication: Secondary | ICD-10-CM | POA: Insufficient documentation

## 2024-06-12 HISTORY — PX: IR BONE MARROW BIOPSY & ASPIRATION: IMG5727

## 2024-06-12 LAB — CBC WITH DIFFERENTIAL/PLATELET
Abs Immature Granulocytes: 0.02 K/uL (ref 0.00–0.07)
Basophils Absolute: 0 K/uL (ref 0.0–0.1)
Basophils Relative: 0 %
Eosinophils Absolute: 0 K/uL (ref 0.0–0.5)
Eosinophils Relative: 1 %
HCT: 32 % — ABNORMAL LOW (ref 36.0–46.0)
Hemoglobin: 11 g/dL — ABNORMAL LOW (ref 12.0–15.0)
Immature Granulocytes: 1 %
Lymphocytes Relative: 48 %
Lymphs Abs: 1.8 K/uL (ref 0.7–4.0)
MCH: 29.3 pg (ref 26.0–34.0)
MCHC: 34.4 g/dL (ref 30.0–36.0)
MCV: 85.3 fL (ref 80.0–100.0)
Monocytes Absolute: 0.4 K/uL (ref 0.1–1.0)
Monocytes Relative: 11 %
Neutro Abs: 1.5 K/uL — ABNORMAL LOW (ref 1.7–7.7)
Neutrophils Relative %: 39 %
Platelets: 65 K/uL — ABNORMAL LOW (ref 150–400)
RBC: 3.75 MIL/uL — ABNORMAL LOW (ref 3.87–5.11)
RDW: 13.9 % (ref 11.5–15.5)
WBC: 3.8 K/uL — ABNORMAL LOW (ref 4.0–10.5)
nRBC: 0 % (ref 0.0–0.2)

## 2024-06-12 LAB — GLUCOSE, CAPILLARY
Glucose-Capillary: 67 mg/dL — ABNORMAL LOW (ref 70–99)
Glucose-Capillary: 105 mg/dL — ABNORMAL HIGH (ref 70–99)

## 2024-06-12 MED ORDER — FENTANYL CITRATE (PF) 100 MCG/2ML IJ SOLN
INTRAMUSCULAR | Status: AC | PRN
  Administered 2024-06-12 (×2): 50 ug via INTRAVENOUS

## 2024-06-12 MED ORDER — LIDOCAINE 1 % OPTIME INJ - NO CHARGE
10.0000 mL | Freq: Once | INTRAMUSCULAR | Status: AC
  Administered 2024-06-12: 15 mL via INTRADERMAL
  Filled 2024-06-12: qty 10

## 2024-06-12 MED ORDER — MIDAZOLAM HCL 2 MG/2ML IJ SOLN
INTRAMUSCULAR | Status: AC
  Filled 2024-06-12: qty 2

## 2024-06-12 MED ORDER — HEPARIN SOD (PORK) LOCK FLUSH 100 UNIT/ML IV SOLN
500.0000 [IU] | Freq: Once | INTRAVENOUS | Status: AC
  Administered 2024-06-12: 500 [IU] via INTRAVENOUS

## 2024-06-12 MED ORDER — FENTANYL CITRATE (PF) 100 MCG/2ML IJ SOLN
INTRAMUSCULAR | Status: AC
  Filled 2024-06-12: qty 2

## 2024-06-12 MED ORDER — SODIUM CHLORIDE 0.9 % IV SOLN: INTRAVENOUS | Status: DC

## 2024-06-12 MED ORDER — DEXTROSE 50 % IV SOLN
12.5000 g | INTRAVENOUS | Status: AC
  Administered 2024-06-12: 12.5 g via INTRAVENOUS
  Filled 2024-06-12: qty 50

## 2024-06-12 MED ORDER — HEPARIN SOD (PORK) LOCK FLUSH 100 UNIT/ML IV SOLN
INTRAVENOUS | Status: AC
  Filled 2024-06-12: qty 5

## 2024-06-12 MED ORDER — MIDAZOLAM HCL (PF) 2 MG/2ML IJ SOLN
INTRAMUSCULAR | Status: AC | PRN
  Administered 2024-06-12 (×2): 1 mg via INTRAVENOUS

## 2024-06-12 MED ORDER — DEXTROSE 50 % IV SOLN
INTRAVENOUS | Status: AC
  Filled 2024-06-12: qty 50

## 2024-06-12 NOTE — Progress Notes (Signed)
 Patient clinically stable post IR BMB per Dr Luverne, tolerated well. Received Versed 2 mg along with Fentanyl 100 mcg IV for procedure. Vitals stable post procedure. Report given to Grayce Ann RN post procedure/specials/23

## 2024-06-12 NOTE — Consult Note (Signed)
 Chief Complaint: Patient was seen in consultation today for pancytopenia/  Referring Physician(s): Gorsuch,Ni  Supervising Physician: Luverne Aran  Patient Status: [facility] - Out-pt  History of Present Illness: [name] is a 58 y.o. female with a past medical history significant for arthritis, GERD, DM, HTN, HLD, thymic mass and pancytopenia who presents today for bone marrow aspiration/biopsy. [name] was first noted to have pancytopenia in 2020 and she was referred to hematology where she underwent an extensive workup. She was found to be positive for sickle cell trait but the workup was otherwise negative. She has been followed by hematology regularly since that time with no further explanation for her pancytopenia. In September she reported back pain and underwent CT chest/abd/pelvis w/contrast on 05/22/24 which showed:  1. Enlarged anterior mediastinal lymph node or soft tissue mass measuring 4.0 x 2.9 cm. Mildly enlarged high right paratracheal lymph node measuring 1.3 x 0.9 cm. Findings are highly concerning for malignancy, thymoma and lymphoma substantial differential considerations. Consider PET-CT for metabolic characterization and tissue sampling. 2. No other evidence of lymphadenopathy or metastatic disease in the chest, abdomen, or pelvis. Normal spleen. 3. Cardiomegaly. 4. Nonobstructive left nephrolithiasis  Follow up PET scan was obtained 05/22/24 which showed:  1. Mildly hyperdense prevascular soft tissue mass does not have corresponding FDG uptake. Additionally, there is no change in Hounsfield units on today's study without contrast versus CT chest with contrast on 05/22/2024. 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 If further evaluation is desired, MR chest without and with contrast could be performed. 2. Age advanced left anterior descending coronary artery calcification. 3. Tiny left renal stones. 4.  Aortic  atherosclerosis (ICD10-I70.0). 5. Enlarged pulmonic trunk, indicative of pulmonary arterial Hypertension.  Bone marrow biopsy was recommended and the procedure was attempted in office with Dr. Lonn however this was unsuccessful. She has been referred to IR for repeat bone marrow aspiration/biopsy attempt.  Past Medical History:  Diagnosis Date   Arthritis 2008   Lumbar Deg Disc Disease   Chest pain    Diabetes mellitus without complication (HCC)    GERD (gastroesophageal reflux disease)    HTN (hypertension)    Hyperlipidemia associated with type 2 diabetes mellitus (HCC)    Low vitamin D level    Migraine    Sickle cell trait    Thymic cyst     Past Surgical History:  Procedure Laterality Date   CARDIAC CATHETERIZATION     COLONOSCOPY      Allergies: Ampicillin   Medications: Prior to Admission medications   Medication Sig Start Date End Date Taking? Authorizing Provider  amLODipine  (NORVASC ) 10 MG tablet Take 1 tablet (10 mg total) by mouth daily. 02/26/18  Yes Jillian Buttery, MD  Calcium  Carbonate-Vitamin D (CALCIUM -VITAMIN D) 500-200 MG-UNIT per tablet Take 1 tablet by mouth 2 (two) times daily with a meal.     Yes [provider]  cetirizine (ZYRTEC) 10 MG tablet Take 10 mg by mouth daily. 12/29/23  Yes [provider]  diclofenac sodium (VOLTAREN) 1 % GEL APPLY 4GM TO AFFECTED JOINTS EVERY 6 HOURS. 01/20/19  Yes [provider]  folic acid  (FOLVITE ) 1 MG tablet Take 1 tablet (1 mg total) by mouth daily. 08/10/19  Yes Franchot Lauraine HERO, NP  glimepiride (AMARYL) 2 MG tablet Take 2 mg by mouth daily. 02/28/18  Yes [provider]  metFORMIN  (GLUCOPHAGE ) 500 MG tablet Take 500 mg by mouth daily.  11/22/17  Yes [provider]  methocarbamol  (  ROBAXIN ) 500 MG tablet Take 1 tablet (500 mg total) by mouth 2 (two) times daily. 05/18/24  Yes Emil Share, DO  metoprolol  succinate (TOPROL -XL) 25 MG 24 hr tablet Take 12.5 mg by mouth daily. 11/22/17   Yes [provider]  oxyCODONE -acetaminophen  (PERCOCET) 10-325 MG tablet Take 1 tablet by mouth 3 (three) times daily. 01/12/18  Yes [provider]  pregabalin  (LYRICA ) 150 MG capsule Take 150 mg by mouth every 12 (twelve) hours. 04/11/24  Yes [provider]  rosuvastatin  (CRESTOR ) 20 MG tablet Take 20 mg by mouth daily. 11/22/17  Yes [provider]  sucralfate (CARAFATE) 1 g tablet 1 tab(s) orally 4 times a day (before meals and at bedtime) WHEN NEEDED   Yes [provider]  Vitamin D, Ergocalciferol, (DRISDOL) 50000 units CAPS capsule TAKE 1 CAPSULE BY MOUTH TWICE A WEEK 02/09/18  Yes [provider]  naproxen (NAPROSYN) 500 MG tablet Take 500 mg by mouth every 8 (eight) hours. 04/24/24   [provider]  NARCAN 4 MG/0.1ML LIQD nasal spray kit  09/10/18   [provider]  PREMARIN vaginal cream INSERT 0.5 GRAMS VAGINALLY TWICE A WEEK FOR 4 WEEKS THEN AS NEEDED 03/13/19   [provider]  RELISTOR 150 MG TABS Take 3 tablets by mouth daily. 03/26/24   [provider]     Family History  Problem Relation Age of Onset   Diabetes Mother    Hypertension Mother    Colon cancer Neg Hx     Social History   Socioeconomic History   Marital status: Married    Spouse name: Not on file   Number of children: 4   Years of education: Not on file   Highest education level: Not on file  Occupational History   Occupation: not working  Tobacco Use   Smoking status: Never   Smokeless tobacco: Never  Vaping Use   Vaping status: Never Used  Substance and Sexual Activity   Alcohol  use: No   Drug use: No   Sexual activity: Not on file  Other Topics Concern   Not on file  Social History Narrative   Lives with spouse .SABRA   Social Drivers of Corporate investment banker Strain: Not on file  Food Insecurity: Not on file  Transportation Needs: Not on file  Physical Activity: Not on file  Stress: Not on file  Social  Connections: Unknown (01/01/2022)   Received from [facility]   Social Network    Social Network: Not on file     Review of Systems: A 12 point ROS discussed and pertinent positives are indicated in the HPI above.  All other systems are negative.  Review of Systems  Constitutional:  Negative for chills and fever.  Respiratory:  Negative for cough and shortness of breath.   Cardiovascular:  Negative for chest pain.  Gastrointestinal:  Negative for abdominal pain, diarrhea, nausea and vomiting.  Neurological:  Negative for dizziness and headaches.    Vital Signs: BP (!) 151/78   Pulse (!) 59   Resp 18   Ht 5' 4 (1.626 m)   Wt 180 lb (81.6 kg)   LMP 05/18/2012   SpO2 97%   BMI 30.90 kg/m   Physical Exam Vitals reviewed.  Constitutional:      General: She is not in acute distress. HENT:     Head: Normocephalic.     Mouth/Throat:     Mouth: Mucous membranes are moist.     Pharynx:  Oropharynx is clear. No oropharyngeal exudate or posterior oropharyngeal erythema.  Cardiovascular:     Rate and Rhythm: Normal rate and regular rhythm.  Pulmonary:     Effort: Pulmonary effort is normal.     Breath sounds: Normal breath sounds.  Abdominal:     General: There is no distension.     Palpations: Abdomen is soft.     Tenderness: There is no abdominal tenderness.  Skin:    General: Skin is warm and dry.  Neurological:     Mental Status: She is alert and oriented to person, place, and time.  Psychiatric:        Mood and Affect: Mood normal.        Behavior: Behavior normal.        Thought Content: Thought content normal.        Judgment: Judgment normal.      MD Evaluation Airway: WNL Heart: WNL Abdomen: WNL Chest/ Lungs: WNL ASA  Classification: 2 Mallampati/Airway Score: One   Imaging: NM PET Image Initial (PI) Skull Base To Thigh Result Date: 06/02/2024 CLINICAL DATA:  Initial treatment strategy for mediastinal mass. EXAM: NUCLEAR MEDICINE PET SKULL BASE TO  THIGH TECHNIQUE: 9.0 mCi F-18 FDG was injected intravenously. Full-ring PET imaging was performed from the skull base to thigh after the radiotracer. CT data was obtained and used for attenuation correction and anatomic localization. Fasting blood glucose: 86 mg/dl COMPARISON:  CT chest abdomen pelvis 05/22/2024. FINDINGS: Mediastinal blood pool activity: SUV max 2.7 Liver activity: SUV max NA NECK: Symmetric tonsillar hypermetabolism.  No hypermetabolic lymph nodes. Incidental CT findings: None. CHEST: No abnormal hypermetabolism. Incidental CT findings: Mildly hyperdense 3.2 x 4.7 cm mass in the prevascular space. Atherosclerotic calcification of the aorta and aortic valve with age advanced involving the left anterior descending coronary artery. Enlarged pulmonic trunk and heart. No pericardial or pleural effusion. ABDOMEN/PELVIS: No abnormal hypermetabolism. Incidental CT findings: Slight thickening of the left adrenal gland. No specific follow-up necessary. Scarring in the kidneys. Tiny left renal stones. Low-attenuation lesions in the left kidney, too small to characterize. No specific follow-up necessary. SKELETON: Scattered degenerative uptake.  No abnormal hypermetabolism. Incidental CT findings: Degenerative changes in the spine. IMPRESSION: 1. Mildly hyperdense prevascular soft tissue mass does not have corresponding FDG uptake. Additionally, there is no change in Hounsfield units on today's study without contrast versus CT chest with contrast on 05/22/2024. 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 If further evaluation is desired, MR chest without and with contrast could be performed. 2. Age advanced left anterior descending coronary artery calcification. 3. Tiny left renal stones. 4.  Aortic atherosclerosis (ICD10-I70.0). 5. Enlarged pulmonic trunk, indicative of pulmonary arterial hypertension. Electronically Signed   By: Newell Eke M.D.   On: 06/02/2024 13:25   CT  CHEST ABDOMEN PELVIS W CONTRAST Result Date: 05/22/2024 CLINICAL DATA:  Pancytopenia, evaluate for metastatic disease * Tracking Code: BO * EXAM: CT CHEST, ABDOMEN, AND PELVIS WITH CONTRAST TECHNIQUE: Multidetector CT imaging of the chest, abdomen and pelvis was performed following the standard protocol during bolus administration of intravenous contrast. RADIATION DOSE REDUCTION: This exam was performed according to the departmental dose-optimization program which includes automated exposure control, adjustment of the mA and/or kV according to patient size and/or use of iterative reconstruction technique. CONTRAST:  100mL OMNIPAQUE IOHEXOL 300 MG/ML  SOLN COMPARISON:  None Available. FINDINGS: CT CHEST FINDINGS Cardiovascular: No significant vascular findings. Cardiomegaly. No pericardial effusion. Mediastinum/Nodes: Enlarged anterior mediastinal lymph node or soft  tissue mass measuring 4.0 x 2.9 cm (series 2, image 21). Mildly enlarged high right paratracheal lymph node measuring 1.3 x 0.9 cm (series 2, image 14). Thyroid  gland, trachea, and esophagus demonstrate no significant findings. Lungs/Pleura: Lungs are clear. No pleural effusion or pneumothorax. Musculoskeletal: No chest wall abnormality. No acute osseous findings. CT ABDOMEN PELVIS FINDINGS Hepatobiliary: No solid liver abnormality is seen. No gallstones, gallbladder wall thickening, or biliary dilatation. Pancreas: Unremarkable. No pancreatic ductal dilatation or surrounding inflammatory changes. Spleen: Normal in size without significant abnormality. Adrenals/Urinary Tract: Adrenal glands are unremarkable. Lobulated renal cortical scarring. Small nonobstructive calculus of the superior pole of the left kidney (series 5, image 102). No right-sided calculi, ureteral calculi, or hydronephrosis. Bladder is unremarkable. Stomach/Bowel: Stomach is within normal limits. Appendix appears normal. No evidence of bowel wall thickening, distention, or  inflammatory changes. Vascular/Lymphatic: Aortic atherosclerosis. No enlarged abdominal or pelvic lymph nodes. Reproductive: Calcified uterine fibroids Other: No abdominal wall hernia or abnormality. No ascites. Musculoskeletal: No acute osseous findings. IMPRESSION: 1. Enlarged anterior mediastinal lymph node or soft tissue mass measuring 4.0 x 2.9 cm. Mildly enlarged high right paratracheal lymph node measuring 1.3 x 0.9 cm. Findings are highly concerning for malignancy, thymoma and lymphoma substantial differential considerations. Consider PET-CT for metabolic characterization and tissue sampling. 2. No other evidence of lymphadenopathy or metastatic disease in the chest, abdomen, or pelvis. Normal spleen. 3. Cardiomegaly. 4. Nonobstructive left nephrolithiasis. Aortic Atherosclerosis (ICD10-I70.0). Electronically Signed   By: Marolyn JONETTA Jaksch M.D.   On: 05/22/2024 21:36   CT Cervical Spine Wo Contrast Result Date: 05/18/2024 EXAM: CT CERVICAL SPINE WITHOUT CONTRAST 05/18/2024 04:02:00 PM TECHNIQUE: CT of the cervical spine was performed without the administration of intravenous contrast. Multiplanar reformatted images are provided for review. Automated exposure control, iterative reconstruction, and/or weight based adjustment of the mA/kV was utilized to reduce the radiation dose to as low as reasonably achievable. COMPARISON: MRI cervical spine 04/07/2013. CLINICAL HISTORY: Cervical radiculopathy, infection suspected, R neck pain x 2 days. Denies known injury, fever. FINDINGS: CERVICAL SPINE: BONES AND ALIGNMENT: Straightening of the normal cervical lordosis. 2 mm degenerative anterolisthesis at C7-T1 and T1-T2. 1.5 mm anterolisthesis at C3-C4. No fracture or acute bony findings identified. DEGENERATIVE CHANGES: Anterior and posterior interbody spurring at C6-C7. Anterior interbody spurring at C4-C5 and C5-C6. C2-C3: No impingement. Bilateral degenerative facet arthropathy. C3-C4: No impingement. Central disc  protrusion and right degenerative facet arthropathy. C4-C5: Unremarkable. C5-C6: No impingement. Disc bulge noted. C6-C7: No impingement. Disc osteophyte complex/interbody spurring noted. C7-T1: No impingement. Bilateral degenerative facet arthropathy. T1-T2: No impingement. Bilateral degenerative facet arthropathy. SOFT TISSUES: No prevertebral soft tissue swelling. IMPRESSION: 1. No acute abnormality of the cervical spine. 2. Degenerative anterolisthesis: 1.5 mm at C3-4; 2 mm at C7-T1 and T1-2. 3. Degenerative facet arthropathy: bilateral at C2-3, C7-T1, and T1-2; right-sided at C3-4. 4. Disc pathology: central disc protrusion at C3-4; disc bulge at C5-6; disc osteophyte complex/interbody spurring at C6-7; anterior interbody spurring at C4-5 and C5-6; anterior and posterior interbody spurring at C6-7. 5. Straightening of the normal cervical lordosis. No overt impingement. Electronically signed by: Ryan Salvage MD 05/18/2024 04:11 PM EDT RP Workstation: [id number]    Labs:  CBC: Recent Labs    05/07/24 1334  WBC 3.4*  HGB 10.7*  HCT 31.3*  PLT 62*    COAGS: No results for input(s): INR, APTT in the last 8760 hours.  BMP: Recent Labs    05/07/24 1334  NA 140  K 3.8  CL 106  CO2 31  GLUCOSE 158*  BUN 15  CALCIUM  9.5  CREATININE 1.03*  GFRNONAA >60    LIVER FUNCTION TESTS: Recent Labs    05/07/24 1334  BILITOT 0.5  AST 17  ALT 14  ALKPHOS 61  PROT 7.5  ALBUMIN 4.0    TUMOR MARKERS: No results for input(s): AFPTM, CEA, CA199, CHROMGRNA in the last 8760 hours.  Assessment and Plan:  58 y/o F with history of pancytopenia of unknown etiology who presents today for bone marrow biopsy for further evaluation.  Risks and benefits of bone marrow aspiration/biopsy was discussed with the patient and/or patient's family including, but not limited to bleeding, infection, damage to adjacent structures or low yield requiring additional tests.  All of the  questions were answered and there is agreement to proceed.  Consent signed and in chart.  Thank you for this interesting consult.  I greatly enjoyed meeting Jamera Vanloan and look forward to participating in their care.  A copy of this report was sent to the requesting provider on this date.  Electronically Signed: Clotilda DELENA Hesselbach, PA-C 06/12/2024, 8:21 AM   I spent a total of  30 Minutes  in face to face in clinical consultation, greater than 50% of which was counseling/coordinating care for pancytopenia.

## 2024-06-12 NOTE — Procedures (Signed)
 Interventional Radiology Procedure Note  Procedure: Fluoro guided bone marrow aspiration and biopsy  Complications: None  EBL: < 10 mL  Findings: Aspirate and core biopsy performed of bone marrow in right iliac bone.  Plan: Bedrest supine x 1 hrs  Jaedin Trumbo T. Luverne, M.D Pager:  [phone]

## 2024-06-14 DIAGNOSIS — G4733 Obstructive sleep apnea (adult) (pediatric): Secondary | ICD-10-CM | POA: Diagnosis not present

## 2024-06-14 DIAGNOSIS — E669 Obesity, unspecified: Secondary | ICD-10-CM | POA: Diagnosis not present

## 2024-06-14 LAB — SURGICAL PATHOLOGY

## 2024-06-19 ENCOUNTER — Encounter: Payer: Self-pay | Admitting: Hematology and Oncology

## 2024-06-19 ENCOUNTER — Inpatient Hospital Stay

## 2024-06-19 ENCOUNTER — Inpatient Hospital Stay (HOSPITAL_BASED_OUTPATIENT_CLINIC_OR_DEPARTMENT_OTHER): Admitting: Hematology and Oncology

## 2024-06-19 VITALS — BP 138/71 | HR 76 | Temp 98.1°F | Resp 18 | Ht 64.0 in | Wt 181.4 lb

## 2024-06-19 DIAGNOSIS — D61818 Other pancytopenia: Secondary | ICD-10-CM

## 2024-06-19 DIAGNOSIS — Z114 Encounter for screening for human immunodeficiency virus [HIV]: Secondary | ICD-10-CM

## 2024-06-19 DIAGNOSIS — E328 Other diseases of thymus: Secondary | ICD-10-CM

## 2024-06-19 LAB — CBC WITH DIFFERENTIAL (CANCER CENTER ONLY)
Abs Immature Granulocytes: 0 K/uL (ref 0.00–0.07)
Basophils Absolute: 0 K/uL (ref 0.0–0.1)
Basophils Relative: 1 %
Eosinophils Absolute: 0.1 K/uL (ref 0.0–0.5)
Eosinophils Relative: 2 %
HCT: 33.9 % — ABNORMAL LOW (ref 36.0–46.0)
Hemoglobin: 11.8 g/dL — ABNORMAL LOW (ref 12.0–15.0)
Immature Granulocytes: 0 %
Lymphocytes Relative: 26 %
Lymphs Abs: 1 K/uL (ref 0.7–4.0)
MCH: 29.4 pg (ref 26.0–34.0)
MCHC: 34.8 g/dL (ref 30.0–36.0)
MCV: 84.3 fL (ref 80.0–100.0)
Monocytes Absolute: 0.9 K/uL (ref 0.1–1.0)
Monocytes Relative: 21 %
Neutro Abs: 2.1 K/uL (ref 1.7–7.7)
Neutrophils Relative %: 50 %
Platelet Count: 61 K/uL — ABNORMAL LOW (ref 150–400)
RBC: 4.02 MIL/uL (ref 3.87–5.11)
RDW: 14.4 % (ref 11.5–15.5)
WBC Count: 4.1 K/uL (ref 4.0–10.5)
nRBC: 0 % (ref 0.0–0.2)

## 2024-06-19 LAB — CMP (CANCER CENTER ONLY)
ALT: 18 U/L (ref 0–44)
AST: 20 U/L (ref 15–41)
Albumin: 4.3 g/dL (ref 3.5–5.0)
Alkaline Phosphatase: 69 U/L (ref 38–126)
Anion gap: 4 — ABNORMAL LOW (ref 5–15)
BUN: 11 mg/dL (ref 6–20)
CO2: 32 mmol/L (ref 22–32)
Calcium: 10 mg/dL (ref 8.9–10.3)
Chloride: 102 mmol/L (ref 98–111)
Creatinine: 1.05 mg/dL — ABNORMAL HIGH (ref 0.44–1.00)
GFR, Estimated: >60 mL/min (ref >60)
Glucose, Bld: 231 mg/dL — ABNORMAL HIGH (ref 70–99)
Potassium: 3.6 mmol/L (ref 3.5–5.1)
Sodium: 138 mmol/L (ref 135–145)
Total Bilirubin: 0.6 mg/dL (ref 0.0–1.2)
Total Protein: 8.4 g/dL — ABNORMAL HIGH (ref 6.5–8.1)

## 2024-06-19 LAB — URIC ACID: Uric Acid, Serum: 4.6 mg/dL (ref 2.5–7.1)

## 2024-06-19 LAB — LACTATE DEHYDROGENASE: LDH: 229 U/L — ABNORMAL HIGH (ref 98–192)

## 2024-06-19 LAB — HEPATITIS B SURFACE ANTIBODY,QUALITATIVE: Hep B S Ab: REACTIVE — AB

## 2024-06-19 LAB — HEPATITIS B CORE ANTIBODY, IGM: Hep B C IgM: NONREACTIVE

## 2024-06-19 LAB — HEPATITIS C ANTIBODY: HCV Ab: NONREACTIVE

## 2024-06-19 LAB — HIV ANTIBODY (ROUTINE TESTING W REFLEX): HIV Screen 4th Generation wRfx: NONREACTIVE

## 2024-06-19 LAB — HEPATITIS B SURFACE ANTIGEN: Hepatitis B Surface Ag: NONREACTIVE

## 2024-06-19 NOTE — Assessment & Plan Note (Addendum)
 I have reviewed recent bone marrow biopsy result with the patient and her husband No conclusive cause of pancytopenia is found but presumably with elevated plasma cells, polyclonal in nature, I suspect she might have some undiagnosed autoimmune disorder I think she could benefit from steroid treatment but we need to rule out other infectious causes such as viral illness Additional test results from her bone marrow biopsy including cytogenetics and next-generation sequencing tests are still pending I will order additional labs today and I will see her back in 2 weeks for final discussion about plan of care

## 2024-06-19 NOTE — Progress Notes (Signed)
 [facility] OFFICE PROGRESS NOTE  Patient Care Team: Catalina Bare, MD as PCP - General (Internal Medicine) Brantley Nancyann SQUIBB, MD (Thoracic Surgery) Maye Ade, MD (Inactive) (Cardiology)  Assessment & Plan Other pancytopenia [facility]) I have reviewed recent bone marrow biopsy result with the patient and her husband No conclusive cause of pancytopenia is found but presumably with elevated plasma cells, polyclonal in nature, I suspect she might have some undiagnosed autoimmune disorder I think she could benefit from steroid treatment but we need to rule out other infectious causes such as viral illness Additional test results from her bone marrow biopsy including cytogenetics and next-generation sequencing tests are still pending I will order additional labs today and I will see her back in 2 weeks for final discussion about plan of care Thymic mass I reviewed recent imaging study with the patient I reviewed her prior CT imaging from 2015 She has anterior mediastinal mass of unknown etiology PET/CT imaging showed no avid activity This is not malignant in nature She does not need further follow-up  Orders Placed This Encounter  Procedures   Hepatitis C antibody    Standing Status:   Future    Number of Occurrences:   1    Expected Date:   06/19/2024    Expiration Date:   06/19/2025     Almarie Bedford, MD  INTERVAL HISTORY: she returns for surveillance follow-up and review of PET/CT imaging and bone marrow result She is here accompanied by her husband She is doing well and tolerated recent bone marrow procedure well I reviewed recent PET/CT imaging and we discussed recent bone marrow test results  PHYSICAL EXAMINATION: ECOG PERFORMANCE STATUS: 0 - Asymptomatic  Vitals:   06/19/24 1507  BP: 138/71  Pulse: 76  Resp: 18  Temp: 98.1 F (36.7 C)  SpO2: 100%   Filed Weights   06/19/24 1507  Weight: 181 lb 6.4 oz (82.3 kg)    Relevant data reviewed  during this visit included PET/CT imaging, bone marrow biopsy result

## 2024-06-19 NOTE — Assessment & Plan Note (Addendum)
 I reviewed recent imaging study with the patient I reviewed her prior CT imaging from 2015 She has anterior mediastinal mass of unknown etiology PET/CT imaging showed no avid activity This is not malignant in nature She does not need further follow-up

## 2024-06-20 ENCOUNTER — Encounter (HOSPITAL_COMMUNITY): Payer: Self-pay | Admitting: Hematology and Oncology

## 2024-06-25 ENCOUNTER — Encounter (HOSPITAL_COMMUNITY): Payer: Self-pay

## 2024-06-25 ENCOUNTER — Ambulatory Visit (HOSPITAL_COMMUNITY)

## 2024-06-25 ENCOUNTER — Other Ambulatory Visit (HOSPITAL_COMMUNITY)

## 2024-07-02 ENCOUNTER — Encounter: Payer: Self-pay | Admitting: Hematology and Oncology

## 2024-07-02 ENCOUNTER — Inpatient Hospital Stay: Attending: Hematology and Oncology | Admitting: Hematology and Oncology

## 2024-07-02 VITALS — BP 139/79 | HR 68 | Temp 98.0°F | Resp 18 | Ht 64.0 in | Wt 181.0 lb

## 2024-07-02 DIAGNOSIS — D61818 Other pancytopenia: Secondary | ICD-10-CM

## 2024-07-02 DIAGNOSIS — Z8719 Personal history of other diseases of the digestive system: Secondary | ICD-10-CM | POA: Diagnosis not present

## 2024-07-02 DIAGNOSIS — E1140 Type 2 diabetes mellitus with diabetic neuropathy, unspecified: Secondary | ICD-10-CM

## 2024-07-02 DIAGNOSIS — D693 Immune thrombocytopenic purpura: Secondary | ICD-10-CM | POA: Diagnosis not present

## 2024-07-02 MED ORDER — PREDNISONE 20 MG PO TABS: 60.0000 mg | ORAL_TABLET | Freq: Every day | ORAL | 0 refills | Status: DC

## 2024-07-02 NOTE — Assessment & Plan Note (Addendum)
 She could be at risk of uncontrolled hyper glycemia while on prednisone  We discussed importance of dietary modification while on prednisone 

## 2024-07-02 NOTE — Assessment & Plan Note (Addendum)
 She was evaluated by another hematologist in 2020 for pancytopenia I saw her in September 2025 and ordered extensive workup Myeloma panel, autoimmune screen, infectious disease workup were all negative.  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

## 2024-07-02 NOTE — Progress Notes (Signed)
 [facility] OFFICE PROGRESS NOTE  Patient Care Team: Catalina Bare, MD as PCP - General (Internal Medicine) Brantley Nancyann SQUIBB, MD (Thoracic Surgery) Maye Ade, MD (Inactive) (Cardiology)  Assessment & Plan Other pancytopenia [facility]) She was evaluated by another hematologist in 2020 for pancytopenia I saw her in September 2025 and ordered extensive workup Myeloma panel, autoimmune screen, infectious disease workup were all negative.  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; as ITP is a diagnosis of exclusion, this fits the picture Her anemia is likely related to chronic illness related to her diabetes 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 I recommend a trial of prednisone  at 60 mg daily We discussed risk, benefits, side effects of prednisone  including risk of uncontrolled hyperglycemia, fluid retention, insomnia, gastritis and others and she is willing to proceed She will start prednisone  at 60 mg on July 03, 2024 I will see her next week to repeat labs and to assess efficacy Type 2 diabetes mellitus with diabetic neuropathy, without long-term current use of insulin  (HCC) She could be at risk of uncontrolled hyper glycemia while on prednisone  We discussed importance of dietary modification while on prednisone   Orders Placed This Encounter  Procedures   CBC with Differential/Platelet    Standing Status:   Standing    Number of Occurrences:   22    Expiration Date:   07/02/2025     Vanessa Bedford,  MD  INTERVAL HISTORY: she returns for surveillance follow-up to review test results Final bone marrow biopsy including cytogenetics and MDS panel came back negative for malignancy Patient denies recent bleeding such as epistaxis, hematuria or hematochezia We reviewed medication list and discussed medication changes We discussed test results and future plan of care as outlined above  PHYSICAL EXAMINATION: ECOG PERFORMANCE STATUS: 0 - Asymptomatic  Vitals:   07/02/24 1523  BP: 139/79  Pulse: 68  Resp: 18  Temp: 98 F (36.7 C)  SpO2: 100%   Lab Results  Component Value Date   WBC 4.1 06/19/2024   HGB 11.8 (L) 06/19/2024   HCT 33.9 (L) 06/19/2024   MCV 84.3 06/19/2024   PLT 61 (L) 06/19/2024   SUMMARY OF HEMATOLOGIC HISTORY: She was evaluated by another hematologist in 2020 for pancytopenia I saw her in September 2025 and ordered extensive workup Myeloma panel, autoimmune screen, infectious disease workup were all negative.  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

## 2024-07-03 DIAGNOSIS — G890 Central pain syndrome: Secondary | ICD-10-CM | POA: Diagnosis not present

## 2024-07-03 DIAGNOSIS — M545 Low back pain, unspecified: Secondary | ICD-10-CM | POA: Diagnosis not present

## 2024-07-03 DIAGNOSIS — M25561 Pain in right knee: Secondary | ICD-10-CM | POA: Diagnosis not present

## 2024-07-03 DIAGNOSIS — M5137 Other intervertebral disc degeneration, lumbosacral region with discogenic back pain only: Secondary | ICD-10-CM | POA: Diagnosis not present

## 2024-07-10 ENCOUNTER — Inpatient Hospital Stay (HOSPITAL_BASED_OUTPATIENT_CLINIC_OR_DEPARTMENT_OTHER): Admitting: Hematology and Oncology

## 2024-07-10 ENCOUNTER — Encounter: Payer: Self-pay | Admitting: Hematology and Oncology

## 2024-07-10 ENCOUNTER — Inpatient Hospital Stay

## 2024-07-10 VITALS — BP 132/69 | HR 69 | Temp 97.7°F | Resp 18 | Ht 64.0 in | Wt 182.4 lb

## 2024-07-10 DIAGNOSIS — E1169 Type 2 diabetes mellitus with other specified complication: Secondary | ICD-10-CM | POA: Diagnosis present

## 2024-07-10 DIAGNOSIS — D61818 Other pancytopenia: Secondary | ICD-10-CM

## 2024-07-10 DIAGNOSIS — Z8249 Family history of ischemic heart disease and other diseases of the circulatory system: Secondary | ICD-10-CM

## 2024-07-10 DIAGNOSIS — R059 Cough, unspecified: Secondary | ICD-10-CM | POA: Diagnosis present

## 2024-07-10 DIAGNOSIS — M79601 Pain in right arm: Secondary | ICD-10-CM | POA: Diagnosis present

## 2024-07-10 DIAGNOSIS — K219 Gastro-esophageal reflux disease without esophagitis: Secondary | ICD-10-CM | POA: Diagnosis present

## 2024-07-10 DIAGNOSIS — M4802 Spinal stenosis, cervical region: Secondary | ICD-10-CM | POA: Diagnosis present

## 2024-07-10 DIAGNOSIS — N200 Calculus of kidney: Secondary | ICD-10-CM | POA: Diagnosis present

## 2024-07-10 DIAGNOSIS — Z7984 Long term (current) use of oral hypoglycemic drugs: Secondary | ICD-10-CM

## 2024-07-10 DIAGNOSIS — T402X5A Adverse effect of other opioids, initial encounter: Secondary | ICD-10-CM | POA: Diagnosis present

## 2024-07-10 DIAGNOSIS — R5383 Other fatigue: Secondary | ICD-10-CM | POA: Diagnosis present

## 2024-07-10 DIAGNOSIS — M50323 Other cervical disc degeneration at C6-C7 level: Secondary | ICD-10-CM | POA: Diagnosis present

## 2024-07-10 DIAGNOSIS — R000 Tachycardia, unspecified: Secondary | ICD-10-CM | POA: Diagnosis present

## 2024-07-10 DIAGNOSIS — I10 Essential (primary) hypertension: Secondary | ICD-10-CM | POA: Diagnosis present

## 2024-07-10 DIAGNOSIS — Z79899 Other long term (current) drug therapy: Secondary | ICD-10-CM

## 2024-07-10 DIAGNOSIS — K5903 Drug induced constipation: Secondary | ICD-10-CM | POA: Diagnosis present

## 2024-07-10 DIAGNOSIS — N12 Tubulo-interstitial nephritis, not specified as acute or chronic: Secondary | ICD-10-CM | POA: Diagnosis present

## 2024-07-10 DIAGNOSIS — B9620 Unspecified Escherichia coli [E. coli] as the cause of diseases classified elsewhere: Secondary | ICD-10-CM | POA: Diagnosis present

## 2024-07-10 DIAGNOSIS — M79602 Pain in left arm: Secondary | ICD-10-CM | POA: Diagnosis present

## 2024-07-10 DIAGNOSIS — E7849 Other hyperlipidemia: Secondary | ICD-10-CM | POA: Diagnosis present

## 2024-07-10 DIAGNOSIS — D573 Sickle-cell trait: Secondary | ICD-10-CM | POA: Diagnosis present

## 2024-07-10 DIAGNOSIS — E1165 Type 2 diabetes mellitus with hyperglycemia: Secondary | ICD-10-CM | POA: Diagnosis present

## 2024-07-10 DIAGNOSIS — Z833 Family history of diabetes mellitus: Secondary | ICD-10-CM

## 2024-07-10 DIAGNOSIS — E872 Acidosis, unspecified: Secondary | ICD-10-CM | POA: Diagnosis present

## 2024-07-10 DIAGNOSIS — A419 Sepsis, unspecified organism: Principal | ICD-10-CM | POA: Diagnosis present

## 2024-07-10 LAB — CBC WITH DIFFERENTIAL/PLATELET
Abs Immature Granulocytes: 0.07 K/uL (ref 0.00–0.07)
Basophils Absolute: 0 K/uL (ref 0.0–0.1)
Basophils Relative: 0 %
Eosinophils Absolute: 0 K/uL (ref 0.0–0.5)
Eosinophils Relative: 1 %
HCT: 33.2 % — ABNORMAL LOW (ref 36.0–46.0)
Hemoglobin: 11.7 g/dL — ABNORMAL LOW (ref 12.0–15.0)
Immature Granulocytes: 1 %
Lymphocytes Relative: 25 %
Lymphs Abs: 2.2 K/uL (ref 0.7–4.0)
MCH: 29.5 pg (ref 26.0–34.0)
MCHC: 35.2 g/dL (ref 30.0–36.0)
MCV: 83.6 fL (ref 80.0–100.0)
Monocytes Absolute: 1 K/uL (ref 0.1–1.0)
Monocytes Relative: 11 %
Neutro Abs: 5.5 K/uL (ref 1.7–7.7)
Neutrophils Relative %: 62 %
Platelets: 115 K/uL — ABNORMAL LOW (ref 150–400)
RBC: 3.97 MIL/uL (ref 3.87–5.11)
RDW: 14 % (ref 11.5–15.5)
WBC: 8.7 K/uL (ref 4.0–10.5)
nRBC: 0 % (ref 0.0–0.2)

## 2024-07-10 MED ORDER — PREDNISONE 20 MG PO TABS: 40.0000 mg | ORAL_TABLET | Freq: Every day | ORAL | Status: DC

## 2024-07-10 NOTE — Assessment & Plan Note (Addendum)
 She was evaluated by another hematologist in 2020 for pancytopenia I saw her in September 2025 and ordered extensive workup Myeloma panel, autoimmune screen, infectious disease workup were all negative.  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

## 2024-07-10 NOTE — Progress Notes (Signed)
 [facility] OFFICE PROGRESS NOTE  Patient Care Team: Catalina Bare, MD as PCP - General (Internal Medicine) Brantley Nancyann SQUIBB, MD (Thoracic Surgery) Maye Ade, MD (Inactive) (Cardiology)  Assessment & Plan Other pancytopenia [facility]) She was evaluated by another hematologist in 2020 for pancytopenia I saw her in September 2025 and ordered extensive workup Myeloma panel, autoimmune screen, infectious disease workup were all negative.  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 Patient denies recent bleeding such as epistaxis, hematuria or hematochezia We reviewed medication list and discussed medication changes We discussed test results and future plan of care as outlined above  PHYSICAL EXAMINATION: ECOG PERFORMANCE STATUS: 0 -  Asymptomatic  Vitals:   07/10/24 1149  BP: 132/69  Pulse: 69  Resp: 18  Temp: 97.7 F (36.5 C)  SpO2: 97%   Lab Results  Component Value Date   WBC 8.7 07/10/2024   HGB 11.7 (L) 07/10/2024   HCT 33.2 (L) 07/10/2024   MCV 83.6 07/10/2024   PLT 115 (L) 07/10/2024   SUMMARY OF HEMATOLOGIC HISTORY:   She was evaluated by another hematologist in 2020 for pancytopenia I saw her in September 2025 and ordered extensive workup Myeloma panel, autoimmune screen, infectious disease workup were all negative.  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

## 2024-07-12 ENCOUNTER — Emergency Department (HOSPITAL_BASED_OUTPATIENT_CLINIC_OR_DEPARTMENT_OTHER)

## 2024-07-12 ENCOUNTER — Inpatient Hospital Stay (HOSPITAL_BASED_OUTPATIENT_CLINIC_OR_DEPARTMENT_OTHER)
Admission: EM | Admit: 2024-07-12 | Discharge: 2024-07-16 | DRG: 872 | Disposition: A | Discharge status: Home / Self Care | Attending: Internal Medicine | Admitting: Internal Medicine

## 2024-07-12 ENCOUNTER — Other Ambulatory Visit: Payer: Self-pay

## 2024-07-12 ENCOUNTER — Encounter (HOSPITAL_BASED_OUTPATIENT_CLINIC_OR_DEPARTMENT_OTHER): Payer: Self-pay | Admitting: Urology

## 2024-07-12 DIAGNOSIS — R059 Cough, unspecified: Secondary | ICD-10-CM | POA: Diagnosis present

## 2024-07-12 DIAGNOSIS — E872 Acidosis, unspecified: Secondary | ICD-10-CM | POA: Diagnosis present

## 2024-07-12 DIAGNOSIS — M79602 Pain in left arm: Secondary | ICD-10-CM | POA: Diagnosis present

## 2024-07-12 DIAGNOSIS — N12 Tubulo-interstitial nephritis, not specified as acute or chronic: Secondary | ICD-10-CM | POA: Diagnosis not present

## 2024-07-12 DIAGNOSIS — E1169 Type 2 diabetes mellitus with other specified complication: Secondary | ICD-10-CM | POA: Diagnosis present

## 2024-07-12 DIAGNOSIS — J9811 Atelectasis: Secondary | ICD-10-CM | POA: Diagnosis not present

## 2024-07-12 DIAGNOSIS — M4802 Spinal stenosis, cervical region: Secondary | ICD-10-CM | POA: Diagnosis present

## 2024-07-12 DIAGNOSIS — K5903 Drug induced constipation: Secondary | ICD-10-CM | POA: Diagnosis present

## 2024-07-12 DIAGNOSIS — A419 Sepsis, unspecified organism: Principal | ICD-10-CM | POA: Diagnosis present

## 2024-07-12 DIAGNOSIS — M79601 Pain in right arm: Secondary | ICD-10-CM | POA: Diagnosis present

## 2024-07-12 DIAGNOSIS — I517 Cardiomegaly: Secondary | ICD-10-CM | POA: Diagnosis not present

## 2024-07-12 DIAGNOSIS — D573 Sickle-cell trait: Secondary | ICD-10-CM | POA: Diagnosis present

## 2024-07-12 DIAGNOSIS — N200 Calculus of kidney: Secondary | ICD-10-CM | POA: Diagnosis present

## 2024-07-12 DIAGNOSIS — I10 Essential (primary) hypertension: Secondary | ICD-10-CM | POA: Diagnosis present

## 2024-07-12 DIAGNOSIS — K219 Gastro-esophageal reflux disease without esophagitis: Secondary | ICD-10-CM | POA: Diagnosis present

## 2024-07-12 DIAGNOSIS — E1165 Type 2 diabetes mellitus with hyperglycemia: Secondary | ICD-10-CM | POA: Diagnosis present

## 2024-07-12 DIAGNOSIS — Z79899 Other long term (current) drug therapy: Secondary | ICD-10-CM

## 2024-07-12 DIAGNOSIS — Z8249 Family history of ischemic heart disease and other diseases of the circulatory system: Secondary | ICD-10-CM

## 2024-07-12 DIAGNOSIS — D259 Leiomyoma of uterus, unspecified: Secondary | ICD-10-CM | POA: Diagnosis not present

## 2024-07-12 DIAGNOSIS — M50323 Other cervical disc degeneration at C6-C7 level: Secondary | ICD-10-CM | POA: Diagnosis present

## 2024-07-12 DIAGNOSIS — T402X5A Adverse effect of other opioids, initial encounter: Secondary | ICD-10-CM | POA: Diagnosis present

## 2024-07-12 DIAGNOSIS — R5383 Other fatigue: Secondary | ICD-10-CM | POA: Diagnosis present

## 2024-07-12 DIAGNOSIS — Z833 Family history of diabetes mellitus: Secondary | ICD-10-CM

## 2024-07-12 DIAGNOSIS — R6883 Chills (without fever): Secondary | ICD-10-CM | POA: Diagnosis not present

## 2024-07-12 DIAGNOSIS — N390 Urinary tract infection, site not specified: Secondary | ICD-10-CM | POA: Diagnosis present

## 2024-07-12 DIAGNOSIS — B9620 Unspecified Escherichia coli [E. coli] as the cause of diseases classified elsewhere: Secondary | ICD-10-CM | POA: Diagnosis present

## 2024-07-12 DIAGNOSIS — E7849 Other hyperlipidemia: Secondary | ICD-10-CM | POA: Diagnosis present

## 2024-07-12 DIAGNOSIS — R000 Tachycardia, unspecified: Secondary | ICD-10-CM | POA: Diagnosis present

## 2024-07-12 DIAGNOSIS — Z7984 Long term (current) use of oral hypoglycemic drugs: Secondary | ICD-10-CM

## 2024-07-12 LAB — CBC WITH DIFFERENTIAL/PLATELET
Eosinophils Relative: 0 %
HCT: 31.6 % — ABNORMAL LOW (ref 36.0–46.0)
Hemoglobin: 11 g/dL — ABNORMAL LOW (ref 12.0–15.0)
Lymphocytes Relative: 7 %
Lymphs Abs: 1.7 K/uL (ref 0.7–4.0)
MCH: 29.6 pg (ref 26.0–34.0)
MCHC: 34.8 g/dL (ref 30.0–36.0)
MCV: 85.2 fL (ref 80.0–100.0)
Monocytes Absolute: 3.6 K/uL — ABNORMAL HIGH (ref 0.1–1.0)
Monocytes Relative: 16 %
Neutro Abs: 16.8 K/uL — ABNORMAL HIGH (ref 1.7–7.7)
Neutrophils Relative %: 75 %
Platelets: 95 K/uL — ABNORMAL LOW (ref 150–400)
RBC: 3.71 MIL/uL — ABNORMAL LOW (ref 3.87–5.11)
RDW: 14.3 % (ref 11.5–15.5)
Smear Review: DECREASED
WBC: 22.4 K/uL — ABNORMAL HIGH (ref 4.0–10.5)

## 2024-07-12 LAB — RESP PANEL BY RT-PCR (RSV, FLU A&B, COVID)  RVPGX2
Influenza A by PCR: NEGATIVE
Influenza B by PCR: NEGATIVE
Resp Syncytial Virus by PCR: NEGATIVE
SARS Coronavirus 2 by RT PCR: NEGATIVE

## 2024-07-12 LAB — URINALYSIS, W/ REFLEX TO CULTURE (INFECTION SUSPECTED)
Bilirubin Urine: NEGATIVE
Glucose, UA: NEGATIVE mg/dL
Ketones, ur: NEGATIVE mg/dL
Nitrite: NEGATIVE
Protein, ur: NEGATIVE mg/dL
Specific Gravity, Urine: 1.015 (ref 1.005–1.030)
WBC, UA: >50 WBC/hpf (ref 0–5)
pH: 5.5 (ref 5.0–8.0)

## 2024-07-12 LAB — COMPREHENSIVE METABOLIC PANEL WITH GFR
ALT: 27 U/L (ref 0–44)
AST: 22 U/L (ref 15–41)
Albumin: 4 g/dL (ref 3.5–5.0)
Alkaline Phosphatase: 72 U/L (ref 38–126)
Anion gap: 11 (ref 5–15)
BUN: 19 mg/dL (ref 6–20)
CO2: 26 mmol/L (ref 22–32)
Calcium: 9.6 mg/dL (ref 8.9–10.3)
Chloride: 98 mmol/L (ref 98–111)
Creatinine, Ser: 1.23 mg/dL — ABNORMAL HIGH (ref 0.44–1.00)
GFR, Estimated: 51 mL/min — ABNORMAL LOW (ref >60)
Glucose, Bld: 250 mg/dL — ABNORMAL HIGH (ref 70–99)
Potassium: 4.5 mmol/L (ref 3.5–5.1)
Sodium: 135 mmol/L (ref 135–145)
Total Bilirubin: 0.6 mg/dL (ref 0.0–1.2)
Total Protein: 7.2 g/dL (ref 6.5–8.1)

## 2024-07-12 MED ORDER — SODIUM CHLORIDE 0.9 % IV BOLUS
1000.0000 mL | Freq: Once | INTRAVENOUS | Status: AC
  Administered 2024-07-12: 1000 mL via INTRAVENOUS

## 2024-07-12 MED ORDER — SODIUM CHLORIDE 0.9 % IV SOLN
1.0000 g | Freq: Once | INTRAVENOUS | Status: AC
  Administered 2024-07-12: 1 g via INTRAVENOUS
  Filled 2024-07-12: qty 10

## 2024-07-12 MED ORDER — IOHEXOL 300 MG/ML  SOLN
100.0000 mL | Freq: Once | INTRAMUSCULAR | Status: AC | PRN
  Administered 2024-07-12: 100 mL via INTRAVENOUS

## 2024-07-12 MED ORDER — ACETAMINOPHEN 325 MG PO TABS
650.0000 mg | ORAL_TABLET | Freq: Once | ORAL | Status: AC
  Administered 2024-07-12: 650 mg via ORAL
  Filled 2024-07-12: qty 2

## 2024-07-12 NOTE — ED Provider Notes (Signed)
 [place] [facility] Provider Note   CSN: [id number] Arrival date & time: 07/12/24  1816     Patient presents with: Arm Pain   [name] is a 58 y.o. female here presenting with leg pain and myalgias.  Patient has a history ITP and just saw oncology 2 days ago had normal white count and slightly low platelet count.  Patient states that since yesterday she has severe chills and rigors.  She states that she has diffuse myalgias.  Patient also has some dysuria cough as well.   The history is provided by the patient.       Prior to Admission medications   Medication Sig Start Date End Date Taking? Authorizing Provider  amLODipine  (NORVASC ) 10 MG tablet Take 1 tablet (10 mg total) by mouth daily. 02/26/18   Jillian Buttery, MD  Calcium  Carbonate-Vitamin D (CALCIUM -VITAMIN D) 500-200 MG-UNIT per tablet Take 1 tablet by mouth 2 (two) times daily with a meal.      [provider]  cetirizine (ZYRTEC) 10 MG tablet Take 10 mg by mouth daily. 12/29/23   [provider]  copper  tablet Take 2 mg by mouth daily.    [provider]  diclofenac  sodium (VOLTAREN ) 1 % GEL APPLY 4GM TO AFFECTED JOINTS EVERY 6 HOURS. 01/20/19   [provider]  folic acid  (FOLVITE ) 1 MG tablet Take 1 tablet (1 mg total) by mouth daily. 08/10/19   Franchot Lauraine HERO, NP  glimepiride (AMARYL) 2 MG tablet Take 2 mg by mouth daily. 02/28/18   [provider]  metFORMIN  (GLUCOPHAGE ) 500 MG tablet Take 500 mg by mouth daily.  11/22/17   [provider]  methocarbamol  (ROBAXIN ) 500 MG tablet Take 1 tablet (500 mg total) by mouth 2 (two) times daily. 05/18/24   Emil Share, DO  metoprolol  succinate (TOPROL -XL) 25 MG 24 hr tablet Take 12.5 mg by mouth daily. 11/22/17   [provider]  naproxen (NAPROSYN) 500 MG tablet Take 500 mg by mouth every 8 (eight) hours. 04/24/24   [provider]  NARCAN  4 MG/0.1ML LIQD nasal spray kit   09/10/18   [provider]  oxyCODONE -acetaminophen  (PERCOCET) 10-325 MG tablet Take 1 tablet by mouth 3 (three) times daily. 01/12/18   [provider]  predniSONE  (DELTASONE ) 20 MG tablet Take 2 tablets (40 mg total) by mouth daily with breakfast. 07/10/24   Lonn Hicks, MD  pregabalin  (LYRICA ) 150 MG capsule Take 150 mg by mouth every 12 (twelve) hours. 04/11/24   [provider]  PREMARIN vaginal cream INSERT 0.5 GRAMS VAGINALLY TWICE A WEEK FOR 4 WEEKS THEN AS NEEDED 03/13/19   [provider]  RELISTOR  150 MG TABS Take 3 tablets by mouth daily. 03/26/24   [provider]  rosuvastatin  (CRESTOR ) 20 MG tablet Take 20 mg by mouth daily. 11/22/17   [provider]  sucralfate (CARAFATE) 1 g tablet 1 tab(s) orally 4 times a day (before meals and at bedtime) WHEN NEEDED    [provider]  Vitamin D, Ergocalciferol, (DRISDOL) 50000 units CAPS capsule TAKE 1 CAPSULE BY MOUTH TWICE A WEEK 02/09/18   [provider]    Allergies: Ampicillin     Review of Systems  Constitutional:  Positive for fatigue and fever.  Musculoskeletal:  Positive for back pain and myalgias.  All other systems reviewed and are negative.   Updated Vital Signs BP (!) 149/88 (BP Location: Right Wrist)   Pulse 65  Temp 98.4 F (36.9 C) (Oral)   Resp 16   Ht 5' 4 (1.626 m)   Wt 82.7 kg   LMP 05/18/2012   SpO2 94%   BMI 31.30 kg/m   Physical Exam Vitals and nursing note reviewed.  Constitutional:      Comments: Uncomfortable and dehydrated  HENT:     Head: Normocephalic.     Nose: Nose normal.     Mouth/Throat:     Mouth: Mucous membranes are dry.  Eyes:     Extraocular Movements: Extraocular movements intact.  Cardiovascular:     Rate and Rhythm: Regular rhythm. Tachycardia present.     Pulses: Normal pulses.     Heart sounds: Normal heart sounds.  Pulmonary:     Effort: Pulmonary effort is normal.     Breath sounds: Normal breath sounds.   Abdominal:     General: Abdomen is flat.     Palpations: Abdomen is soft.     Comments: Mild bilateral CVA tenderness  Musculoskeletal:        General: Normal range of motion.     Cervical back: Normal range of motion and neck supple.  Skin:    General: Skin is warm.     Capillary Refill: Capillary refill takes less than 2 seconds.  Neurological:     General: No focal deficit present.     Mental Status: She is oriented to person, place, and time.  Psychiatric:        Mood and Affect: Mood normal.        Behavior: Behavior normal.     (all labs ordered are listed, but only abnormal results are displayed) Labs Reviewed  CBC WITH DIFFERENTIAL/PLATELET - Abnormal; Notable for the following components:      Result Value   WBC 22.4 (*)    RBC 3.71 (*)    Hemoglobin 11.0 (*)    HCT 31.6 (*)    Platelets 95 (*)    Neutro Abs 16.8 (*)    Monocytes Absolute 3.6 (*)    All other components within normal limits  COMPREHENSIVE METABOLIC PANEL WITH GFR - Abnormal; Notable for the following components:   Glucose, Bld 250 (*)    Creatinine, Ser 1.23 (*)    GFR, Estimated 51 (*)    All other components within normal limits  URINALYSIS, W/ REFLEX TO CULTURE (INFECTION SUSPECTED) - Abnormal; Notable for the following components:   APPearance HAZY (*)    Hgb urine dipstick SMALL (*)    Leukocytes,Ua SMALL (*)    Bacteria, UA MANY (*)    All other components within normal limits  RESP PANEL BY RT-PCR (RSV, FLU A&B, COVID)  RVPGX2  URINE CULTURE  CULTURE, BLOOD (ROUTINE X 2)  CULTURE, BLOOD (ROUTINE X 2)  LACTIC ACID, PLASMA  LACTIC ACID, PLASMA    EKG: None  Radiology: CT ABDOMEN PELVIS W CONTRAST Result Date: 07/12/2024 EXAM: CT ABDOMEN AND PELVIS WITH CONTRAST 07/12/2024 11:20:12 PM TECHNIQUE: CT of the abdomen and pelvis was performed with the administration of 100 mL of iohexol (OMNIPAQUE) 300 MG/ML solution. Multiplanar reformatted images are provided for review. Automated  exposure control, iterative reconstruction, and/or weight-based adjustment of the mA/kV was utilized to reduce the radiation dose to as low as reasonably achievable. COMPARISON: CT abdomen and pelvis 05/22/2024. pet ct 10/10/225 CLINICAL HISTORY: Sepsis. FINDINGS: LOWER CHEST: Mitral annular calcification. Moderate atherosclerotic plaque. LIVER: The liver is unremarkable. GALLBLADDER AND BILE DUCTS: Gallbladder is unremarkable. No biliary ductal dilatation. SPLEEN: No  acute abnormality. PANCREAS: No acute abnormality. ADRENAL GLANDS: No acute abnormality. KIDNEYS, URETERS AND BLADDER: Bilateral renal cortical scarring. 1 to 2 mm left nephrolithiasis. No right nephrolithiasis. No ureterolithiasis bilaterally. No hydroureteronephrosis. No perinephric or periureteral stranding. No filling defects of the partially visualized collecting systems on delayed imaging. Urinary bladder is unremarkable. GI AND BOWEL: Stomach demonstrates no acute abnormality. No small or large bowel thickening or dilatation. The appendix is unremarkable. Stool throughout the colon. PERITONEUM AND RETROPERITONEUM: No ascites. No free air. VASCULATURE: Aorta is normal in caliber. LYMPH NODES: No lymphadenopathy. REPRODUCTIVE ORGANS: Fundal calcified uterine fibroid. Otherwise, the uterus is unremarkable. No adnexal mass. BONES AND SOFT TISSUES: Stable multilevel severe degenerative changes of the spine. Stable multilevel intervertebral disc space vacuum phenomenon. No acute osseous abnormality. No focal soft tissue abnormality. IMPRESSION: 1. Stool throughout the colon. Correlate for constipation. 2. Nonobstructive 1 to 2 mm left nephrolithiasis. 3. Other, non-acute and/or normal findings as above. Electronically signed by: Morgane Naveau MD 07/12/2024 11:32 PM EST RP Workstation: [id number]   DG Chest 2 View Result Date: 07/12/2024 CLINICAL DATA:  Chills and shaking. EXAM: CHEST - 2 VIEW COMPARISON:  January 27, 2018 FINDINGS: The cardiac  silhouette is mildly enlarged and unchanged in size. Mild linear atelectasis is seen within the mid left lung. No acute infiltrate, pleural effusion or pneumothorax is identified. No acute osseous abnormalities are identified. IMPRESSION: Mild cardiomegaly with mild mid left lung linear atelectasis. Electronically Signed   By: Suzen Dials M.D.   On: 07/12/2024 22:09     Procedures   CRITICAL CARE Performed by: Alm VEAR Cave   Total critical care time: 45 minutes  Critical care time was exclusive of separately billable procedures and treating other patients.  Critical care was necessary to treat or prevent imminent or life-threatening deterioration.  Critical care was time spent personally by me on the following activities: development of treatment plan with patient and/or surrogate as well as nursing, discussions with consultants, evaluation of patient's response to treatment, examination of patient, obtaining history from patient or surrogate, ordering and performing treatments and interventions, ordering and review of laboratory studies, ordering and review of radiographic studies, pulse oximetry and re-evaluation of patient's condition.   Medications Ordered in the ED  sodium chloride  0.9 % bolus 1,000 mL (0 mLs Intravenous Stopped 07/12/24 2247)  acetaminophen  (TYLENOL ) tablet 650 mg (650 mg Oral Given 07/12/24 2102)  cefTRIAXone  (ROCEPHIN ) 1 g in sodium chloride  0.9 % 100 mL IVPB (0 g Intravenous Stopped 07/12/24 2247)  iohexol (OMNIPAQUE) 300 MG/ML solution 100 mL (100 mLs Intravenous Contrast Given 07/12/24 2300)                                    Medical Decision Making Oliviarose Punch is a 58 y.o. female who presenting with myalgias and flank pain and fever and chills.  Consider viral syndrome versus pneumonia versus pyelonephritis versus significant abdominal infection.  Plan to get CBC and CMP and UA and chest x-ray.  10 pm White blood cell count is 22,000.  UA showed  UTI.  I ordered CT abdomen pelvis.  I also ordered Rocephin   11:46 PM CT abdomen pelvis showed no infected kidney stone.  However patient is still feeling unwell and will need admission for pyelonephritis.  Blood culture and urine culture added.  Lactate is pending.  Signed out to Dr. Roselyn to admit patient for pyelonephritis.  Problems Addressed: Pyelonephritis: acute illness or injury  Amount and/or Complexity of Data Reviewed Labs: ordered. Decision-making details documented in ED Course. Radiology: ordered and independent interpretation performed. Decision-making details documented in ED Course.  Risk OTC drugs. Prescription drug management. Decision regarding hospitalization.    Final diagnoses:  Pyelonephritis    ED Discharge Orders     None          Patt Alm Macho, MD 07/12/24 [phone]

## 2024-07-12 NOTE — ED Triage Notes (Signed)
 Pt states woke up this am with chills and shaking, states pain in both arms really bad  Denies headache or other symptoms  No redness or swelling noted

## 2024-07-13 ENCOUNTER — Observation Stay (HOSPITAL_COMMUNITY)

## 2024-07-13 DIAGNOSIS — B9620 Unspecified Escherichia coli [E. coli] as the cause of diseases classified elsewhere: Secondary | ICD-10-CM | POA: Diagnosis present

## 2024-07-13 DIAGNOSIS — K5900 Constipation, unspecified: Secondary | ICD-10-CM | POA: Diagnosis not present

## 2024-07-13 DIAGNOSIS — M4802 Spinal stenosis, cervical region: Secondary | ICD-10-CM | POA: Diagnosis present

## 2024-07-13 DIAGNOSIS — R059 Cough, unspecified: Secondary | ICD-10-CM | POA: Diagnosis present

## 2024-07-13 DIAGNOSIS — I878 Other specified disorders of veins: Secondary | ICD-10-CM | POA: Diagnosis not present

## 2024-07-13 DIAGNOSIS — Z8249 Family history of ischemic heart disease and other diseases of the circulatory system: Secondary | ICD-10-CM | POA: Diagnosis not present

## 2024-07-13 DIAGNOSIS — E1165 Type 2 diabetes mellitus with hyperglycemia: Secondary | ICD-10-CM | POA: Diagnosis present

## 2024-07-13 DIAGNOSIS — M4312 Spondylolisthesis, cervical region: Secondary | ICD-10-CM | POA: Diagnosis not present

## 2024-07-13 DIAGNOSIS — N12 Tubulo-interstitial nephritis, not specified as acute or chronic: Principal | ICD-10-CM | POA: Diagnosis present

## 2024-07-13 DIAGNOSIS — M79601 Pain in right arm: Secondary | ICD-10-CM | POA: Diagnosis present

## 2024-07-13 DIAGNOSIS — T402X5A Adverse effect of other opioids, initial encounter: Secondary | ICD-10-CM | POA: Diagnosis present

## 2024-07-13 DIAGNOSIS — Z7984 Long term (current) use of oral hypoglycemic drugs: Secondary | ICD-10-CM | POA: Diagnosis not present

## 2024-07-13 DIAGNOSIS — E1169 Type 2 diabetes mellitus with other specified complication: Secondary | ICD-10-CM | POA: Diagnosis present

## 2024-07-13 DIAGNOSIS — R5383 Other fatigue: Secondary | ICD-10-CM | POA: Diagnosis present

## 2024-07-13 DIAGNOSIS — N200 Calculus of kidney: Secondary | ICD-10-CM | POA: Diagnosis present

## 2024-07-13 DIAGNOSIS — N3000 Acute cystitis without hematuria: Secondary | ICD-10-CM

## 2024-07-13 DIAGNOSIS — K5903 Drug induced constipation: Secondary | ICD-10-CM | POA: Diagnosis present

## 2024-07-13 DIAGNOSIS — M79602 Pain in left arm: Secondary | ICD-10-CM | POA: Diagnosis present

## 2024-07-13 DIAGNOSIS — N390 Urinary tract infection, site not specified: Secondary | ICD-10-CM | POA: Diagnosis present

## 2024-07-13 DIAGNOSIS — M4722 Other spondylosis with radiculopathy, cervical region: Secondary | ICD-10-CM | POA: Diagnosis not present

## 2024-07-13 DIAGNOSIS — Z79899 Other long term (current) drug therapy: Secondary | ICD-10-CM | POA: Diagnosis not present

## 2024-07-13 DIAGNOSIS — K219 Gastro-esophageal reflux disease without esophagitis: Secondary | ICD-10-CM | POA: Diagnosis present

## 2024-07-13 DIAGNOSIS — R000 Tachycardia, unspecified: Secondary | ICD-10-CM | POA: Diagnosis present

## 2024-07-13 DIAGNOSIS — E7849 Other hyperlipidemia: Secondary | ICD-10-CM | POA: Diagnosis present

## 2024-07-13 DIAGNOSIS — E872 Acidosis, unspecified: Secondary | ICD-10-CM | POA: Diagnosis present

## 2024-07-13 DIAGNOSIS — Z833 Family history of diabetes mellitus: Secondary | ICD-10-CM | POA: Diagnosis not present

## 2024-07-13 DIAGNOSIS — D573 Sickle-cell trait: Secondary | ICD-10-CM | POA: Diagnosis present

## 2024-07-13 DIAGNOSIS — I10 Essential (primary) hypertension: Secondary | ICD-10-CM | POA: Diagnosis present

## 2024-07-13 DIAGNOSIS — M50323 Other cervical disc degeneration at C6-C7 level: Secondary | ICD-10-CM | POA: Diagnosis present

## 2024-07-13 DIAGNOSIS — A419 Sepsis, unspecified organism: Secondary | ICD-10-CM | POA: Diagnosis present

## 2024-07-13 LAB — GLUCOSE, CAPILLARY
Glucose-Capillary: 195 mg/dL — ABNORMAL HIGH (ref 70–99)
Glucose-Capillary: 222 mg/dL — ABNORMAL HIGH (ref 70–99)
Glucose-Capillary: 235 mg/dL — ABNORMAL HIGH (ref 70–99)
Glucose-Capillary: 374 mg/dL — ABNORMAL HIGH (ref 70–99)
Glucose-Capillary: 401 mg/dL — ABNORMAL HIGH (ref 70–99)
Glucose-Capillary: 94 mg/dL (ref 70–99)

## 2024-07-13 LAB — CBC
HCT: 30.5 % — ABNORMAL LOW (ref 36.0–46.0)
Hemoglobin: 10.4 g/dL — ABNORMAL LOW (ref 12.0–15.0)
MCH: 30 pg (ref 26.0–34.0)
MCHC: 34.1 g/dL (ref 30.0–36.0)
MCV: 87.9 fL (ref 80.0–100.0)
Platelets: 80 K/uL — ABNORMAL LOW (ref 150–400)
RBC: 3.47 MIL/uL — ABNORMAL LOW (ref 3.87–5.11)
RDW: 14.8 % (ref 11.5–15.5)
WBC: 19.8 K/uL — ABNORMAL HIGH (ref 4.0–10.5)
nRBC: 0.2 % (ref 0.0–0.2)

## 2024-07-13 LAB — BASIC METABOLIC PANEL WITH GFR
Anion gap: 9 (ref 5–15)
BUN: 16 mg/dL (ref 6–20)
CO2: 25 mmol/L (ref 22–32)
Calcium: 9.4 mg/dL (ref 8.9–10.3)
Chloride: 105 mmol/L (ref 98–111)
Creatinine, Ser: 1.21 mg/dL — ABNORMAL HIGH (ref 0.44–1.00)
GFR, Estimated: 52 mL/min — ABNORMAL LOW (ref >60)
Glucose, Bld: 192 mg/dL — ABNORMAL HIGH (ref 70–99)
Potassium: 4.1 mmol/L (ref 3.5–5.1)
Sodium: 139 mmol/L (ref 135–145)

## 2024-07-13 LAB — PROTIME-INR
INR: 1.3 — ABNORMAL HIGH (ref 0.8–1.2)
Prothrombin Time: 17.1 s — ABNORMAL HIGH (ref 11.4–15.2)

## 2024-07-13 LAB — LACTIC ACID, PLASMA: Lactic Acid, Venous: 2 mmol/L (ref 0.5–1.9)

## 2024-07-13 MED ORDER — INSULIN ASPART 100 UNIT/ML IJ SOLN
0.0000 [IU] | Freq: Three times a day (TID) | INTRAMUSCULAR | Status: DC
  Administered 2024-07-13 (×2): 8 [IU] via SUBCUTANEOUS
  Administered 2024-07-13: 4 [IU] via SUBCUTANEOUS
  Administered 2024-07-14: 8 [IU] via SUBCUTANEOUS
  Administered 2024-07-14: 12 [IU] via SUBCUTANEOUS
  Administered 2024-07-14: 4 [IU] via SUBCUTANEOUS
  Administered 2024-07-15 (×2): 8 [IU] via SUBCUTANEOUS
  Administered 2024-07-15 – 2024-07-16 (×3): 4 [IU] via SUBCUTANEOUS
  Filled 2024-07-13: qty 4
  Filled 2024-07-13 (×2): qty 8
  Filled 2024-07-13: qty 4
  Filled 2024-07-13: qty 8
  Filled 2024-07-13: qty 4
  Filled 2024-07-13 (×2): qty 8
  Filled 2024-07-13 (×2): qty 4
  Filled 2024-07-13: qty 12

## 2024-07-13 MED ORDER — INSULIN ASPART 100 UNIT/ML IJ SOLN
10.0000 [IU] | Freq: Once | INTRAMUSCULAR | Status: AC
  Administered 2024-07-13: 10 [IU] via SUBCUTANEOUS
  Filled 2024-07-13: qty 10

## 2024-07-13 MED ORDER — DICLOFENAC SODIUM 1 % TD GEL: 2.0000 g | Freq: Four times a day (QID) | TRANSDERMAL | Status: DC | PRN

## 2024-07-13 MED ORDER — ACETAMINOPHEN 325 MG PO TABS
650.0000 mg | ORAL_TABLET | Freq: Four times a day (QID) | ORAL | Status: DC | PRN
  Administered 2024-07-13: 650 mg via ORAL
  Filled 2024-07-13: qty 2

## 2024-07-13 MED ORDER — ONDANSETRON HCL 4 MG/2ML IJ SOLN: 4.0000 mg | Freq: Four times a day (QID) | INTRAMUSCULAR | Status: DC | PRN

## 2024-07-13 MED ORDER — METHYLNALTREXONE BROMIDE 150 MG PO TABS
3.0000 | ORAL_TABLET | Freq: Every day | ORAL | Status: DC
  Administered 2024-07-14 – 2024-07-16 (×3): 450 mg via ORAL
  Filled 2024-07-13 (×8): qty 3

## 2024-07-13 MED ORDER — METOPROLOL SUCCINATE ER 25 MG PO TB24
25.0000 mg | ORAL_TABLET | Freq: Every day | ORAL | Status: DC
  Administered 2024-07-13 – 2024-07-16 (×4): 25 mg via ORAL
  Filled 2024-07-13 (×3): qty 1

## 2024-07-13 MED ORDER — KETOROLAC TROMETHAMINE 30 MG/ML IJ SOLN
30.0000 mg | Freq: Once | INTRAMUSCULAR | Status: AC
  Administered 2024-07-13: 30 mg via INTRAVENOUS
  Filled 2024-07-13: qty 1

## 2024-07-13 MED ORDER — NALOXONE HCL 4 MG/0.1ML NA LIQD: 1.0000 | Freq: Once | NASAL | Status: DC

## 2024-07-13 MED ORDER — OXYCODONE-ACETAMINOPHEN 10-325 MG PO TABS: 1.0000 | ORAL_TABLET | Freq: Three times a day (TID) | ORAL | Status: DC

## 2024-07-13 MED ORDER — OXYCODONE HCL 5 MG PO TABS
10.0000 mg | ORAL_TABLET | Freq: Three times a day (TID) | ORAL | Status: DC
  Administered 2024-07-13 – 2024-07-16 (×9): 10 mg via ORAL
  Filled 2024-07-13 (×9): qty 2

## 2024-07-13 MED ORDER — NALOXONE HCL 0.4 MG/ML IJ SOLN: 0.4000 mg | INTRAMUSCULAR | Status: DC | PRN

## 2024-07-13 MED ORDER — OXYCODONE HCL 5 MG PO TABS
5.0000 mg | ORAL_TABLET | Freq: Four times a day (QID) | ORAL | Status: DC | PRN
  Administered 2024-07-13 (×2): 5 mg via ORAL
  Filled 2024-07-13 (×2): qty 1

## 2024-07-13 MED ORDER — ACETAMINOPHEN 650 MG RE SUPP: 650.0000 mg | Freq: Four times a day (QID) | RECTAL | Status: DC | PRN

## 2024-07-13 MED ORDER — PREGABALIN 75 MG PO CAPS
150.0000 mg | ORAL_CAPSULE | Freq: Two times a day (BID) | ORAL | Status: DC
  Administered 2024-07-13 – 2024-07-16 (×7): 150 mg via ORAL
  Filled 2024-07-13 (×7): qty 2

## 2024-07-13 MED ORDER — DEXAMETHASONE 4 MG PO TABS
4.0000 mg | ORAL_TABLET | Freq: Four times a day (QID) | ORAL | Status: AC
  Administered 2024-07-13 – 2024-07-15 (×8): 4 mg via ORAL
  Filled 2024-07-13 (×8): qty 1

## 2024-07-13 MED ORDER — ACETAMINOPHEN 325 MG PO TABS
325.0000 mg | ORAL_TABLET | Freq: Three times a day (TID) | ORAL | Status: DC
  Administered 2024-07-13 – 2024-07-16 (×9): 325 mg via ORAL
  Filled 2024-07-13 (×9): qty 1

## 2024-07-13 MED ORDER — ENOXAPARIN SODIUM 40 MG/0.4ML IJ SOSY
40.0000 mg | PREFILLED_SYRINGE | INTRAMUSCULAR | Status: DC
  Administered 2024-07-13 – 2024-07-16 (×4): 40 mg via SUBCUTANEOUS
  Filled 2024-07-13 (×4): qty 0.4

## 2024-07-13 MED ORDER — LACTATED RINGERS IV SOLN: INTRAVENOUS | Status: DC

## 2024-07-13 MED ORDER — AMLODIPINE BESYLATE 10 MG PO TABS
10.0000 mg | ORAL_TABLET | Freq: Every day | ORAL | Status: DC
  Administered 2024-07-13 – 2024-07-16 (×4): 10 mg via ORAL
  Filled 2024-07-13 (×4): qty 1

## 2024-07-13 MED ORDER — GADOBUTROL 1 MMOL/ML IV SOLN
8.0000 mL | Freq: Once | INTRAVENOUS | Status: AC | PRN
  Administered 2024-07-13: 8 mL via INTRAVENOUS

## 2024-07-13 MED ORDER — SODIUM CHLORIDE 0.9 % IV SOLN
2.0000 g | INTRAVENOUS | Status: DC
  Administered 2024-07-13 – 2024-07-16 (×4): 2 g via INTRAVENOUS
  Filled 2024-07-13 (×5): qty 20

## 2024-07-13 MED ORDER — ROSUVASTATIN CALCIUM 10 MG PO TABS
20.0000 mg | ORAL_TABLET | Freq: Every day | ORAL | Status: DC
  Administered 2024-07-13 – 2024-07-16 (×4): 20 mg via ORAL
  Filled 2024-07-13 (×4): qty 2

## 2024-07-13 MED ORDER — SODIUM CHLORIDE 0.9 % IV BOLUS
1000.0000 mL | Freq: Once | INTRAVENOUS | Status: AC
  Administered 2024-07-13: 1000 mL via INTRAVENOUS

## 2024-07-13 MED ORDER — ONDANSETRON HCL 4 MG PO TABS: 4.0000 mg | ORAL_TABLET | Freq: Four times a day (QID) | ORAL | Status: DC | PRN

## 2024-07-13 MED ORDER — DICLOFENAC SODIUM 1 % EX GEL
2.0000 g | Freq: Four times a day (QID) | CUTANEOUS | Status: DC | PRN
  Administered 2024-07-13: 2 g via TOPICAL
  Filled 2024-07-13: qty 100

## 2024-07-13 NOTE — Care Management Obs Status (Signed)
 MEDICARE OBSERVATION STATUS NOTIFICATION   Patient Details  Name: [name] MRN: [id number] Date of Birth: 04-Mar-1966   Medicare Observation Status Notification Given:  Yes    Sonda Manuella Quill, RN 07/13/2024, 2:15 PM

## 2024-07-13 NOTE — Inpatient Diabetes Management (Addendum)
 Inpatient Diabetes Program Recommendations  AACE/ADA: New Consensus Statement on Inpatient Glycemic Control (2015)  Target Ranges:  Prepandial:   less than 140 mg/dL      Peak postprandial:   less than 180 mg/dL (1-2 hours)      Critically ill patients:  140 - 180 mg/dL   Lab Results  Component Value Date   GLUCAP 235 (H) 07/13/2024   HGBA1C 7.6 (H) 02/21/2018    Review of Glycemic Control  Latest Reference Range & Units 07/13/24 03:40 07/13/24 08:02 07/13/24 11:37  Glucose-Capillary 70 - 99 mg/dL 94 777 (H) 764 (H)  (H): Data is abnormally high  Diabetes history: DM2 Outpatient Diabetes medications: Amaryl 4 mg every day, Metformin  500 mg QD Current orders for Inpatient glycemic control: Novolog  0-24 units TID   Inpatient Diabetes Program Recommendations:    Please consider decreasing correction:  Novolog  0-15 units TID Add carb modified to diet order  Thank you, Wyvonna Pinal, MSN, CDCES Diabetes Coordinator Inpatient Diabetes Program [phone] (team pager from 8a-5p)

## 2024-07-13 NOTE — TOC Initial Note (Signed)
 Transition of Care [facility]) - Initial/Assessment Note    Patient Details  Name: [name] MRN: [id number] Date of Birth: 1965-11-01  Transition of Care [facility]) CM/SW Contact:    Sonda Manuella Quill, RN Phone Number: 07/13/2024, 2:49 PM  Clinical Narrative:                 Pt asleep; spoke w/ spouse Iridian Reader at bedside; he said pt lives at home; he plans for her to return w/ his support ad d/c; he will also provide transportation; Mr Remick verified insurance/PCP; he denied pt experiencing SDOH risks; pt has cane; she does not have HH services or home oxygen; IP CM will follow.  Expected Discharge Plan: Home/Self Care Barriers to Discharge: Continued Medical Work up   Patient Goals and CMS Choice Patient states their goals for this hospitalization and ongoing recovery are:: patient's husband said she will return home          Expected Discharge Plan and Services   Discharge Planning Services: CM Consult   Living arrangements for the past 2 months: Single Family Home                 DME Arranged: N/A DME Agency: NA       HH Arranged: NA HH Agency: NA        Prior Living Arrangements/Services Living arrangements for the past 2 months: Single Family Home Lives with:: Spouse Patient language and need for interpreter reviewed:: Yes Do you feel safe going back to the place where you live?: Yes      Need for Family Participation in Patient Care: Yes (Comment) Care giver support system in place?: Yes (comment) Current home services: DME (cane) Criminal Activity/Legal Involvement Pertinent to Current Situation/Hospitalization: No - Comment as needed  Activities of Daily Living   ADL Screening (condition at time of admission) Independently performs ADLs?: Yes (appropriate for developmental age) Is the patient deaf or have difficulty hearing?: No Does the patient have difficulty seeing, even when wearing glasses/contacts?: No Does the patient have  difficulty concentrating, remembering, or making decisions?: No  Permission Sought/Granted Permission sought to share information with : Case Manager Permission granted to share information with : Yes, Verbal Permission Granted  Share Information with NAME: Case Manager     Permission granted to share info w Relationship: Telicia Hodgkiss (spouse) [phone]     Emotional Assessment Appearance:: Appears stated age Attitude/Demeanor/Rapport: Unable to Assess Affect (typically observed): Unable to Assess Orientation: :  (unable to assess) Alcohol  / Substance Use: Not Applicable Psych Involvement: No (comment)  Admission diagnosis:  UTI (urinary tract infection) [N39.0] Pyelonephritis [N12] Sepsis, due to unspecified organism, unspecified whether acute organ dysfunction present 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 [place] [address] Phone: [phone] Fax: [phone]  [facility] - [facility] [address] [address] Phone: [phone] Fax: [phone]     Social Drivers of Health (SDOH) Social History: SDOH Screenings   Food Insecurity: No Food Insecurity  (07/13/2024)  Housing: Low Risk  (07/13/2024)  Transportation Needs: No Transportation Needs (07/13/2024)  Utilities: Not At Risk (07/13/2024)  Tobacco Use: Low Risk  (07/12/2024)   SDOH Interventions: Food Insecurity Interventions: Intervention Not Indicated, Inpatient TOC Housing Interventions: Intervention Not Indicated, Inpatient TOC Transportation Interventions: Intervention Not Indicated, Inpatient TOC Utilities Interventions: Intervention Not Indicated, Inpatient TOC   Readmission Risk Interventions     No data to display

## 2024-07-13 NOTE — ED Notes (Signed)
 Report called to 5e RN Brittany.  [facility] is departing our facility at this time.

## 2024-07-13 NOTE — Plan of Care (Signed)
  Problem: Education: Goal: Knowledge of General Education information will improve Description: Including pain rating scale, medication(s)/side effects and non-pharmacologic comfort measures Outcome: Progressing   Problem: Health Behavior/Discharge Planning: Goal: Ability to manage health-related needs will improve Outcome: Progressing   Problem: Clinical Measurements: Goal: Will remain free from infection Outcome: Progressing Goal: Diagnostic test results will improve Outcome: Progressing   Problem: Nutrition: Goal: Adequate nutrition will be maintained Outcome: Progressing   Problem: Elimination: Goal: Will not experience complications related to bowel motility Outcome: Progressing Goal: Will not experience complications related to urinary retention Outcome: Progressing   Problem: Safety: Goal: Ability to remain free from injury will improve Outcome: Progressing

## 2024-07-13 NOTE — H&P (Signed)
 History and Physical    [name] FMW:[id number] DOB: Aug 28, 1965 DOA: 07/12/2024  PCP: Catalina Bare, MD   Chief Complaint:  back pain, myalgia  HPI: [name] is a 58 y.o. female with medical history significant of ITP, GERD, opioid-induced constipation, sickle cell trait who presents emergency department with chills and rigors as well as myalgias.  Patient's had back pain as well as subjective fever at home so she presented to the ER where she was found to be afebrile and hemodynamically stable will.  Labs were obtained on presentation which showed WBC 22.4, hemoglobin 11.  IV fluids were given and CT scan was obtained which showed heavy stool burden, nonobstructive left nephrolithiasis.  Urinalysis equivocal for infection.  Lactic acid 2.0.  Creatinine 1.23, glucose 250.  Platelets 95. On admission she was endorsing arm pain as well as back pain. She denies dyruria.    Review of Systems: Review of Systems  Constitutional:  Positive for chills.  HENT: Negative.    Eyes: Negative.   Respiratory: Negative.    Cardiovascular: Negative.   Gastrointestinal: Negative.   Genitourinary: Negative.  Negative for dysuria, frequency and urgency.  Musculoskeletal:  Positive for back pain and myalgias.  Skin: Negative.   Neurological: Negative.   Endo/Heme/Allergies: Negative.   Psychiatric/Behavioral: Negative.       As per HPI otherwise 10 point review of systems negative.   Allergies  Allergen Reactions   Ampicillin  Hives and Itching    Has patient had a PCN reaction causing immediate rash, facial/tongue/throat swelling, SOB or lightheadedness with hypotension: Y Has patient had a PCN reaction causing severe rash involving mucus membranes or skin necrosis: Y Has patient had a PCN reaction that required hospitalization: Y Has patient had a PCN reaction occurring within the last 10 years: Y If all of the above answers are NO, then may proceed with Cephalosporin use.      Past Medical History:  Diagnosis Date   Arthritis 2008   Lumbar Deg Disc Disease   Chest pain    Diabetes mellitus without complication (HCC)    GERD (gastroesophageal reflux disease)    HTN (hypertension)    Hyperlipidemia associated with type 2 diabetes mellitus (HCC)    Low vitamin D level    Migraine    Sickle cell trait    Thymic cyst     Past Surgical History:  Procedure Laterality Date   CARDIAC CATHETERIZATION     COLONOSCOPY     IR BONE MARROW BIOPSY & ASPIRATION  06/12/2024     reports that she has never smoked. She has never used smokeless tobacco. She reports that she does not drink alcohol  and does not use drugs.  Family History  Problem Relation Age of Onset   Diabetes Mother    Hypertension Mother    Colon cancer Neg Hx     Prior to Admission medications   Medication Sig Start Date End Date Taking? Authorizing Provider  amLODipine  (NORVASC ) 10 MG tablet Take 1 tablet (10 mg total) by mouth daily. 02/26/18   Jillian Buttery, MD  Calcium  Carbonate-Vitamin D (CALCIUM -VITAMIN D) 500-200 MG-UNIT per tablet Take 1 tablet by mouth 2 (two) times daily with a meal.      [provider]  cetirizine (ZYRTEC) 10 MG tablet Take 10 mg by mouth daily. 12/29/23   [provider]  copper  tablet Take 2 mg by mouth daily.    [provider]  diclofenac  sodium (VOLTAREN ) 1 % GEL APPLY 4GM TO AFFECTED  JOINTS EVERY 6 HOURS. 01/20/19   [provider]  folic acid  (FOLVITE ) 1 MG tablet Take 1 tablet (1 mg total) by mouth daily. 08/10/19   Franchot Lauraine HERO, NP  glimepiride (AMARYL) 2 MG tablet Take 2 mg by mouth daily. 02/28/18   [provider]  metFORMIN  (GLUCOPHAGE ) 500 MG tablet Take 500 mg by mouth daily.  11/22/17   [provider]  methocarbamol  (ROBAXIN ) 500 MG tablet Take 1 tablet (500 mg total) by mouth 2 (two) times daily. 05/18/24   Emil Share, DO  metoprolol  succinate (TOPROL -XL) 25 MG 24 hr tablet Take 12.5 mg by mouth  daily. 11/22/17   [provider]  naproxen (NAPROSYN) 500 MG tablet Take 500 mg by mouth every 8 (eight) hours. 04/24/24   [provider]  NARCAN  4 MG/0.1ML LIQD nasal spray kit  09/10/18   [provider]  oxyCODONE -acetaminophen  (PERCOCET) 10-325 MG tablet Take 1 tablet by mouth 3 (three) times daily. 01/12/18   [provider]  predniSONE  (DELTASONE ) 20 MG tablet Take 2 tablets (40 mg total) by mouth daily with breakfast. 07/10/24   Lonn Hicks, MD  pregabalin  (LYRICA ) 150 MG capsule Take 150 mg by mouth every 12 (twelve) hours. 04/11/24   [provider]  PREMARIN vaginal cream INSERT 0.5 GRAMS VAGINALLY TWICE A WEEK FOR 4 WEEKS THEN AS NEEDED 03/13/19   [provider]  RELISTOR  150 MG TABS Take 3 tablets by mouth daily. 03/26/24   [provider]  rosuvastatin  (CRESTOR ) 20 MG tablet Take 20 mg by mouth daily. 11/22/17   [provider]  sucralfate (CARAFATE) 1 g tablet 1 tab(s) orally 4 times a day (before meals and at bedtime) WHEN NEEDED    [provider]  Vitamin D, Ergocalciferol, (DRISDOL) 50000 units CAPS capsule TAKE 1 CAPSULE BY MOUTH TWICE A WEEK 02/09/18   [provider]    Physical Exam: Vitals:   07/13/24 0000 07/13/24 0100 07/13/24 0103 07/13/24 0210  BP: (!) 146/90 (!) 176/85  130/71  Pulse: 66 81  82  Resp:    19  Temp:   98.8 F (37.1 C) 98.5 F (36.9 C)  TempSrc:   Oral   SpO2: 91% 93%    Weight:      Height:       Physical Exam Constitutional:      Appearance: She is normal weight.  HENT:     Head: Normocephalic.     Nose: Nose normal.     Mouth/Throat:     Mouth: Mucous membranes are moist.     Pharynx: Oropharynx is clear.  Eyes:     Conjunctiva/sclera: Conjunctivae normal.     Pupils: Pupils are equal, round, and reactive to light.  Cardiovascular:     Rate and Rhythm: Normal rate and regular rhythm.     Pulses: Normal pulses.     Heart sounds: Normal heart sounds.   Pulmonary:     Effort: Pulmonary effort is normal.     Breath sounds: Normal breath sounds.  Abdominal:     General: Abdomen is flat. Bowel sounds are normal.  Musculoskeletal:        General: Normal range of motion.     Cervical back: Normal range of motion.  Skin:    General: Skin is warm.     Capillary Refill: Capillary refill takes less than 2 seconds.  Neurological:     General: No focal deficit present.     Mental Status: She is  alert.  Psychiatric:        Mood and Affect: Mood normal.        Behavior: Behavior normal.       Labs on Admission: I have personally reviewed the patients's labs and imaging studies.  Assessment/Plan Principal Problem:   UTI (urinary tract infection) Active Problems:   Pyelonephritis   # Back and arm pain concerning for possible UTI # Sepsis, unclear etiology - Patient found to have lactic acidosis - Leukocytosis - Urinalysis not particularly concerning for infection  Plan: Continue ceftriaxone  Continue IV fluids Trend lactic acid  # Itp-hold steroids in setting of possible infection  # Hypertension-continue amlodipine , metoprolol   # Opioid-induced constipation-continue methylnaltrexone   # Hyperglycemia-placed on sliding scale  #HLD- continue crestor    Admission status: Observation Med-Surg  Certification: The appropriate patient status for this patient is OBSERVATION. Observation status is judged to be reasonable and necessary in order to provide the required intensity of service to ensure the patient's safety. The patient's presenting symptoms, physical exam findings, and initial radiographic and laboratory data in the context of their medical condition is felt to place them at decreased risk for further clinical deterioration. Furthermore, it is anticipated that the patient will be medically stable for discharge from the hospital within 2 midnights of admission.     Lamar Dess MD [facility] If 7PM-7AM,  please contact night-coverage [email or web address]  07/13/2024, 3:21 AM

## 2024-07-13 NOTE — Plan of Care (Signed)
   Problem: Education: Goal: Knowledge of General Education information will improve Description: Including pain rating scale, medication(s)/side effects and non-pharmacologic comfort measures Outcome: Progressing   Problem: Clinical Measurements: Goal: Will remain free from infection Outcome: Progressing   Problem: Nutrition: Goal: Adequate nutrition will be maintained Outcome: Progressing   Problem: Coping: Goal: Level of anxiety will decrease Outcome: Progressing   Problem: Pain Managment: Goal: General experience of comfort will improve and/or be controlled Outcome: Progressing

## 2024-07-14 ENCOUNTER — Inpatient Hospital Stay (HOSPITAL_COMMUNITY)

## 2024-07-14 DIAGNOSIS — K5900 Constipation, unspecified: Secondary | ICD-10-CM | POA: Diagnosis not present

## 2024-07-14 DIAGNOSIS — N390 Urinary tract infection, site not specified: Secondary | ICD-10-CM

## 2024-07-14 DIAGNOSIS — I878 Other specified disorders of veins: Secondary | ICD-10-CM | POA: Diagnosis not present

## 2024-07-14 DIAGNOSIS — B9620 Unspecified Escherichia coli [E. coli] as the cause of diseases classified elsewhere: Secondary | ICD-10-CM

## 2024-07-14 LAB — GLUCOSE, CAPILLARY
Glucose-Capillary: 273 mg/dL — ABNORMAL HIGH (ref 70–99)
Glucose-Capillary: 287 mg/dL — ABNORMAL HIGH (ref 70–99)
Glucose-Capillary: 206 mg/dL — ABNORMAL HIGH (ref 70–99)
Glucose-Capillary: 169 mg/dL — ABNORMAL HIGH (ref 70–99)
Glucose-Capillary: 315 mg/dL — ABNORMAL HIGH (ref 70–99)

## 2024-07-14 MED ORDER — INSULIN ASPART 100 UNIT/ML IJ SOLN
8.0000 [IU] | Freq: Once | INTRAMUSCULAR | Status: AC
  Administered 2024-07-14: 8 [IU] via SUBCUTANEOUS
  Filled 2024-07-14: qty 8

## 2024-07-14 NOTE — Plan of Care (Signed)

## 2024-07-14 NOTE — Plan of Care (Signed)
  Problem: Clinical Measurements: Goal: Will remain free from infection Outcome: Progressing   Problem: Health Behavior/Discharge Planning: Goal: Ability to manage health-related needs will improve Outcome: Progressing   Problem: Nutrition: Goal: Adequate nutrition will be maintained Outcome: Progressing   Problem: Pain Managment: Goal: General experience of comfort will improve and/or be controlled Outcome: Progressing

## 2024-07-14 NOTE — Progress Notes (Signed)
 Brief progress note: -Patient was admitted earlier today.  As per H&P done earlier today: [name] is a 58 y.o. female with medical history significant of ITP, GERD, opioid-induced constipation, sickle cell trait who presents emergency department with chills and rigors as well as myalgias.  Patient's had back pain as well as subjective fever at home so she presented to the ER where she was found to be afebrile and hemodynamically stable will.  Labs were obtained on presentation which showed WBC 22.4, hemoglobin 11.  IV fluids were given and CT scan was obtained which showed heavy stool burden, nonobstructive left nephrolithiasis.  Urinalysis equivocal for infection.  Lactic acid 2.0.  Creatinine 1.23, glucose 250.  Platelets 95. On admission she was endorsing arm pain as well as back pain. She denies dyruria.   07/13/2024: Seen alongside patient's husband and nurse.  Patient reports significant pain involving both upper extremities.  Denies neck pain.  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 - Will repeat cervical MRI. -Patient follows up with pain physician, and patient is on several mind altering medications.  Patient has seen pain physician for several years. - Urine and blood cultures are still pending. - Continue antibiotics. - Optimize pain control.  Further management will depend on hospital course.

## 2024-07-14 NOTE — Progress Notes (Signed)
 PROGRESS NOTE    [name]  FMW:[id number] DOB: 01/04/1966 DOA: 07/12/2024 PCP: Catalina Bare, MD  Outpatient Specialists:     Brief Narrative:  As per H&P done on admission: [name] is a 58 y.o. female with medical history significant of ITP, GERD, opioid-induced constipation, sickle cell trait who presents emergency department with chills and rigors as well as myalgias.  Patient's had back pain as well as subjective fever at home so she presented to the ER where she was found to be afebrile and hemodynamically stable will.  Labs were obtained on presentation which showed WBC 22.4, hemoglobin 11.  IV fluids were given and CT scan was obtained which showed heavy stool burden, nonobstructive left nephrolithiasis.  Urinalysis equivocal for infection.  Lactic acid 2.0.  Creatinine 1.23, glucose 250.  Platelets 95. On admission she was endorsing arm pain as well as back pain. She denies dyruria.   07/14/2024: Patient reported pain involving both upper extremities.  Denies neck pain.  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  Repeat cervical MRI revealed small degenerative anterolisthesis at C3-4, new compared to prior study and moderate bilateral foraminal stenosis at C6-7, slightly progressed compared to prior study.  Patient is known to the pain physician.  Urine culture is growing E. coli.  Follow final cultures.   Assessment & Plan:   Principal Problem:   UTI (urinary tract infection) Active Problems:   Pyelonephritis   UTI: - Secondary to E. coli. - Follow final cultures. - Continue Rocephin .  Bilateral upper extremity pain: - Cervical MRI could not explain pain. - Patient is known to the pain clinic. - Patient has been on opiates and other medications. - Optimize pain control.  Opioid-induced constipation/stool overload: - Abdomen x-ray. - Continue  methylnaltrexone  - Low threshold to proceed with soft solid enema.  DVT prophylaxis: Subcu Lovenox  Code Status: Full code Family Communication: Husband Disposition Plan: Inpatient   Consultants:  None  Procedures:  None  Antimicrobials:  IV ceftriaxone    Subjective: No new complaints  Objective: Vitals:   07/14/24 0150 07/14/24 0602 07/14/24 0941 07/14/24 1203  BP: (!) 149/82 (!) 160/97 (!) 160/97 (!) 144/84  Pulse: 66 72 72 63  Resp: 20 20  20   Temp: 98.6 F (37 C) 98.5 F (36.9 C)  98.6 F (37 C)  TempSrc: Oral Oral  Oral  SpO2: 97% 100%    Weight:      Height:        Intake/Output Summary (Last 24 hours) at 07/14/2024 1418 Last data filed at 07/14/2024 1200 Gross per 24 hour  Intake 1973.22 ml  Output 1 ml  Net 1972.22 ml   Filed Weights   07/12/24 1825  Weight: 82.7 kg    Examination:  General exam: Appears calm and comfortable. Respiratory system: Clear to auscultation. Respiratory effort normal. Cardiovascular system: S1 & S2 heard Gastrointestinal system: Abdomen is obese, soft and nontender.   Central nervous system: Alert and oriented.  Data Reviewed: I have personally reviewed following labs and imaging studies  CBC: Recent Labs  Lab 07/10/24 1134 07/12/24 2119 07/13/24 0547  WBC 8.7 22.4* 19.8*  NEUTROABS 5.5 16.8*  --   HGB 11.7* 11.0* 10.4*  HCT 33.2* 31.6* 30.5*  MCV 83.6 85.2 87.9  PLT 115* 95* 80*   Basic Metabolic Panel: Recent Labs  Lab 07/12/24 2119 07/13/24 0547  NA 135 139  K 4.5 4.1  CL 98 105  CO2 26 25  GLUCOSE 250* 192*  BUN 19 16  CREATININE 1.23* 1.21*  CALCIUM  9.6 9.4   GFR: Estimated Creatinine Clearance: 52.7 mL/min (A) (by C-G formula based on SCr of 1.21 mg/dL (H)). Liver Function Tests: Recent Labs  Lab 07/12/24 2119  AST 22  ALT 27  ALKPHOS 72  BILITOT 0.6  PROT 7.2  ALBUMIN 4.0   No results for input(s): LIPASE, AMYLASE in the last 168 hours. No results for input(s): AMMONIA  in the last 168 hours. Coagulation Profile: Recent Labs  Lab 07/13/24 0547  INR 1.3*   Cardiac Enzymes: No results for input(s): CKTOTAL, CKMB, CKMBINDEX, TROPONINI in the last 168 hours. BNP (last 3 results) No results for input(s): PROBNP in the last 8760 hours. HbA1C: No results for input(s): HGBA1C in the last 72 hours. CBG: Recent Labs  Lab 07/13/24 2156 07/13/24 2330 07/14/24 0506 07/14/24 0739 07/14/24 1138  GLUCAP 401* 374* 273* 287* 206*   Lipid Profile: No results for input(s): CHOL, HDL, LDLCALC, TRIG, CHOLHDL, LDLDIRECT in the last 72 hours. Thyroid  Function Tests: No results for input(s): TSH, T4TOTAL, FREET4, T3FREE, THYROIDAB in the last 72 hours. Anemia Panel: No results for input(s): VITAMINB12, FOLATE, FERRITIN, TIBC, IRON, RETICCTPCT in the last 72 hours. Urine analysis:    Component Value Date/Time   COLORURINE YELLOW 07/12/2024 2119   APPEARANCEUR HAZY (A) 07/12/2024 2119   LABSPEC 1.015 07/12/2024 2119   PHURINE 5.5 07/12/2024 2119   GLUCOSEU NEGATIVE 07/12/2024 2119   HGBUR SMALL (A) 07/12/2024 2119   BILIRUBINUR NEGATIVE 07/12/2024 2119   KETONESUR NEGATIVE 07/12/2024 2119   PROTEINUR NEGATIVE 07/12/2024 2119   UROBILINOGEN 0.2 08/01/2013 2111   NITRITE NEGATIVE 07/12/2024 2119   LEUKOCYTESUR SMALL (A) 07/12/2024 2119   Sepsis Labs: @LABRCNTIP (procalcitonin:4,lacticidven:4)  ) Recent Results (from the past 240 hours)  Resp panel by RT-PCR (RSV, Flu A&B, Covid) Anterior Nasal Swab     Status: None   Collection Time: 07/12/24  8:53 PM   Specimen: Anterior Nasal Swab  Result Value Ref Range Status   SARS Coronavirus 2 by RT PCR NEGATIVE NEGATIVE Final    Comment: (NOTE) SARS-CoV-2 target nucleic acids are NOT DETECTED.  The SARS-CoV-2 RNA is generally detectable in upper respiratory specimens during the acute phase of infection. The lowest concentration of SARS-CoV-2 viral copies this assay  can detect is 138 copies/mL. A negative result does not preclude SARS-Cov-2 infection and should not be used as the sole basis for treatment or other patient management decisions. A negative result may occur with  improper specimen collection/handling, submission of specimen other than nasopharyngeal swab, presence of viral mutation(s) within the areas targeted by this assay, and inadequate number of viral copies(<138 copies/mL). A negative result must be combined with clinical observations, patient history, and epidemiological information. The expected result is Negative.  Fact Sheet for Patients:  bloggercourse.com  Fact Sheet for Healthcare Providers:  seriousbroker.it  This test is no t yet approved or cleared by the [place]  FDA and  has been authorized for detection and/or diagnosis of SARS-CoV-2 by FDA under an Emergency Use Authorization (EUA). This EUA will remain  in effect (meaning this test can be used) for the duration of the COVID-19 declaration under Section 564(b)(1) of the Act, 21 U.S.C.section 360bbb-3(b)(1), unless the authorization is terminated  or revoked sooner.       Influenza A by PCR NEGATIVE NEGATIVE Final   Influenza B by PCR NEGATIVE NEGATIVE Final    Comment: (NOTE) The  Xpert Xpress SARS-CoV-2/FLU/RSV plus assay is intended as an aid in the diagnosis of influenza from Nasopharyngeal swab specimens and should not be used as a sole basis for treatment. Nasal washings and aspirates are unacceptable for Xpert Xpress SARS-CoV-2/FLU/RSV testing.  Fact Sheet for Patients: bloggercourse.com  Fact Sheet for Healthcare Providers: seriousbroker.it  This test is not yet approved or cleared by the [place]  FDA and has been authorized for detection and/or diagnosis of SARS-CoV-2 by FDA under an Emergency Use Authorization (EUA). This EUA will  remain in effect (meaning this test can be used) for the duration of the COVID-19 declaration under Section 564(b)(1) of the Act, 21 U.S.C. section 360bbb-3(b)(1), unless the authorization is terminated or revoked.     Resp Syncytial Virus by PCR NEGATIVE NEGATIVE Final    Comment: (NOTE) Fact Sheet for Patients: bloggercourse.com  Fact Sheet for Healthcare Providers: seriousbroker.it  This test is not yet approved or cleared by the [place]  FDA and has been authorized for detection and/or diagnosis of SARS-CoV-2 by FDA under an Emergency Use Authorization (EUA). This EUA will remain in effect (meaning this test can be used) for the duration of the COVID-19 declaration under Section 564(b)(1) of the Act, 21 U.S.C. section 360bbb-3(b)(1), unless the authorization is terminated or revoked.  Performed at [facility], [address] [place], [place] [address]   Urine Culture     Status: Abnormal (Preliminary result)   Collection Time: 07/12/24  9:19 PM   Specimen: Urine, Random  Result Value Ref Range Status   Specimen Description   Final    URINE, RANDOM Performed at [facility], [address] [place], [place] [address]    Special Requests   Final    NONE Reflexed from [id number] Performed at [facility], [address] [place], [place] [address]    Culture (A)  Final    >=100,000 COLONIES/mL ESCHERICHIA COLI SUSCEPTIBILITIES TO FOLLOW Performed at [facility], [address], [place], [place] [address]    Report Status PENDING  Incomplete  Blood culture (routine x 2)     Status: None (Preliminary result)   Collection Time: 07/12/24 11:35 PM   Specimen: BLOOD  Result Value Ref Range Status   Specimen Description   Final    BLOOD RIGHT ANTECUBITAL Performed at [facility], [address] [place], [place] [address]    Special Requests   Final    BOTTLES DRAWN  AEROBIC AND ANAEROBIC Blood Culture adequate volume Performed at [facility], [address] [place], [place] [address]    Culture   Final    NO GROWTH < 24 HOURS Performed at [facility], [address], [place], [place] [address]    Report Status PENDING  Incomplete  Blood culture (routine x 2)     Status: None (Preliminary result)   Collection Time: 07/12/24 11:35 PM   Specimen: BLOOD  Result Value Ref Range Status   Specimen Description   Final    BLOOD LEFT ANTECUBITAL Performed at [facility], [address] [place], [place] [address]    Special Requests   Final    BOTTLES DRAWN AEROBIC AND ANAEROBIC Blood Culture results may not be optimal due to an inadequate volume of blood received in culture bottles Performed at [facility], [address], [place], [place] [address]    Culture  Final    NO GROWTH < 24 HOURS Performed at [facility], [address], [place], [place] [address]    Report Status PENDING  Incomplete         Radiology Studies: DG Abd 2 Views Result Date: 07/14/2024 EXAM: 2 VIEW XRAY OF THE ABDOMEN 07/14/2024 08:58:00 AM COMPARISON: CT abdomen and pelvis 07/12/2024. CLINICAL HISTORY: 58 year old female with constipation. FINDINGS: BOWEL: Nonobstructive bowel gas pattern. Moderate colonic stool burden, abundant retained stool in the colon not significantly changed from the scout view of the recent CT. SOFT TISSUES: No opaque urinary calculi. Calcified fibroid overlying the left pelvis. Stable abdominal and pelvic visceral contours, pelvic phleboliths and uterine fibroid calcifications. BONES: No acute osseous abnormality. IMPRESSION: 1. Nonobstructive bowel gas pattern with abundant colonic stool not changed from recent CT abdomen and pelvis . 2. No free intraperitoneal air. Electronically signed by: Helayne Hurst MD 07/14/2024 09:10 AM EST RP Workstation: [id number]   MR CERVICAL SPINE W WO CONTRAST Result  Date: 07/13/2024 EXAM: MRI CERVICAL SPINE WITH AND WITHOUT CONTRAST 07/13/2024 05:02:19 PM TECHNIQUE: Multiplanar multisequence MRI of the cervical spine was performed without and with the administration of intravenous contrast. CONTRAST: 8 mL of GADOBUTROL  1 mmol/mL. COMPARISON: MR Cervical Spine without contrast 04/07/2013 and CT of the cervical spine 05/18/2024. CLINICAL HISTORY: Cervical radiculopathy, no red flags and neck pain with bilateral arm pain for 1 day. FINDINGS: BONES AND ALIGNMENT: Straightening of the normal cervical lordosis is present. Small degenerative anterolisthesis at C3-C4 is new. Chronic endplate degenerative change and uncovertebral spurring is present at C6-C7. Normal vertebral body heights. Marrow signal is unremarkable. No abnormal enhancement. SPINAL CORD: Normal spinal cord size. Normal spinal cord signal. SOFT TISSUES: No paraspinal mass. C2-C3: No significant disc herniation. No spinal canal stenosis or neural foraminal narrowing. C3-C4: Small degenerative anterolisthesis at C3-C4 is new. No significant disc herniation. No spinal canal stenosis or neural foraminal narrowing. C4-C5: No significant disc herniation. No spinal canal stenosis or neural foraminal narrowing. C5-C6: No significant disc herniation. No spinal canal stenosis or neural foraminal narrowing. C6-C7: Chronic endplate degenerative change and uncovertebral spurring is present at C6-C7. Moderate foraminal stenosis bilaterally is slightly progressed. No significant disc herniation. No spinal canal stenosis. C7-T1: No significant disc herniation. No spinal canal stenosis or neural foraminal narrowing. IMPRESSION: 1. Small degenerative anterolisthesis at C3-4, new compared to prior study. 2. Moderate bilateral foraminal stenosis at C6-7, slightly progressed compared to prior study. Electronically signed by: Lonni Necessary MD 07/13/2024 05:47 PM EST RP Workstation: [id number]   CT ABDOMEN PELVIS W CONTRAST Result  Date: 07/12/2024 EXAM: CT ABDOMEN AND PELVIS WITH CONTRAST 07/12/2024 11:20:12 PM TECHNIQUE: CT of the abdomen and pelvis was performed with the administration of 100 mL of iohexol  (OMNIPAQUE ) 300 MG/ML solution. Multiplanar reformatted images are provided for review. Automated exposure control, iterative reconstruction, and/or weight-based adjustment of the mA/kV was utilized to reduce the radiation dose to as low as reasonably achievable. COMPARISON: CT abdomen and pelvis 05/22/2024. pet ct 10/10/225 CLINICAL HISTORY: Sepsis. FINDINGS: LOWER CHEST: Mitral annular calcification. Moderate atherosclerotic plaque. LIVER: The liver is unremarkable. GALLBLADDER AND BILE DUCTS: Gallbladder is unremarkable. No biliary ductal dilatation. SPLEEN: No acute abnormality. PANCREAS: No acute abnormality. ADRENAL GLANDS: No acute abnormality. KIDNEYS, URETERS AND BLADDER: Bilateral renal cortical scarring. 1 to 2 mm left nephrolithiasis. No right nephrolithiasis. No ureterolithiasis bilaterally. No hydroureteronephrosis. No perinephric or periureteral stranding. No filling defects of the partially visualized collecting systems on delayed imaging. Urinary bladder is unremarkable.  GI AND BOWEL: Stomach demonstrates no acute abnormality. No small or large bowel thickening or dilatation. The appendix is unremarkable. Stool throughout the colon. PERITONEUM AND RETROPERITONEUM: No ascites. No free air. VASCULATURE: Aorta is normal in caliber. LYMPH NODES: No lymphadenopathy. REPRODUCTIVE ORGANS: Fundal calcified uterine fibroid. Otherwise, the uterus is unremarkable. No adnexal mass. BONES AND SOFT TISSUES: Stable multilevel severe degenerative changes of the spine. Stable multilevel intervertebral disc space vacuum phenomenon. No acute osseous abnormality. No focal soft tissue abnormality. IMPRESSION: 1. Stool throughout the colon. Correlate for constipation. 2. Nonobstructive 1 to 2 mm left nephrolithiasis. 3. Other, non-acute  and/or normal findings as above. Electronically signed by: Morgane Naveau MD 07/12/2024 11:32 PM EST RP Workstation: [id number]   DG Chest 2 View Result Date: 07/12/2024 CLINICAL DATA:  Chills and shaking. EXAM: CHEST - 2 VIEW COMPARISON:  January 27, 2018 FINDINGS: The cardiac silhouette is mildly enlarged and unchanged in size. Mild linear atelectasis is seen within the mid left lung. No acute infiltrate, pleural effusion or pneumothorax is identified. No acute osseous abnormalities are identified. IMPRESSION: Mild cardiomegaly with mild mid left lung linear atelectasis. Electronically Signed   By: Suzen Dials M.D.   On: 07/12/2024 22:09        Scheduled Meds:  oxyCODONE   10 mg Oral TID   And   acetaminophen   325 mg Oral TID   amLODipine   10 mg Oral Daily   dexamethasone   4 mg Oral Q6H   enoxaparin  (LOVENOX ) injection  40 mg Subcutaneous Q24H   insulin  aspart  0-24 Units Subcutaneous TID WC   Methylnaltrexone  Bromide  3 tablet Oral Daily   metoprolol  succinate  25 mg Oral Daily   pregabalin   150 mg Oral Q12H   rosuvastatin   20 mg Oral Daily   Continuous Infusions:  cefTRIAXone  (ROCEPHIN )  IV 2 g (07/13/24 2158)   lactated ringers  125 mL/hr at 07/14/24 0916     LOS: 1 day    Time spent: 35 minutes    Leatrice Chapel, MD  [facility] Pager #: [phone] 7PM-7AM contact night coverage as above

## 2024-07-15 ENCOUNTER — Inpatient Hospital Stay (HOSPITAL_COMMUNITY)

## 2024-07-15 DIAGNOSIS — B9620 Unspecified Escherichia coli [E. coli] as the cause of diseases classified elsewhere: Secondary | ICD-10-CM | POA: Diagnosis not present

## 2024-07-15 DIAGNOSIS — K5900 Constipation, unspecified: Secondary | ICD-10-CM | POA: Diagnosis not present

## 2024-07-15 DIAGNOSIS — N390 Urinary tract infection, site not specified: Secondary | ICD-10-CM | POA: Diagnosis not present

## 2024-07-15 LAB — GLUCOSE, CAPILLARY
Glucose-Capillary: 159 mg/dL — ABNORMAL HIGH (ref 70–99)
Glucose-Capillary: 229 mg/dL — ABNORMAL HIGH (ref 70–99)
Glucose-Capillary: 233 mg/dL — ABNORMAL HIGH (ref 70–99)
Glucose-Capillary: 174 mg/dL — ABNORMAL HIGH (ref 70–99)
Glucose-Capillary: 288 mg/dL — ABNORMAL HIGH (ref 70–99)

## 2024-07-15 LAB — CBC WITH DIFFERENTIAL/PLATELET
Abs Immature Granulocytes: 0.43 K/uL — ABNORMAL HIGH (ref 0.00–0.07)
Basophils Absolute: 0 K/uL (ref 0.0–0.1)
Basophils Relative: 0 %
Eosinophils Absolute: 0 K/uL (ref 0.0–0.5)
Eosinophils Relative: 0 %
HCT: 29.5 % — ABNORMAL LOW (ref 36.0–46.0)
Hemoglobin: 10.2 g/dL — ABNORMAL LOW (ref 12.0–15.0)
Immature Granulocytes: 3 %
Lymphocytes Relative: 5 %
Lymphs Abs: 0.9 K/uL (ref 0.7–4.0)
MCH: 29.1 pg (ref 26.0–34.0)
MCHC: 34.6 g/dL (ref 30.0–36.0)
MCV: 84 fL (ref 80.0–100.0)
Monocytes Absolute: 0.6 K/uL (ref 0.1–1.0)
Monocytes Relative: 4 %
Neutro Abs: 14.9 K/uL — ABNORMAL HIGH (ref 1.7–7.7)
Neutrophils Relative %: 88 %
Platelets: 81 K/uL — ABNORMAL LOW (ref 150–400)
RBC: 3.51 MIL/uL — ABNORMAL LOW (ref 3.87–5.11)
RDW: 13.9 % (ref 11.5–15.5)
Smear Review: NORMAL
WBC: 16.8 K/uL — ABNORMAL HIGH (ref 4.0–10.5)
nRBC: 0.1 % (ref 0.0–0.2)

## 2024-07-15 LAB — URINE CULTURE: Culture: >=100000 — AB

## 2024-07-15 NOTE — Plan of Care (Signed)

## 2024-07-16 ENCOUNTER — Other Ambulatory Visit (HOSPITAL_COMMUNITY): Payer: Self-pay

## 2024-07-16 DIAGNOSIS — N390 Urinary tract infection, site not specified: Secondary | ICD-10-CM | POA: Diagnosis not present

## 2024-07-16 DIAGNOSIS — B9620 Unspecified Escherichia coli [E. coli] as the cause of diseases classified elsewhere: Secondary | ICD-10-CM | POA: Diagnosis not present

## 2024-07-16 LAB — CBC WITH DIFFERENTIAL/PLATELET
Abs Immature Granulocytes: 0.42 K/uL — ABNORMAL HIGH (ref 0.00–0.07)
Basophils Absolute: 0 K/uL (ref 0.0–0.1)
Basophils Relative: 0 %
Eosinophils Absolute: 0 K/uL (ref 0.0–0.5)
Eosinophils Relative: 0 %
HCT: 28.6 % — ABNORMAL LOW (ref 36.0–46.0)
Hemoglobin: 9.8 g/dL — ABNORMAL LOW (ref 12.0–15.0)
Immature Granulocytes: 3 %
Lymphocytes Relative: 7 %
Lymphs Abs: 0.8 K/uL (ref 0.7–4.0)
MCH: 29 pg (ref 26.0–34.0)
MCHC: 34.3 g/dL (ref 30.0–36.0)
MCV: 84.6 fL (ref 80.0–100.0)
Monocytes Absolute: 0.7 K/uL (ref 0.1–1.0)
Monocytes Relative: 5 %
Neutro Abs: 10.5 K/uL — ABNORMAL HIGH (ref 1.7–7.7)
Neutrophils Relative %: 85 %
Platelets: 75 K/uL — ABNORMAL LOW (ref 150–400)
RBC: 3.38 MIL/uL — ABNORMAL LOW (ref 3.87–5.11)
RDW: 14 % (ref 11.5–15.5)
WBC: 12.4 K/uL — ABNORMAL HIGH (ref 4.0–10.5)
nRBC: 0.2 % (ref 0.0–0.2)

## 2024-07-16 LAB — GLUCOSE, CAPILLARY
Glucose-Capillary: 191 mg/dL — ABNORMAL HIGH (ref 70–99)
Glucose-Capillary: 189 mg/dL — ABNORMAL HIGH (ref 70–99)

## 2024-07-16 MED ORDER — RELISTOR 150 MG PO TABS
3.0000 | ORAL_TABLET | Freq: Every day | ORAL | 1 refills | Status: AC
  Filled 2024-07-16: qty 90, 30d supply, fill #0

## 2024-07-16 MED ORDER — METFORMIN HCL 500 MG PO TABS
500.0000 mg | ORAL_TABLET | Freq: Two times a day (BID) | ORAL | 1 refills | Status: AC
  Filled 2024-07-16: qty 60, 30d supply, fill #0

## 2024-07-16 MED ORDER — CEFADROXIL 500 MG PO CAPS
1000.0000 mg | ORAL_CAPSULE | Freq: Two times a day (BID) | ORAL | 0 refills | Status: AC
  Filled 2024-07-16: qty 16, 4d supply, fill #0

## 2024-07-16 NOTE — Plan of Care (Signed)
  Problem: Clinical Measurements: Goal: Ability to maintain clinical measurements within normal limits will improve Outcome: Progressing   Problem: Activity: Goal: Risk for activity intolerance will decrease Outcome: Progressing   Problem: Pain Managment: Goal: General experience of comfort will improve and/or be controlled Outcome: Progressing

## 2024-07-16 NOTE — Progress Notes (Signed)
 Discharge medications delivered to patient at the bedside.

## 2024-07-16 NOTE — Plan of Care (Signed)

## 2024-07-16 NOTE — Progress Notes (Signed)
 PROGRESS NOTE    [name]  FMW:[id number] DOB: 1965-11-20 DOA: 07/12/2024 PCP: Catalina Bare, MD  Outpatient Specialists:     Brief Narrative:  As per H&P done on admission: [name] is a 57 y.o. female with medical history significant of ITP, GERD, opioid-induced constipation, sickle cell trait who presents emergency department with chills and rigors as well as myalgias.  Patient's had back pain as well as subjective fever at home so she presented to the ER where she was found to be afebrile and hemodynamically stable will.  Labs were obtained on presentation which showed WBC 22.4, hemoglobin 11.  IV fluids were given and CT scan was obtained which showed heavy stool burden, nonobstructive left nephrolithiasis.  Urinalysis equivocal for infection.  Lactic acid 2.0.  Creatinine 1.23, glucose 250.  Platelets 95. On admission she was endorsing arm pain as well as back pain. She denies dyruria.   07/14/2024: Patient reported pain involving both upper extremities.  Denies neck pain.  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  Repeat cervical MRI revealed small degenerative anterolisthesis at C3-4, new compared to prior study and moderate bilateral foraminal stenosis at C6-7, slightly progressed compared to prior study.  Patient is known to the pain physician.  Urine culture is growing E. coli.  Follow final cultures.  07/15/2024: Urine culture grew E. coli.  Will continue IV antibiotics.  Leukocytosis persists, but improving.  Bilateral upper extremity pain has improved significantly, according to the patient.   Assessment & Plan:   Principal Problem:   UTI (urinary tract infection) Active Problems:   Pyelonephritis   UTI: - Secondary to E. coli. - Continue Rocephin . - Leukocytosis is slowly improving. - CBC in the morning.  Bilateral upper extremity pain: - Cervical MRI  could not explain pain. - Patient is known to the pain clinic. - Patient has been on opiates and other medications. - Optimize pain control. 07/15/2024: Pain has improved significantly.  Opioid-induced constipation/stool overload: - Repeat abdomen x-ray. - Continue methylnaltrexone  - Enema was given yesterday.  Low threshold to repeat enema.  DVT prophylaxis: Subcu Lovenox  Code Status: Full code Family Communication: Husband Disposition Plan: Inpatient   Consultants:  None  Procedures:  None  Antimicrobials:  IV ceftriaxone    Subjective: No new complaints  Objective: Vitals:   07/14/24 1947 07/15/24 0458 07/15/24 1209 07/15/24 2013  BP: (!) 144/86 130/68 (!) 160/78 (!) 140/73  Pulse: 63 91 (!) 52 (!) 58  Resp:  15 18 17   Temp: 98.6 F (37 C) 98.1 F (36.7 C) 98.2 F (36.8 C) 97.7 F (36.5 C)  TempSrc: Oral Oral Oral   SpO2:    100%  Weight:      Height:        Intake/Output Summary (Last 24 hours) at 07/16/2024 0433 Last data filed at 07/15/2024 1757 Gross per 24 hour  Intake 720 ml  Output --  Net 720 ml   Filed Weights   07/12/24 1825  Weight: 82.7 kg    Examination:  General exam: Appears calm and comfortable. Respiratory system: Clear to auscultation. Respiratory effort normal. Cardiovascular system: S1 & S2 heard Gastrointestinal system: Abdomen is obese, soft and nontender.   Central nervous system: Alert and oriented.  Data Reviewed: I have personally reviewed following labs and imaging studies  CBC: Recent Labs  Lab 07/10/24 1134 07/12/24 2119 07/13/24 0547 07/15/24 1126  WBC 8.7 22.4* 19.8* 16.8*  NEUTROABS 5.5 16.8*  --  14.9*  HGB 11.7* 11.0* 10.4* 10.2*  HCT 33.2* 31.6* 30.5* 29.5*  MCV 83.6 85.2 87.9 84.0  PLT 115* 95* 80* 81*   Basic Metabolic Panel: Recent Labs  Lab 07/12/24 2119 07/13/24 0547  NA 135 139  K 4.5 4.1  CL 98 105  CO2 26 25  GLUCOSE 250* 192*  BUN 19 16  CREATININE 1.23* 1.21*  CALCIUM  9.6 9.4    GFR: Estimated Creatinine Clearance: 52.7 mL/min (A) (by C-G formula based on SCr of 1.21 mg/dL (H)). Liver Function Tests: Recent Labs  Lab 07/12/24 2119  AST 22  ALT 27  ALKPHOS 72  BILITOT 0.6  PROT 7.2  ALBUMIN 4.0   No results for input(s): LIPASE, AMYLASE in the last 168 hours. No results for input(s): AMMONIA in the last 168 hours. Coagulation Profile: Recent Labs  Lab 07/13/24 0547  INR 1.3*   Cardiac Enzymes: No results for input(s): CKTOTAL, CKMB, CKMBINDEX, TROPONINI in the last 168 hours. BNP (last 3 results) No results for input(s): PROBNP in the last 8760 hours. HbA1C: No results for input(s): HGBA1C in the last 72 hours. CBG: Recent Labs  Lab 07/15/24 0302 07/15/24 0753 07/15/24 1206 07/15/24 1613 07/15/24 2018  GLUCAP 159* 229* 233* 174* 288*   Lipid Profile: No results for input(s): CHOL, HDL, LDLCALC, TRIG, CHOLHDL, LDLDIRECT in the last 72 hours. Thyroid  Function Tests: No results for input(s): TSH, T4TOTAL, FREET4, T3FREE, THYROIDAB in the last 72 hours. Anemia Panel: No results for input(s): VITAMINB12, FOLATE, FERRITIN, TIBC, IRON, RETICCTPCT in the last 72 hours. Urine analysis:    Component Value Date/Time   COLORURINE YELLOW 07/12/2024 2119   APPEARANCEUR HAZY (A) 07/12/2024 2119   LABSPEC 1.015 07/12/2024 2119   PHURINE 5.5 07/12/2024 2119   GLUCOSEU NEGATIVE 07/12/2024 2119   HGBUR SMALL (A) 07/12/2024 2119   BILIRUBINUR NEGATIVE 07/12/2024 2119   KETONESUR NEGATIVE 07/12/2024 2119   PROTEINUR NEGATIVE 07/12/2024 2119   UROBILINOGEN 0.2 08/01/2013 2111   NITRITE NEGATIVE 07/12/2024 2119   LEUKOCYTESUR SMALL (A) 07/12/2024 2119   Sepsis Labs: @LABRCNTIP (procalcitonin:4,lacticidven:4)  ) Recent Results (from the past 240 hours)  Resp panel by RT-PCR (RSV, Flu A&B, Covid) Anterior Nasal Swab     Status: None   Collection Time: 07/12/24  8:53 PM   Specimen: Anterior Nasal  Swab  Result Value Ref Range Status   SARS Coronavirus 2 by RT PCR NEGATIVE NEGATIVE Final    Comment: (NOTE) SARS-CoV-2 target nucleic acids are NOT DETECTED.  The SARS-CoV-2 RNA is generally detectable in upper respiratory specimens during the acute phase of infection. The lowest concentration of SARS-CoV-2 viral copies this assay can detect is 138 copies/mL. A negative result does not preclude SARS-Cov-2 infection and should not be used as the sole basis for treatment or other patient management decisions. A negative result may occur with  improper specimen collection/handling, submission of specimen other than nasopharyngeal swab, presence of viral mutation(s) within the areas targeted by this assay, and inadequate number of viral copies(<138 copies/mL). A negative result must be combined with clinical observations, patient history, and epidemiological information. The expected result is Negative.  Fact Sheet for Patients:  bloggercourse.com  Fact Sheet for Healthcare Providers:  seriousbroker.it  This test is no t yet approved or cleared by the [place]  FDA and  has been authorized for detection and/or diagnosis of SARS-CoV-2 by FDA under an Emergency Use Authorization (EUA). This EUA will remain  in effect (meaning this test can be used) for the  duration of the COVID-19 declaration under Section 564(b)(1) of the Act, 21 U.S.C.section 360bbb-3(b)(1), unless the authorization is terminated  or revoked sooner.       Influenza A by PCR NEGATIVE NEGATIVE Final   Influenza B by PCR NEGATIVE NEGATIVE Final    Comment: (NOTE) The Xpert Xpress SARS-CoV-2/FLU/RSV plus assay is intended as an aid in the diagnosis of influenza from Nasopharyngeal swab specimens and should not be used as a sole basis for treatment. Nasal washings and aspirates are unacceptable for Xpert Xpress SARS-CoV-2/FLU/RSV testing.  Fact Sheet for  Patients: bloggercourse.com  Fact Sheet for Healthcare Providers: seriousbroker.it  This test is not yet approved or cleared by the [place]  FDA and has been authorized for detection and/or diagnosis of SARS-CoV-2 by FDA under an Emergency Use Authorization (EUA). This EUA will remain in effect (meaning this test can be used) for the duration of the COVID-19 declaration under Section 564(b)(1) of the Act, 21 U.S.C. section 360bbb-3(b)(1), unless the authorization is terminated or revoked.     Resp Syncytial Virus by PCR NEGATIVE NEGATIVE Final    Comment: (NOTE) Fact Sheet for Patients: bloggercourse.com  Fact Sheet for Healthcare Providers: seriousbroker.it  This test is not yet approved or cleared by the [place]  FDA and has been authorized for detection and/or diagnosis of SARS-CoV-2 by FDA under an Emergency Use Authorization (EUA). This EUA will remain in effect (meaning this test can be used) for the duration of the COVID-19 declaration under Section 564(b)(1) of the Act, 21 U.S.C. section 360bbb-3(b)(1), unless the authorization is terminated or revoked.  Performed at [facility], [address], [place], [place] [address]   Urine Culture     Status: Abnormal   Collection Time: 07/12/24  9:19 PM   Specimen: Urine, Random  Result Value Ref Range Status   Specimen Description   Final    URINE, RANDOM Performed at [facility], [address] [place], [place] [address]    Special Requests   Final    NONE Reflexed from [id number] Performed at [facility], [address] [place], [place] [address]    Culture (A)  Final    >=100,000 COLONIES/mL ESCHERICHIA COLI WITHIN MIXED ORGANISMS Performed at [facility], [address], [place], [place] [address]    Report Status 07/15/2024 FINAL  Final   Organism ID, Bacteria  ESCHERICHIA COLI (A)  Final      Susceptibility   Escherichia coli - MIC*    AMPICILLIN  >=32 RESISTANT Resistant     CEFAZOLIN (URINE) Value in next row Sensitive      2 SENSITIVEThis is a modified FDA-approved test that has been validated and its performance characteristics determined by the reporting laboratory.  This laboratory is certified under the Clinical Laboratory Improvement Amendments CLIA as qualified to perform high complexity clinical laboratory testing.    CEFEPIME Value in next row Sensitive      2 SENSITIVEThis is a modified FDA-approved test that has been validated and its performance characteristics determined by the reporting laboratory.  This laboratory is certified under the Clinical Laboratory Improvement Amendments CLIA as qualified to perform high complexity clinical laboratory testing.    ERTAPENEM Value in next row Sensitive      2 SENSITIVEThis is a modified FDA-approved test that has been validated and its performance characteristics determined by the reporting laboratory.  This laboratory is certified under the Clinical Laboratory Improvement Amendments CLIA as qualified to  perform high complexity clinical laboratory testing.    CEFTRIAXONE  Value in next row Sensitive      2 SENSITIVEThis is a modified FDA-approved test that has been validated and its performance characteristics determined by the reporting laboratory.  This laboratory is certified under the Clinical Laboratory Improvement Amendments CLIA as qualified to perform high complexity clinical laboratory testing.    CIPROFLOXACIN Value in next row Intermediate      2 SENSITIVEThis is a modified FDA-approved test that has been validated and its performance characteristics determined by the reporting laboratory.  This laboratory is certified under the Clinical Laboratory Improvement Amendments CLIA as qualified to perform high complexity clinical laboratory testing.    GENTAMICIN Value in next row Sensitive      2  SENSITIVEThis is a modified FDA-approved test that has been validated and its performance characteristics determined by the reporting laboratory.  This laboratory is certified under the Clinical Laboratory Improvement Amendments CLIA as qualified to perform high complexity clinical laboratory testing.    NITROFURANTOIN Value in next row Sensitive      2 SENSITIVEThis is a modified FDA-approved test that has been validated and its performance characteristics determined by the reporting laboratory.  This laboratory is certified under the Clinical Laboratory Improvement Amendments CLIA as qualified to perform high complexity clinical laboratory testing.    TRIMETH/SULFA Value in next row Resistant      2 SENSITIVEThis is a modified FDA-approved test that has been validated and its performance characteristics determined by the reporting laboratory.  This laboratory is certified under the Clinical Laboratory Improvement Amendments CLIA as qualified to perform high complexity clinical laboratory testing.    AMPICILLIN /SULBACTAM Value in next row Intermediate      2 SENSITIVEThis is a modified FDA-approved test that has been validated and its performance characteristics determined by the reporting laboratory.  This laboratory is certified under the Clinical Laboratory Improvement Amendments CLIA as qualified to perform high complexity clinical laboratory testing.    PIP/TAZO Value in next row Sensitive      <=4 SENSITIVEThis is a modified FDA-approved test that has been validated and its performance characteristics determined by the reporting laboratory.  This laboratory is certified under the Clinical Laboratory Improvement Amendments CLIA as qualified to perform high complexity clinical laboratory testing.    MEROPENEM Value in next row Sensitive      <=4 SENSITIVEThis is a modified FDA-approved test that has been validated and its performance characteristics determined by the reporting laboratory.  This  laboratory is certified under the Clinical Laboratory Improvement Amendments CLIA as qualified to perform high complexity clinical laboratory testing.    * >=100,000 COLONIES/mL ESCHERICHIA COLI  Blood culture (routine x 2)     Status: None (Preliminary result)   Collection Time: 07/12/24 11:35 PM   Specimen: BLOOD  Result Value Ref Range Status   Specimen Description   Final    BLOOD RIGHT ANTECUBITAL Performed at [facility], [address] [place], [place] [address]    Special Requests   Final    BOTTLES DRAWN AEROBIC AND ANAEROBIC Blood Culture adequate volume Performed at [facility], [address] [place], [place] [address]    Culture   Final    NO GROWTH < 24 HOURS Performed at [facility], [address] [place], [place] [address]    Report Status PENDING  Incomplete  Blood culture (routine x 2)     Status: None (Preliminary result)  Collection Time: 07/12/24 11:35 PM   Specimen: BLOOD  Result Value Ref Range Status   Specimen Description   Final    BLOOD LEFT ANTECUBITAL Performed at [facility], [address] [place], [place] [address]    Special Requests   Final    BOTTLES DRAWN AEROBIC AND ANAEROBIC Blood Culture results may not be optimal due to an inadequate volume of blood received in culture bottles Performed at [facility], [address] [place], [place] [address]    Culture   Final    NO GROWTH < 24 HOURS Performed at [facility], [address], [place], [place] [address]    Report Status PENDING  Incomplete         Radiology Studies: DG Abd 2 Views Result Date: 07/15/2024 EXAM: 2 VIEW XRAY OF THE ABDOMEN 07/15/2024 01:03:00 PM COMPARISON: Comparison with 07/14/2024. CLINICAL HISTORY: 358444 Constipation 358444 Constipation. FINDINGS: BOWEL: Scattered gas and stool throughout the colon. No small or large bowel distention. SOFT TISSUES: No radiopaque stones. Calcification in the  pelvis is consistent with a fibroid. No change. Soft tissue contours appear intact. Lung bases are clear. BONES: Degenerative changes in the spine and hips. IMPRESSION: 1. Nonobstructive bowel gas pattern without evidence of bowel obstruction. 2. Pelvic calcification consistent with uterine fibroid, unchanged, relevant to pelvic findings. 3. Degenerative changes in the spine and hips, unchanged. Electronically signed by: Elsie Gravely MD 07/15/2024 01:08 PM EST RP Workstation: [id number]   DG Abd 2 Views Result Date: 07/14/2024 EXAM: 2 VIEW XRAY OF THE ABDOMEN 07/14/2024 08:58:00 AM COMPARISON: CT abdomen and pelvis 07/12/2024. CLINICAL HISTORY: 58 year old female with constipation. FINDINGS: BOWEL: Nonobstructive bowel gas pattern. Moderate colonic stool burden, abundant retained stool in the colon not significantly changed from the scout view of the recent CT. SOFT TISSUES: No opaque urinary calculi. Calcified fibroid overlying the left pelvis. Stable abdominal and pelvic visceral contours, pelvic phleboliths and uterine fibroid calcifications. BONES: No acute osseous abnormality. IMPRESSION: 1. Nonobstructive bowel gas pattern with abundant colonic stool not changed from recent CT abdomen and pelvis . 2. No free intraperitoneal air. Electronically signed by: Helayne Hurst MD 07/14/2024 09:10 AM EST RP Workstation: [id number]        Scheduled Meds:  oxyCODONE   10 mg Oral TID   And   acetaminophen   325 mg Oral TID   amLODipine   10 mg Oral Daily   enoxaparin  (LOVENOX ) injection  40 mg Subcutaneous Q24H   insulin  aspart  0-24 Units Subcutaneous TID WC   Methylnaltrexone  Bromide  3 tablet Oral Daily   metoprolol  succinate  25 mg Oral Daily   pregabalin   150 mg Oral Q12H   rosuvastatin   20 mg Oral Daily   Continuous Infusions:  cefTRIAXone  (ROCEPHIN )  IV 2 g (07/15/24 2125)   lactated ringers  125 mL/hr at 07/15/24 2303     LOS: 3 days    Time spent: 35 minutes    Leatrice Chapel,  MD  [facility] Pager #: [phone] 7PM-7AM contact night coverage as above

## 2024-07-16 NOTE — Discharge Summary (Signed)
 Physician Discharge Summary  Patient ID: [name] MRN: [id number] DOB/AGE: 1966-01-23 58 y.o.  Admit date: 07/12/2024 Discharge date: 07/16/2024  Admission Diagnoses:  Discharge Diagnoses:  Principal Problem:   UTI (urinary tract infection) Active Problems:   Pyelonephritis   Discharged Condition: {condition:18240}  Hospital Course: ***  Consults: {consultation:18241}  Significant Diagnostic Studies: {diagnostics:18242}  Treatments: {Tx:18249}  Discharge Exam: Blood pressure (!) 147/74, pulse (!) 50, temperature 98 F (36.7 C), resp. rate 16, height 5' 4 (1.626 m), weight 82.7 kg, last menstrual period 05/18/2012, SpO2 98%. {physical zkjf:6958869}  Disposition: Discharge disposition: 01-Home or Self Care       Discharge Instructions     Diet - low sodium heart healthy   Complete by: As directed    Increase activity slowly   Complete by: As directed       Allergies as of 07/16/2024       Reactions   Ampicillin  Hives, Itching   Has patient had a PCN reaction causing immediate rash, facial/tongue/throat swelling, SOB or lightheadedness with hypotension: Y Has patient had a PCN reaction causing severe rash involving mucus membranes or skin necrosis: Y Has patient had a PCN reaction that required hospitalization: Y Has patient had a PCN reaction occurring within the last 10 years: Y If all of the above answers are NO, then may proceed with Cephalosporin use.        Medication List     STOP taking these medications    folic acid  1 MG tablet Commonly known as: FOLVITE    glimepiride 2 MG tablet Commonly known as: AMARYL   methocarbamol  500 MG tablet Commonly known as: ROBAXIN    naproxen 500 MG tablet Commonly known as: NAPROSYN       TAKE these medications    amLODipine  10 MG tablet Commonly known as: NORVASC  Take 1 tablet (10 mg total) by mouth daily.   cefadroxil  500 MG capsule Commonly known as: DURICEF Take 2 capsules  (1,000 mg total) by mouth 2 (two) times daily for 4 days.   cetirizine 10 MG tablet Commonly known as: ZYRTEC Take 10 mg by mouth daily.   diclofenac  sodium 1 % Gel Commonly known as: VOLTAREN  Apply 2 g topically 4 (four) times daily as needed (pain).   metFORMIN  500 MG tablet Commonly known as: GLUCOPHAGE  Take 1 tablet (500 mg total) by mouth 2 (two) times daily with a meal. What changed: when to take this   metoprolol  succinate 25 MG 24 hr tablet Commonly known as: TOPROL -XL Take 25 mg by mouth daily.   Narcan  4 MG/0.1ML Liqd nasal spray kit Generic drug: naloxone  Place 1 spray into the nose once.   oxyCODONE -acetaminophen  10-325 MG tablet Commonly known as: PERCOCET Take 1 tablet by mouth 3 (three) times daily.   predniSONE  20 MG tablet Commonly known as: DELTASONE  Take 2 tablets (40 mg total) by mouth daily with breakfast.   pregabalin  150 MG capsule Commonly known as: LYRICA  Take 150 mg by mouth every 12 (twelve) hours.   Relistor  150 MG Tabs Generic drug: Methylnaltrexone  Bromide Take 3 tablets (450 mg total) by mouth daily. Start taking on: July 17, 2024 What changed: how much to take   rosuvastatin  20 MG tablet Commonly known as: CRESTOR  Take 20 mg by mouth daily.   sucralfate 1 g tablet Commonly known as: CARAFATE Take 1 g by mouth 2 (two) times daily as needed (stomach).   Vitamin D (Ergocalciferol) 1.25 MG (50000 UNIT) Caps capsule Commonly known as: DRISDOL TAKE 1 CAPSULE  BY MOUTH TWICE A WEEK        Follow-up Information     Osei-Bonsu, Zachary, MD Follow up in 1 week(s).   Specialty: Internal Medicine Contact information: [address] [place] [address] [phone]                 Signed: Leatrice LILLETTE Chapel 07/16/2024, 1:30 PM

## 2024-07-16 NOTE — Plan of Care (Signed)
  Problem: Education: Goal: Knowledge of General Education information will improve Description: Including pain rating scale, medication(s)/side effects and non-pharmacologic comfort measures 07/16/2024 1418 by Malesha Suliman C, RN Outcome: Adequate for Discharge 07/16/2024 1102 by Renella Steig C, RN Outcome: Progressing   Problem: Health Behavior/Discharge Planning: Goal: Ability to manage health-related needs will improve 07/16/2024 1418 by Dre Gamino C, RN Outcome: Adequate for Discharge 07/16/2024 1102 by Marija Calamari C, RN Outcome: Progressing   Problem: Clinical Measurements: Goal: Ability to maintain clinical measurements within normal limits will improve 07/16/2024 1418 by Monti Villers C, RN Outcome: Adequate for Discharge 07/16/2024 1102 by Aedan Geimer C, RN Outcome: Progressing Goal: Will remain free from infection 07/16/2024 1418 by Elzora Cullins C, RN Outcome: Adequate for Discharge 07/16/2024 1102 by Juleah Paradise C, RN Outcome: Progressing Goal: Diagnostic test results will improve 07/16/2024 1418 by Jaythan Hinely C, RN Outcome: Adequate for Discharge 07/16/2024 1102 by Aundra Pung C, RN Outcome: Progressing Goal: Respiratory complications will improve 07/16/2024 1418 by Mayling Aber C, RN Outcome: Adequate for Discharge 07/16/2024 1102 by Sacha Topor C, RN Outcome: Progressing Goal: Cardiovascular complication will be avoided 07/16/2024 1418 by Akul Leggette C, RN Outcome: Adequate for Discharge 07/16/2024 1102 by Jeffree Cazeau C, RN Outcome: Progressing   Problem: Activity: Goal: Risk for activity intolerance will decrease 07/16/2024 1418 by Saadiya Wilfong C, RN Outcome: Adequate for Discharge 07/16/2024 1102 by Raelan Burgoon C, RN Outcome: Progressing   Problem: Nutrition: Goal: Adequate nutrition will be maintained 07/16/2024 1418 by Chiyo Fay C, RN Outcome: Adequate for Discharge 07/16/2024 1102  by Lataysha Vohra C, RN Outcome: Progressing   Problem: Coping: Goal: Level of anxiety will decrease 07/16/2024 1418 by Wyndham Santilli C, RN Outcome: Adequate for Discharge 07/16/2024 1102 by Jerney Baksh C, RN Outcome: Progressing   Problem: Elimination: Goal: Will not experience complications related to bowel motility 07/16/2024 1418 by Lonza Shimabukuro C, RN Outcome: Adequate for Discharge 07/16/2024 1102 by Shannie Kontos C, RN Outcome: Progressing Goal: Will not experience complications related to urinary retention 07/16/2024 1418 by Lilburn Straw C, RN Outcome: Adequate for Discharge 07/16/2024 1102 by Mira Balon C, RN Outcome: Progressing   Problem: Pain Managment: Goal: General experience of comfort will improve and/or be controlled 07/16/2024 1418 by Lateria Alderman C, RN Outcome: Adequate for Discharge 07/16/2024 1102 by Amra Shukla C, RN Outcome: Progressing   Problem: Safety: Goal: Ability to remain free from injury will improve 07/16/2024 1418 by Denette Hass C, RN Outcome: Adequate for Discharge 07/16/2024 1102 by Kaira Stringfield C, RN Outcome: Progressing   Problem: Skin Integrity: Goal: Risk for impaired skin integrity will decrease 07/16/2024 1418 by Gussie Towson C, RN Outcome: Adequate for Discharge 07/16/2024 1102 by Elaya Droege C, RN Outcome: Progressing

## 2024-07-17 ENCOUNTER — Inpatient Hospital Stay (HOSPITAL_BASED_OUTPATIENT_CLINIC_OR_DEPARTMENT_OTHER): Admitting: Hematology and Oncology

## 2024-07-17 ENCOUNTER — Encounter: Payer: Self-pay | Admitting: Hematology and Oncology

## 2024-07-17 ENCOUNTER — Telehealth: Payer: Self-pay

## 2024-07-17 ENCOUNTER — Inpatient Hospital Stay

## 2024-07-17 VITALS — BP 136/82 | HR 62 | Temp 97.9°F | Resp 19 | Ht 64.0 in | Wt 183.8 lb

## 2024-07-17 DIAGNOSIS — D61818 Other pancytopenia: Secondary | ICD-10-CM | POA: Diagnosis present

## 2024-07-17 DIAGNOSIS — E1140 Type 2 diabetes mellitus with diabetic neuropathy, unspecified: Secondary | ICD-10-CM | POA: Diagnosis not present

## 2024-07-17 DIAGNOSIS — D693 Immune thrombocytopenic purpura: Secondary | ICD-10-CM | POA: Diagnosis not present

## 2024-07-17 DIAGNOSIS — Z8719 Personal history of other diseases of the digestive system: Secondary | ICD-10-CM | POA: Diagnosis not present

## 2024-07-17 LAB — CBC WITH DIFFERENTIAL/PLATELET
Abs Immature Granulocytes: 0.51 K/uL — ABNORMAL HIGH (ref 0.00–0.07)
Basophils Absolute: 0 K/uL (ref 0.0–0.1)
Basophils Relative: 0 %
Eosinophils Absolute: 0 K/uL (ref 0.0–0.5)
Eosinophils Relative: 0 %
HCT: 34.4 % — ABNORMAL LOW (ref 36.0–46.0)
Hemoglobin: 12.2 g/dL (ref 12.0–15.0)
Immature Granulocytes: 6 %
Lymphocytes Relative: 32 %
Lymphs Abs: 2.9 K/uL (ref 0.7–4.0)
MCH: 29.2 pg (ref 26.0–34.0)
MCHC: 35.5 g/dL (ref 30.0–36.0)
MCV: 82.3 fL (ref 80.0–100.0)
Monocytes Absolute: 1.2 K/uL — ABNORMAL HIGH (ref 0.1–1.0)
Monocytes Relative: 14 %
Neutro Abs: 4.3 K/uL (ref 1.7–7.7)
Neutrophils Relative %: 48 %
Platelets: 87 K/uL — ABNORMAL LOW (ref 150–400)
RBC: 4.18 MIL/uL (ref 3.87–5.11)
RDW: 13.8 % (ref 11.5–15.5)
WBC: 8.9 K/uL (ref 4.0–10.5)
nRBC: 0.4 % — ABNORMAL HIGH (ref 0.0–0.2)

## 2024-07-17 NOTE — Transitions of Care (Post Inpatient/ED Visit) (Signed)
   07/17/2024  Name: [name] MRN: [id number] DOB: 03/11/1966  Today's TOC FU Call Status: Today's TOC FU Call Status:: Unsuccessful Call (1st Attempt) Unsuccessful Call (1st Attempt) Date: 07/17/24  Attempted to reach the patient regarding the most recent Inpatient/ED visit.  Follow Up Plan: Additional outreach attempts will be made to reach the patient to complete the Transitions of Care (Post Inpatient/ED visit) call.   Brianah Hopson J. Sheli Dorin RN, MSN [facility], [facility] Health RN Care Manager Direct Dial: [phone]  Fax: [phone] Website: [email or web address]

## 2024-07-17 NOTE — Progress Notes (Signed)
 [facility] OFFICE PROGRESS NOTE  Patient Care Team: Catalina Bare, MD as PCP - General (Internal Medicine) Brantley Nancyann SQUIBB, MD (Thoracic Surgery) Maye Ade, MD (Inactive) (Cardiology)  Assessment & Plan Other pancytopenia [facility]) She was evaluated by another hematologist in 2020 for pancytopenia I saw her in September 2025 and ordered extensive workup Myeloma panel, autoimmune screen, infectious disease workup were all negative.  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; as ITP is a diagnosis of exclusion, this fits the picture Her anemia is likely related to chronic illness related to her diabetes 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 She started prednisone  at 60 mg on July 03, 2024 So far, she tolerated treatment well except for some unpleasant side effects such as feeling jittery and ineffective blood sugar Repeat CBC showed improvement of her platelet count I recommend prednisone  taper to 40 mg starting November 19 She was recently hospitalized for infection and her treatment was placed on hold temporarily Repeat platelet count today shows stability For now, I do not plan to adjust the dose of her prednisone  She will continue 40 mg daily and I plan to see her again next week for further follow-up  No orders of the defined types were placed in this encounter.    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 Patient denies recent bleeding such as  epistaxis, hematuria or hematochezia We reviewed medication list and discussed medication changes We discussed test results and future plan of care as outlined above  PHYSICAL EXAMINATION: ECOG PERFORMANCE STATUS: 0 - Asymptomatic  Vitals:   07/17/24 1145  BP: 136/82  Pulse: 62  Resp: 19  Temp: 97.9 F (36.6 C)  SpO2: 100%   Lab Results  Component Value Date   WBC 8.9 07/17/2024   HGB 12.2 07/17/2024   HCT 34.4 (L) 07/17/2024   MCV 82.3 07/17/2024   PLT 87 (L) 07/17/2024   SUMMARY OF HEMATOLOGIC HISTORY:   She was evaluated by another hematologist in 2020 for pancytopenia I saw her in September 2025 and ordered extensive workup Myeloma panel, autoimmune screen, infectious disease workup were all negative.  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

## 2024-07-17 NOTE — Assessment & Plan Note (Addendum)
 She was evaluated by another hematologist in 2020 for pancytopenia I saw her in September 2025 and ordered extensive workup Myeloma panel, autoimmune screen, infectious disease workup were all negative.  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; as ITP is a diagnosis of exclusion, this fits the picture Her anemia is likely related to chronic illness related to her diabetes 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 She started prednisone  at 60 mg on July 03, 2024 So far, she tolerated treatment well except for some unpleasant side effects such as feeling jittery and ineffective blood sugar Repeat CBC showed improvement of her platelet count I recommend prednisone  taper to 40 mg starting November 19 She was recently hospitalized for infection and her treatment was placed on hold temporarily Repeat platelet count today shows stability For now, I do not plan to adjust the dose of her prednisone  She will continue 40 mg daily and I plan to see her again next week for further follow-up

## 2024-07-18 ENCOUNTER — Telehealth: Payer: Self-pay

## 2024-07-18 LAB — CULTURE, BLOOD (ROUTINE X 2)
Culture: NO GROWTH
Culture: NO GROWTH
Special Requests: ADEQUATE

## 2024-07-18 NOTE — Patient Instructions (Signed)
 Visit Information  Thank you for taking time to visit with me today.   Drink plenty of water or other fluids. Take antibiotics exactly as your healthcare provider tells you. Monitor for fever, chills, increased urination, burning on urination, blood in urine, nausea, vomiting, or pain in abdomen   Patient verbalizes understanding of instructions and care plan provided today and agrees to view in MyChart. Active MyChart status and patient understanding of how to access instructions and care plan via MyChart confirmed with patient.     The patient has been provided with contact information for the care management team and has been advised to call with any health related questions or concerns.   Please call the care guide team at [phone] if you need to cancel or reschedule your appointment.   Please call the Suicide and Crisis Lifeline: 988 if you are experiencing a Mental Health or Behavioral Health Crisis or need someone to talk to.  Jaidyn Kuhl J. Yulanda Diggs RN, MSN [facility], [facility] Health RN Care Manager Direct Dial: [phone]  Fax: [phone] Website: [email or web address]

## 2024-07-18 NOTE — Transitions of Care (Post Inpatient/ED Visit) (Signed)
 07/18/2024  Name: [name] MRN: [id number] DOB: 12-22-1965  Today's TOC FU Call Status: Today's TOC FU Call Status:: Successful TOC FU Call Completed TOC FU Call Complete Date: 07/18/24  Patient's Name and Date of Birth confirmed. DOB, Name  Transition Care Management Follow-up Telephone Call Date of Discharge: 07/16/24 Discharge Facility: [facility]) Type of Discharge: Inpatient Admission Primary Inpatient Discharge Diagnosis:: UTI How have you been since you were released from the hospital?: Better Any questions or concerns?: No  Items Reviewed: Did you receive and understand the discharge instructions provided?: Yes Medications obtained,verified, and reconciled?: Yes (Medications Reviewed) Any new allergies since your discharge?: No Dietary orders reviewed?: Yes Type of Diet Ordered:: Heart healthy Do you have support at home?: Yes People in Home [RPT]: spouse Name of Support/Comfort Primary Source: Albert  Medications Reviewed Today: Medications Reviewed Today     Reviewed by Foster Sonnier, RN (Case Manager) on 07/18/24 at 1027  Med List Status: <None>   Medication Order Taking? Sig Documenting Provider Last Dose Status Informant  amLODipine  (NORVASC ) 10 MG tablet [id number] Yes Take 1 tablet (10 mg total) by mouth daily. Jillian Buttery, MD  Active Spouse/Significant Other, Pharmacy Records  cefadroxil  (DURICEF) 500 MG capsule [id number] Yes Take 2 capsules (1,000 mg total) by mouth 2 (two) times daily for 4 days. Rosario Leatrice FERNS, MD  Active   cetirizine (ZYRTEC) 10 MG tablet [id number] Yes Take 10 mg by mouth daily. [provider]  Active Spouse/Significant Other, Pharmacy Records  diclofenac  sodium (VOLTAREN ) 1 % GEL [id number]  Apply 2 g topically 4 (four) times daily as needed (pain). [provider]  Active Spouse/Significant Other, Pharmacy Records  metFORMIN  (GLUCOPHAGE ) 500 MG tablet [id number] Yes Take 1 tablet (500 mg total) by  mouth 2 (two) times daily with a meal. Rosario Leatrice FERNS, MD  Active   Methylnaltrexone  Bromide (RELISTOR ) 150 MG TABS [id number] Yes Take 3 tablets (450 mg total) by mouth daily. Rosario Leatrice FERNS, MD  Active   metoprolol  succinate (TOPROL -XL) 25 MG 24 hr tablet [id number] Yes Take 25 mg by mouth daily. [provider]  Active Spouse/Significant Other, Pharmacy Records  NARCAN  4 MG/0.1ML LIQD nasal spray kit [id number] Yes Place 1 spray into the nose once. [provider]  Active Spouse/Significant Other, Pharmacy Records  oxyCODONE -acetaminophen  (PERCOCET) 10-325 MG tablet [id number] Yes Take 1 tablet by mouth 3 (three) times daily. [provider]  Active Spouse/Significant Other, Pharmacy Records  predniSONE  (DELTASONE ) 20 MG tablet [id number] Yes Take 2 tablets (40 mg total) by mouth daily with breakfast. Lonn Hicks, MD  Active Spouse/Significant Other, Pharmacy Records  pregabalin  (LYRICA ) 150 MG capsule [id number] Yes Take 150 mg by mouth every 12 (twelve) hours. [provider]  Active Spouse/Significant Other, Pharmacy Records  rosuvastatin  (CRESTOR ) 20 MG tablet [id number] Yes Take 20 mg by mouth daily. [provider]  Active Spouse/Significant Other, Pharmacy Records  sucralfate (CARAFATE) 1 g tablet [id number] Yes Take 1 g by mouth 2 (two) times daily as needed (stomach). [provider]  Active Spouse/Significant Other, Pharmacy Records  Vitamin D, Ergocalciferol, (DRISDOL) 50000 units CAPS capsule [id number] Yes TAKE 1 CAPSULE BY MOUTH TWICE A WEEK [provider]  Active Spouse/Significant Other, Pharmacy Records            Home Care and Equipment/Supplies: Were Home Health Services Ordered?: NA Any new equipment or medical supplies ordered?: NA  Functional Questionnaire: Do you need assistance with bathing/showering or dressing?: No Do you need assistance  with meal preparation?: No Do you need assistance with eating?:  No Do you have difficulty maintaining continence: No Do you need assistance with getting out of bed/getting out of a chair/moving?: No Do you have difficulty managing or taking your medications?: No  Follow up appointments reviewed: PCP Follow-up appointment confirmed?: No (patient/spouse to call) MD Provider Line [phone] Given: No Specialist Hospital Follow-up appointment confirmed?: Yes Date of Specialist follow-up appointment?: 07/17/24 Follow-Up Specialty Provider:: Oncology Do you need transportation to your follow-up appointment?: No Do you understand care options if your condition(s) worsen?: Yes-patient verbalized understanding  SDOH Interventions Today    Flowsheet Row Most Recent Value  SDOH Interventions   Food Insecurity Interventions Intervention Not Indicated  Housing Interventions Intervention Not Indicated  Utilities Interventions Intervention Not Indicated    Jaiveer Panas J. Shyloh Krinke RN, MSN [facility] Health  [facility], [facility] RN Care Manager Direct Dial: [phone]  Fax: [phone] Website: [email or web address]

## 2024-07-24 ENCOUNTER — Inpatient Hospital Stay: Admitting: Hematology and Oncology

## 2024-07-24 ENCOUNTER — Inpatient Hospital Stay: Attending: Hematology and Oncology

## 2024-07-24 ENCOUNTER — Encounter: Payer: Self-pay | Admitting: Hematology and Oncology

## 2024-07-24 VITALS — BP 136/75 | HR 58 | Temp 98.1°F | Resp 18 | Ht 64.0 in | Wt 179.8 lb

## 2024-07-24 DIAGNOSIS — Z79899 Other long term (current) drug therapy: Secondary | ICD-10-CM | POA: Diagnosis not present

## 2024-07-24 DIAGNOSIS — D693 Immune thrombocytopenic purpura: Secondary | ICD-10-CM | POA: Insufficient documentation

## 2024-07-24 DIAGNOSIS — D61818 Other pancytopenia: Secondary | ICD-10-CM | POA: Diagnosis not present

## 2024-07-24 DIAGNOSIS — E119 Type 2 diabetes mellitus without complications: Secondary | ICD-10-CM | POA: Insufficient documentation

## 2024-07-24 LAB — CBC WITH DIFFERENTIAL/PLATELET
Abs Immature Granulocytes: 0.17 K/uL — ABNORMAL HIGH (ref 0.00–0.07)
Basophils Absolute: 0 K/uL (ref 0.0–0.1)
Basophils Relative: 0 %
Eosinophils Absolute: 0 K/uL (ref 0.0–0.5)
Eosinophils Relative: 0 %
HCT: 31.3 % — ABNORMAL LOW (ref 36.0–46.0)
Hemoglobin: 10.9 g/dL — ABNORMAL LOW (ref 12.0–15.0)
Immature Granulocytes: 2 %
Lymphocytes Relative: 12 %
Lymphs Abs: 1.1 K/uL (ref 0.7–4.0)
MCH: 29.1 pg (ref 26.0–34.0)
MCHC: 34.8 g/dL (ref 30.0–36.0)
MCV: 83.7 fL (ref 80.0–100.0)
Monocytes Absolute: 0.7 K/uL (ref 0.1–1.0)
Monocytes Relative: 7 %
Neutro Abs: 7.6 K/uL (ref 1.7–7.7)
Neutrophils Relative %: 79 %
Platelets: 60 K/uL — ABNORMAL LOW (ref 150–400)
RBC: 3.74 MIL/uL — ABNORMAL LOW (ref 3.87–5.11)
RDW: 14.8 % (ref 11.5–15.5)
WBC: 9.6 K/uL (ref 4.0–10.5)
nRBC: 0 % (ref 0.0–0.2)

## 2024-07-24 NOTE — Assessment & Plan Note (Addendum)
 She was evaluated by another hematologist in 2020 for pancytopenia I saw her in September 2025 and ordered extensive workup Myeloma panel, autoimmune screen, infectious disease workup were all negative.  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; as ITP is a diagnosis of exclusion, this fits the picture Her anemia is likely related to chronic illness related to her diabetes 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 She started prednisone  at 60 mg on July 03, 2024 So far, she tolerated treatment well except for some unpleasant side effects such as feeling jittery and ineffective blood sugar Repeat CBC showed improvement of her platelet count I recommend prednisone  taper to 40 mg starting November 19 Unfortunately, with recent steroid taper, her platelet count start dropping again At this point, 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

## 2024-07-24 NOTE — Progress Notes (Signed)
 [facility] OFFICE PROGRESS NOTE  Patient Care Team: Catalina Bare, MD as PCP - General (Internal Medicine) Brantley Nancyann SQUIBB, MD (Thoracic Surgery) Maye Ade, MD (Inactive) (Cardiology)  Assessment & Plan Other pancytopenia [facility]) She was evaluated by another hematologist in 2020 for pancytopenia I saw her in September 2025 and ordered extensive workup Myeloma panel, autoimmune screen, infectious disease workup were all negative.  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; as ITP is a diagnosis of exclusion, this fits the picture Her anemia is likely related to chronic illness related to her diabetes 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 She started prednisone  at 60 mg on July 03, 2024 So far, she tolerated treatment well except for some unpleasant side effects such as feeling jittery and ineffective blood sugar Repeat CBC showed improvement of her platelet count I recommend prednisone  taper to 40 mg starting November 19 Unfortunately, with recent steroid taper, her platelet count start dropping again At this point, 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    Almarie Bedford, MD  INTERVAL HISTORY: she returns for  surveillance follow-up for recurrent ITP Patient denies recent bleeding such as epistaxis, hematuria or hematochezia We reviewed medication list and discussed medication changes We discussed test results and future plan of care as outlined above  PHYSICAL EXAMINATION: ECOG PERFORMANCE STATUS: 0 - Asymptomatic  Vitals:   07/24/24 1532  BP: 136/75  Pulse: (!) 58  Resp: 18  Temp: 98.1 F (36.7 C)  SpO2: 98%   Lab Results  Component Value Date   WBC 8.9 07/17/2024   HGB 12.2 07/17/2024   HCT 34.4 (L) 07/17/2024   MCV 82.3 07/17/2024   PLT 87 (L) 07/17/2024   SUMMARY OF HEMATOLOGIC HISTORY:  She was evaluated by another hematologist in 2020 for pancytopenia I saw her in September 2025 and ordered extensive workup Myeloma panel, autoimmune screen, infectious disease workup were all negative.  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

## 2024-07-25 ENCOUNTER — Other Ambulatory Visit: Payer: Self-pay | Admitting: Hematology and Oncology

## 2024-07-25 ENCOUNTER — Telehealth: Payer: Self-pay

## 2024-07-25 DIAGNOSIS — D693 Immune thrombocytopenic purpura: Secondary | ICD-10-CM | POA: Insufficient documentation

## 2024-07-25 NOTE — Telephone Encounter (Signed)
 Called Dr. Lonn to inform him that the patient's wife has chosen immunotherapy.

## 2024-07-26 ENCOUNTER — Other Ambulatory Visit: Payer: Self-pay

## 2024-07-26 ENCOUNTER — Encounter: Payer: Self-pay | Admitting: Hematology and Oncology

## 2024-07-26 ENCOUNTER — Telehealth: Payer: Self-pay

## 2024-07-26 MED ORDER — PREDNISONE 20 MG PO TABS: 40.0000 mg | ORAL_TABLET | Freq: Every day | ORAL | 0 refills | Status: DC

## 2024-07-26 NOTE — Telephone Encounter (Signed)
 Called husband and review 12/12 appts. He is aware of appts. Told him Dr. Lonn sent Prednisone  refill to local pharmacy. He is aware and will pick up Rx.

## 2024-07-27 ENCOUNTER — Other Ambulatory Visit: Payer: Self-pay | Admitting: Hematology and Oncology

## 2024-07-27 ENCOUNTER — Other Ambulatory Visit: Payer: Self-pay

## 2024-08-02 NOTE — Progress Notes (Signed)
 Pharmacist Chemotherapy Monitoring - Initial Assessment    Anticipated start date: 08/03/24   The following has been reviewed per standard work regarding the patient's treatment regimen: The patient's diagnosis, treatment plan and drug doses, and organ/hematologic function Lab orders and baseline tests specific to treatment regimen  The treatment plan start date, drug sequencing, and pre-medications Prior authorization status  Patient's documented medication list, including drug-drug interaction screen and prescriptions for anti-emetics and supportive care specific to the treatment regimen The drug concentrations, fluid compatibility, administration routes, and timing of the medications to be used The patient's access for treatment and lifetime cumulative dose history, if applicable  The patient's medication allergies and previous infusion related reactions, if applicable   Changes made to treatment plan:  N/A  Follow up needed:  N/A  [name], RPH, 08/02/2024  12:22 PM

## 2024-08-03 ENCOUNTER — Inpatient Hospital Stay: Admitting: Hematology and Oncology

## 2024-08-03 ENCOUNTER — Inpatient Hospital Stay

## 2024-08-03 ENCOUNTER — Encounter: Payer: Self-pay | Admitting: Hematology and Oncology

## 2024-08-03 VITALS — BP 117/68 | HR 73 | Temp 97.7°F | Resp 16 | Wt 178.5 lb

## 2024-08-03 DIAGNOSIS — D61818 Other pancytopenia: Secondary | ICD-10-CM

## 2024-08-03 DIAGNOSIS — D693 Immune thrombocytopenic purpura: Secondary | ICD-10-CM | POA: Diagnosis not present

## 2024-08-03 LAB — CBC WITH DIFFERENTIAL/PLATELET
Abs Immature Granulocytes: 0 K/uL (ref 0.00–0.07)
Basophils Absolute: 0 K/uL (ref 0.0–0.1)
Basophils Relative: 1 %
Eosinophils Absolute: 0.1 K/uL (ref 0.0–0.5)
Eosinophils Relative: 2 %
HCT: 32.7 % — ABNORMAL LOW (ref 36.0–46.0)
Hemoglobin: 11 g/dL — ABNORMAL LOW (ref 12.0–15.0)
Immature Granulocytes: 0 %
Lymphocytes Relative: 43 %
Lymphs Abs: 1.5 K/uL (ref 0.7–4.0)
MCH: 28.7 pg (ref 26.0–34.0)
MCHC: 33.6 g/dL (ref 30.0–36.0)
MCV: 85.4 fL (ref 80.0–100.0)
Monocytes Absolute: 0.7 K/uL (ref 0.1–1.0)
Monocytes Relative: 21 %
Neutro Abs: 1.2 K/uL — ABNORMAL LOW (ref 1.7–7.7)
Neutrophils Relative %: 33 %
Platelets: 75 K/uL — ABNORMAL LOW (ref 150–400)
RBC: 3.83 MIL/uL — ABNORMAL LOW (ref 3.87–5.11)
RDW: 14.8 % (ref 11.5–15.5)
WBC: 3.5 K/uL — ABNORMAL LOW (ref 4.0–10.5)
nRBC: 0 % (ref 0.0–0.2)

## 2024-08-03 MED ORDER — PREDNISONE 20 MG PO TABS: ORAL_TABLET | ORAL | Status: DC

## 2024-08-03 MED ORDER — SODIUM CHLORIDE 0.9 % IV SOLN: INTRAVENOUS | Status: DC

## 2024-08-03 MED ORDER — SODIUM CHLORIDE 0.9 % IV SOLN
375.0000 mg/m2 | Freq: Once | INTRAVENOUS | Status: AC
  Administered 2024-08-03: 700 mg via INTRAVENOUS
  Filled 2024-08-03: qty 50

## 2024-08-03 MED ORDER — ACETAMINOPHEN 325 MG PO TABS
650.0000 mg | ORAL_TABLET | Freq: Once | ORAL | Status: AC
  Administered 2024-08-03: 650 mg via ORAL
  Filled 2024-08-03: qty 2

## 2024-08-03 MED ORDER — DIPHENHYDRAMINE HCL 25 MG PO CAPS
50.0000 mg | ORAL_CAPSULE | Freq: Once | ORAL | Status: AC
  Administered 2024-08-03: 50 mg via ORAL
  Filled 2024-08-03: qty 2

## 2024-08-03 NOTE — Patient Instructions (Signed)
 [facility] - A DEPT OF [facility]  Discharge Instructions: Thank you for choosing [facility] to provide your oncology and hematology care.   If you have a lab appointment with the Cancer Center, please go directly to the Cancer Center and check in at the registration area.   Wear comfortable clothing and clothing appropriate for easy access to any Portacath or PICC line.   We strive to give you quality time with your provider. You may need to reschedule your appointment if you arrive late (15 or more minutes).  Arriving late affects you and other patients whose appointments are after yours.  Also, if you miss three or more appointments without notifying the office, you may be dismissed from the clinic at the provider's discretion.      For prescription refill requests, have your pharmacy contact our office and allow 72 hours for refills to be completed.    Today you received the following chemotherapy and/or immunotherapy agents rituxan      To help prevent nausea and vomiting after your treatment, we encourage you to take your nausea medication as directed.  BELOW ARE SYMPTOMS THAT SHOULD BE REPORTED IMMEDIATELY: *FEVER GREATER THAN 100.4 F (38 C) OR HIGHER *CHILLS OR SWEATING *NAUSEA AND VOMITING THAT IS NOT CONTROLLED WITH YOUR NAUSEA MEDICATION *UNUSUAL SHORTNESS OF BREATH *UNUSUAL BRUISING OR BLEEDING *URINARY PROBLEMS (pain or burning when urinating, or frequent urination) *BOWEL PROBLEMS (unusual diarrhea, constipation, pain near the anus) TENDERNESS IN MOUTH AND THROAT WITH OR WITHOUT PRESENCE OF ULCERS (sore throat, sores in mouth, or a toothache) UNUSUAL RASH, SWELLING OR PAIN  UNUSUAL VAGINAL DISCHARGE OR ITCHING   Items with * indicate a potential emergency and should be followed up as soon as possible or go to the Emergency Department if any problems should occur.  Please show the CHEMOTHERAPY ALERT CARD or IMMUNOTHERAPY  ALERT CARD at check-in to the Emergency Department and triage nurse.  Should you have questions after your visit or need to cancel or reschedule your appointment, please contact [facility] - A DEPT [facility]  Dept: [phone]  and follow the prompts.  Office hours are 8:00 a.m. to 4:30 p.m. Monday - Friday. Please note that voicemails left after 4:00 p.m. may not be returned until the following business day.  We are closed weekends and major holidays. You have access to a nurse at all times for urgent questions. Please call the main number to the clinic Dept: [phone] and follow the prompts.   For any non-urgent questions, you may also contact your provider using MyChart. We now offer e-Visits for anyone 2 and older to request care online for non-urgent symptoms. For details visit [email or web address].   Also download the MyChart app! Go to the app store, search "MyChart", open the app, select [place], and log in with your MyChart username and password.

## 2024-08-03 NOTE — Progress Notes (Signed)
 [facility] OFFICE PROGRESS NOTE  Patient Care Team: Catalina Bare, MD as PCP - General (Internal Medicine) Brantley Nancyann SQUIBB, MD (Thoracic Surgery) Maye Ade, MD (Inactive) (Cardiology)  Assessment & Plan Other pancytopenia [facility]) She was evaluated by another hematologist in 2020 for pancytopenia I saw her in September 2025 and ordered extensive workup Myeloma panel, autoimmune screen, infectious disease workup were all negative.  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; as ITP is a diagnosis of exclusion, this fits the picture Her anemia is likely related to chronic illness related to her diabetes 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 She started prednisone  at 60 mg on July 03, 2024 I recommend prednisone  taper to 40 mg starting November 19 Unfortunately, with recent steroid taper, her platelet count start dropping again At this point, 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  No orders of the defined types were placed in this encounter.    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 Lab Results  Component Value Date   WBC 3.5 (L) 08/03/2024   HGB 11.0 (L) 08/03/2024   HCT 32.7 (L) 08/03/2024   MCV 85.4 08/03/2024   PLT 75 (L) 08/03/2024   SUMMARY OF HEMATOLOGIC HISTORY:  She was evaluated by another hematologist in 2020 for pancytopenia I saw her in September 2025 and ordered extensive workup Myeloma panel, autoimmune screen, infectious disease workup were all negative.  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  Prednisone  is reduced to 30 mg daily

## 2024-08-03 NOTE — Assessment & Plan Note (Addendum)
 She was evaluated by another hematologist in 2020 for pancytopenia I saw her in September 2025 and ordered extensive workup Myeloma panel, autoimmune screen, infectious disease workup were all negative.  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; as ITP is a diagnosis of exclusion, this fits the picture Her anemia is likely related to chronic illness related to her diabetes 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 She started prednisone  at 60 mg on July 03, 2024 I recommend prednisone  taper to 40 mg starting November 19 Unfortunately, with recent steroid taper, her platelet count start dropping again At this point, 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

## 2024-08-05 ENCOUNTER — Other Ambulatory Visit: Payer: Self-pay

## 2024-08-08 NOTE — Progress Notes (Signed)
 Rapid Infusion Rituximab  Pharmacist Evaluation  [name] is a 58 y.o. female being treated with rituximab  for ITP. This patient may be considered for RIR.   A pharmacist has verified the patient tolerated rituximab  infusions per the [facility] standard infusion protocol without grade 3-4 infusion reactions. The treatment plan will be updated to reflect RIR if the patient qualifies per the checklist below:   Age > 39 years old Yes   Clinically significant cardiovascular disease No   Circulating lymphocyte count < 5000/uL prior to cycle two Yes  Lab Results  Component Value Date   LYMPHSABS 1.5 08/03/2024    Prior documented grade 3-4 infusion reaction to rituximab  No   Prior documented grade 1-2 infusion reaction to rituximab  (If YES, Pharmacist will confirm with Physician if patient is still a candidate for RIR) No   Previous rituximab  infusion within the past 6 months Yes   Treatment Plan updated orders to reflect RIR Yes    [name] does meet the criteria for Rapid Infusion Rituximab . This patient is going to be switched to rapid infusion rituximab .   [name], PharmD, MBA

## 2024-08-10 ENCOUNTER — Inpatient Hospital Stay

## 2024-08-10 ENCOUNTER — Encounter: Payer: Self-pay | Admitting: Hematology and Oncology

## 2024-08-10 ENCOUNTER — Inpatient Hospital Stay: Admitting: Hematology and Oncology

## 2024-08-10 VITALS — BP 135/80 | HR 64 | Temp 98.4°F | Resp 16 | Wt 176.5 lb

## 2024-08-10 DIAGNOSIS — D61818 Other pancytopenia: Secondary | ICD-10-CM

## 2024-08-10 DIAGNOSIS — D693 Immune thrombocytopenic purpura: Secondary | ICD-10-CM

## 2024-08-10 LAB — CBC WITH DIFFERENTIAL/PLATELET
Abs Immature Granulocytes: 0 K/uL (ref 0.00–0.07)
Basophils Absolute: 0 K/uL (ref 0.0–0.1)
Basophils Relative: 0 %
Eosinophils Absolute: 0.1 K/uL (ref 0.0–0.5)
Eosinophils Relative: 3 %
HCT: 30.6 % — ABNORMAL LOW (ref 36.0–46.0)
Hemoglobin: 10.6 g/dL — ABNORMAL LOW (ref 12.0–15.0)
Immature Granulocytes: 0 %
Lymphocytes Relative: 49 %
Lymphs Abs: 1.3 K/uL (ref 0.7–4.0)
MCH: 29 pg (ref 26.0–34.0)
MCHC: 34.6 g/dL (ref 30.0–36.0)
MCV: 83.6 fL (ref 80.0–100.0)
Monocytes Absolute: 0.7 K/uL (ref 0.1–1.0)
Monocytes Relative: 25 %
Neutro Abs: 0.6 K/uL — ABNORMAL LOW (ref 1.7–7.7)
Neutrophils Relative %: 23 %
Platelets: 104 K/uL — ABNORMAL LOW (ref 150–400)
RBC: 3.66 MIL/uL — ABNORMAL LOW (ref 3.87–5.11)
RDW: 14.4 % (ref 11.5–15.5)
WBC: 2.6 K/uL — ABNORMAL LOW (ref 4.0–10.5)
nRBC: 0 % (ref 0.0–0.2)

## 2024-08-10 MED ORDER — SODIUM CHLORIDE 0.9 % IV SOLN
375.0000 mg/m2 | Freq: Once | INTRAVENOUS | Status: AC
  Administered 2024-08-10: 700 mg via INTRAVENOUS
  Filled 2024-08-10: qty 50

## 2024-08-10 MED ORDER — DIPHENHYDRAMINE HCL 25 MG PO CAPS
50.0000 mg | ORAL_CAPSULE | Freq: Once | ORAL | Status: AC
  Administered 2024-08-10: 50 mg via ORAL
  Filled 2024-08-10: qty 2

## 2024-08-10 MED ORDER — ACETAMINOPHEN 325 MG PO TABS
650.0000 mg | ORAL_TABLET | Freq: Once | ORAL | Status: AC
  Administered 2024-08-10: 650 mg via ORAL
  Filled 2024-08-10: qty 2

## 2024-08-10 MED ORDER — SODIUM CHLORIDE 0.9 % IV SOLN: INTRAVENOUS | Status: DC

## 2024-08-10 MED ORDER — PREDNISONE 20 MG PO TABS: 20.0000 mg | ORAL_TABLET | Freq: Every day | ORAL | Status: DC

## 2024-08-10 NOTE — Patient Instructions (Signed)
 [facility] - A DEPT OF [facility]  Discharge Instructions: Thank you for choosing [facility] to provide your oncology and hematology care.   If you have a lab appointment with the Cancer Center, please go directly to the Cancer Center and check in at the registration area.   Wear comfortable clothing and clothing appropriate for easy access to any Portacath or PICC line.   We strive to give you quality time with your provider. You may need to reschedule your appointment if you arrive late (15 or more minutes).  Arriving late affects you and other patients whose appointments are after yours.  Also, if you miss three or more appointments without notifying the office, you may be dismissed from the clinic at the provider's discretion.      For prescription refill requests, have your pharmacy contact our office and allow 72 hours for refills to be completed.    Today you received the following chemotherapy and/or immunotherapy agents rituxan      To help prevent nausea and vomiting after your treatment, we encourage you to take your nausea medication as directed.  BELOW ARE SYMPTOMS THAT SHOULD BE REPORTED IMMEDIATELY: *FEVER GREATER THAN 100.4 F (38 C) OR HIGHER *CHILLS OR SWEATING *NAUSEA AND VOMITING THAT IS NOT CONTROLLED WITH YOUR NAUSEA MEDICATION *UNUSUAL SHORTNESS OF BREATH *UNUSUAL BRUISING OR BLEEDING *URINARY PROBLEMS (pain or burning when urinating, or frequent urination) *BOWEL PROBLEMS (unusual diarrhea, constipation, pain near the anus) TENDERNESS IN MOUTH AND THROAT WITH OR WITHOUT PRESENCE OF ULCERS (sore throat, sores in mouth, or a toothache) UNUSUAL RASH, SWELLING OR PAIN  UNUSUAL VAGINAL DISCHARGE OR ITCHING   Items with * indicate a potential emergency and should be followed up as soon as possible or go to the Emergency Department if any problems should occur.  Please show the CHEMOTHERAPY ALERT CARD or IMMUNOTHERAPY  ALERT CARD at check-in to the Emergency Department and triage nurse.  Should you have questions after your visit or need to cancel or reschedule your appointment, please contact [facility] - A DEPT [facility]  Dept: [phone]  and follow the prompts.  Office hours are 8:00 a.m. to 4:30 p.m. Monday - Friday. Please note that voicemails left after 4:00 p.m. may not be returned until the following business day.  We are closed weekends and major holidays. You have access to a nurse at all times for urgent questions. Please call the main number to the clinic Dept: [phone] and follow the prompts.   For any non-urgent questions, you may also contact your provider using MyChart. We now offer e-Visits for anyone 2 and older to request care online for non-urgent symptoms. For details visit [email or web address].   Also download the MyChart app! Go to the app store, search "MyChart", open the app, select [place], and log in with your MyChart username and password.

## 2024-08-10 NOTE — Assessment & Plan Note (Addendum)
 She was evaluated by another hematologist in 2020 for pancytopenia I saw her in September 2025 and ordered extensive workup Myeloma panel, autoimmune screen, infectious disease workup were all negative.  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; as ITP is a diagnosis of exclusion, this fits the picture Her anemia is likely related to chronic illness related to her diabetes 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 She started prednisone  at 60 mg on July 03, 2024 I recommend prednisone  taper to 40 mg starting November 19 Unfortunately, with recent steroid taper, her platelet count start dropping again At this point, 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

## 2024-08-10 NOTE — Progress Notes (Signed)
 [facility] OFFICE PROGRESS NOTE  Patient Care Team: Vanessa Bare, MD as PCP - General (Internal Medicine) Vanessa Nancyann SQUIBB, MD (Thoracic Surgery) Vanessa Ade, MD (Inactive) (Cardiology)  Assessment & Plan Other pancytopenia [name]) She was evaluated by another hematologist in 2020 for pancytopenia I saw her in September 2025 and ordered extensive workup Myeloma panel, autoimmune screen, infectious disease workup were all negative.  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; as ITP is a diagnosis of exclusion, this fits the picture Her anemia is likely related to chronic illness related to her diabetes 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 She started prednisone  at 60 mg on July 03, 2024 I recommend prednisone  taper to 40 mg starting November 19 Unfortunately, with recent steroid taper, her platelet count start dropping again At this point, 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  No orders of the defined types were placed in this encounter.    Vanessa Bedford, MD  INTERVAL HISTORY: she returns for surveillance  follow-up for recurrent ITP She tolerated rituximab  well without side effects Patient denies recent bleeding such as epistaxis, hematuria or hematochezia We reviewed medication list and discussed medication changes We discussed test results and future plan of care as outlined above  PHYSICAL EXAMINATION: ECOG PERFORMANCE STATUS: 0 - Asymptomatic  There were no vitals filed for this visit. Lab Results  Component Value Date   WBC 2.6 (L) 08/10/2024   HGB 10.6 (L) 08/10/2024   HCT 30.6 (L) 08/10/2024   MCV 83.6 08/10/2024   PLT 104 (L) 08/10/2024   SUMMARY OF HEMATOLOGIC HISTORY:  She was evaluated by another hematologist in 2020 for pancytopenia I saw her in September 2025 and ordered extensive workup Myeloma panel, autoimmune screen, infectious disease workup were all negative.  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  Prednisone  is reduced to 30 mg daily On August 10, 2024, prednisone  is reduced to 20 mg daily

## 2024-08-11 ENCOUNTER — Other Ambulatory Visit: Payer: Self-pay

## 2024-08-27 ENCOUNTER — Inpatient Hospital Stay

## 2024-08-27 ENCOUNTER — Encounter: Payer: Self-pay | Admitting: Hematology and Oncology

## 2024-08-27 ENCOUNTER — Inpatient Hospital Stay: Attending: Hematology and Oncology | Admitting: Hematology and Oncology

## 2024-08-27 VITALS — BP 128/64 | HR 65 | Temp 97.5°F | Resp 12

## 2024-08-27 VITALS — BP 132/71 | HR 66 | Temp 97.6°F | Resp 18 | Ht 64.0 in | Wt 177.8 lb

## 2024-08-27 DIAGNOSIS — D693 Immune thrombocytopenic purpura: Secondary | ICD-10-CM | POA: Insufficient documentation

## 2024-08-27 DIAGNOSIS — Z7952 Long term (current) use of systemic steroids: Secondary | ICD-10-CM | POA: Insufficient documentation

## 2024-08-27 DIAGNOSIS — D61818 Other pancytopenia: Secondary | ICD-10-CM | POA: Diagnosis not present

## 2024-08-27 DIAGNOSIS — Z79899 Other long term (current) drug therapy: Secondary | ICD-10-CM | POA: Diagnosis not present

## 2024-08-27 DIAGNOSIS — D72819 Decreased white blood cell count, unspecified: Secondary | ICD-10-CM

## 2024-08-27 DIAGNOSIS — E119 Type 2 diabetes mellitus without complications: Secondary | ICD-10-CM | POA: Diagnosis not present

## 2024-08-27 LAB — CBC WITH DIFFERENTIAL/PLATELET
Abs Immature Granulocytes: 0.01 K/uL (ref 0.00–0.07)
Basophils Absolute: 0 K/uL (ref 0.0–0.1)
Basophils Relative: 1 %
Eosinophils Absolute: 0 K/uL (ref 0.0–0.5)
Eosinophils Relative: 1 %
HCT: 31.7 % — ABNORMAL LOW (ref 36.0–46.0)
Hemoglobin: 11.1 g/dL — ABNORMAL LOW (ref 12.0–15.0)
Immature Granulocytes: 0 %
Lymphocytes Relative: 56 %
Lymphs Abs: 1.5 K/uL (ref 0.7–4.0)
MCH: 29 pg (ref 26.0–34.0)
MCHC: 35 g/dL (ref 30.0–36.0)
MCV: 82.8 fL (ref 80.0–100.0)
Monocytes Absolute: 0.3 K/uL (ref 0.1–1.0)
Monocytes Relative: 11 %
Neutro Abs: 0.8 K/uL — ABNORMAL LOW (ref 1.7–7.7)
Neutrophils Relative %: 31 %
Platelets: 104 K/uL — ABNORMAL LOW (ref 150–400)
RBC: 3.83 MIL/uL — ABNORMAL LOW (ref 3.87–5.11)
RDW: 14.3 % (ref 11.5–15.5)
WBC: 2.7 K/uL — ABNORMAL LOW (ref 4.0–10.5)
nRBC: 0 % (ref 0.0–0.2)

## 2024-08-27 MED ORDER — SODIUM CHLORIDE 0.9 % IV SOLN: INTRAVENOUS | Status: DC

## 2024-08-27 MED ORDER — ACETAMINOPHEN 325 MG PO TABS
650.0000 mg | ORAL_TABLET | Freq: Once | ORAL | Status: AC
  Administered 2024-08-27: 650 mg via ORAL
  Filled 2024-08-27: qty 2

## 2024-08-27 MED ORDER — PREDNISONE 10 MG PO TABS: 10.0000 mg | ORAL_TABLET | Freq: Every day | ORAL | Status: DC

## 2024-08-27 MED ORDER — DIPHENHYDRAMINE HCL 25 MG PO CAPS
50.0000 mg | ORAL_CAPSULE | Freq: Once | ORAL | Status: AC
  Administered 2024-08-27: 50 mg via ORAL
  Filled 2024-08-27: qty 2

## 2024-08-27 MED ORDER — SODIUM CHLORIDE 0.9 % IV SOLN
375.0000 mg/m2 | Freq: Once | INTRAVENOUS | Status: AC
  Administered 2024-08-27: 700 mg via INTRAVENOUS
  Filled 2024-08-27: qty 50

## 2024-08-27 NOTE — Assessment & Plan Note (Addendum)
 She was evaluated by another hematologist in 2020 for pancytopenia I saw her in September 2025 and ordered extensive workup Myeloma panel, autoimmune screen, infectious disease workup were all negative.  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; as ITP is a diagnosis of exclusion, this fits the picture Her anemia is likely related to chronic illness related to her diabetes 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 She started prednisone  at 60 mg on July 03, 2024 I recommend prednisone  taper to 40 mg starting November 19 Unfortunately, with recent steroid taper, her platelet count start dropping again At this point, she is considered steroid refractory Ultimately, she decided to start treatment with rituximab  started on 12/12 We discussed weekly rituximab  dose and side effects to be expected Repeat CBC show mild worsening leukopenia likely due to treatment but she is not symptomatic Platelet count has improved I recommend prednisone  taper to 10 mg daily

## 2024-08-27 NOTE — Assessment & Plan Note (Addendum)
 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

## 2024-08-27 NOTE — Progress Notes (Signed)
 [facility] OFFICE PROGRESS NOTE  Patient Care Team: Catalina Bare, MD as PCP - General (Internal Medicine) Brantley Nancyann SQUIBB, MD (Thoracic Surgery) Maye Ade, MD (Inactive) (Cardiology)  Assessment & Plan Other pancytopenia [facility]) She was evaluated by another hematologist in 2020 for pancytopenia I saw her in September 2025 and ordered extensive workup Myeloma panel, autoimmune screen, infectious disease workup were all negative.  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; as ITP is a diagnosis of exclusion, this fits the picture Her anemia is likely related to chronic illness related to her diabetes 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 She started prednisone  at 60 mg on July 03, 2024 I recommend prednisone  taper to 40 mg starting November 19 Unfortunately, with recent steroid taper, her platelet count start dropping again At this point, 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    Almarie Bedford, MD  INTERVAL HISTORY: she returns for  surveillance follow-up for recurrent ITP Patient denies recent bleeding such as epistaxis, hematuria or hematochezia We reviewed medication list and discussed medication changes We discussed test results and future plan of care as outlined above  PHYSICAL EXAMINATION: ECOG PERFORMANCE STATUS: 0 - Asymptomatic  Vitals:   08/27/24 1247  BP: 132/71  Pulse: 66  Resp: 18  Temp: 97.6 F (36.4 C)  SpO2: 98%   Lab Results  Component Value Date   WBC 2.7 (L) 08/27/2024   HGB 11.1 (L) 08/27/2024   HCT 31.7 (L) 08/27/2024   MCV 82.8 08/27/2024   PLT 104 (L) 08/27/2024   SUMMARY OF HEMATOLOGIC HISTORY:  She was evaluated by another hematologist in 2020 for pancytopenia I saw her in September 2025 and ordered extensive workup Myeloma panel, autoimmune screen, infectious disease workup were all negative.  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  Prednisone  is reduced to 30 mg daily On August 10, 2024, prednisone  is reduced to 20 mg daily On August 28, 2023, prednisone  was reduced to 10 mg daily

## 2024-08-27 NOTE — Patient Instructions (Signed)
 [facility] - A DEPT OF [facility]  Discharge Instructions: Thank you for choosing [facility] to provide your oncology and hematology care.   If you have a lab appointment with the Cancer Center, please go directly to the Cancer Center and check in at the registration area.   Wear comfortable clothing and clothing appropriate for easy access to any Portacath or PICC line.   We strive to give you quality time with your provider. You may need to reschedule your appointment if you arrive late (15 or more minutes).  Arriving late affects you and other patients whose appointments are after yours.  Also, if you miss three or more appointments without notifying the office, you may be dismissed from the clinic at the provider's discretion.      For prescription refill requests, have your pharmacy contact our office and allow 72 hours for refills to be completed.    Today you received the following chemotherapy and/or immunotherapy agents :  Rapid Rituxan       To help prevent nausea and vomiting after your treatment, we encourage you to take your nausea medication as directed.  BELOW ARE SYMPTOMS THAT SHOULD BE REPORTED IMMEDIATELY: *FEVER GREATER THAN 100.4 F (38 C) OR HIGHER *CHILLS OR SWEATING *NAUSEA AND VOMITING THAT IS NOT CONTROLLED WITH YOUR NAUSEA MEDICATION *UNUSUAL SHORTNESS OF BREATH *UNUSUAL BRUISING OR BLEEDING *URINARY PROBLEMS (pain or burning when urinating, or frequent urination) *BOWEL PROBLEMS (unusual diarrhea, constipation, pain near the anus) TENDERNESS IN MOUTH AND THROAT WITH OR WITHOUT PRESENCE OF ULCERS (sore throat, sores in mouth, or a toothache) UNUSUAL RASH, SWELLING OR PAIN  UNUSUAL VAGINAL DISCHARGE OR ITCHING   Items with * indicate a potential emergency and should be followed up as soon as possible or go to the Emergency Department if any problems should occur.  Please show the CHEMOTHERAPY ALERT CARD or  IMMUNOTHERAPY ALERT CARD at check-in to the Emergency Department and triage nurse.  Should you have questions after your visit or need to cancel or reschedule your appointment, please contact [facility] - A DEPT OF [facility]  Dept: [phone]  and follow the prompts.  Office hours are 8:00 a.m. to 4:30 p.m. Monday - Friday. Please note that voicemails left after 4:00 p.m. may not be returned until the following business day.  We are closed weekends and major holidays. You have access to a nurse at all times for urgent questions. Please call the main number to the clinic Dept: [phone] and follow the prompts.   For any non-urgent questions, you may also contact your provider using MyChart. We now offer e-Visits for anyone 48 and older to request care online for non-urgent symptoms. For details visit [email or web address].   Also download the MyChart app! Go to the app store, search MyChart, open the app, select [place], and log in with your MyChart username and password.

## 2024-08-28 ENCOUNTER — Other Ambulatory Visit: Payer: Self-pay

## 2024-09-03 ENCOUNTER — Inpatient Hospital Stay

## 2024-09-03 ENCOUNTER — Inpatient Hospital Stay: Admitting: Hematology and Oncology

## 2024-09-03 VITALS — BP 140/68 | HR 70 | Temp 98.3°F | Resp 20 | Wt 171.5 lb

## 2024-09-03 DIAGNOSIS — D61818 Other pancytopenia: Secondary | ICD-10-CM

## 2024-09-03 DIAGNOSIS — D693 Immune thrombocytopenic purpura: Secondary | ICD-10-CM | POA: Diagnosis not present

## 2024-09-03 LAB — CBC WITH DIFFERENTIAL/PLATELET
Abs Immature Granulocytes: 0.01 K/uL (ref 0.00–0.07)
Basophils Absolute: 0 K/uL (ref 0.0–0.1)
Basophils Relative: 1 %
Eosinophils Absolute: 0 K/uL (ref 0.0–0.5)
Eosinophils Relative: 1 %
HCT: 32.6 % — ABNORMAL LOW (ref 36.0–46.0)
Hemoglobin: 11.3 g/dL — ABNORMAL LOW (ref 12.0–15.0)
Immature Granulocytes: 0 %
Lymphocytes Relative: 33 %
Lymphs Abs: 1.4 K/uL (ref 0.7–4.0)
MCH: 28.8 pg (ref 26.0–34.0)
MCHC: 34.7 g/dL (ref 30.0–36.0)
MCV: 83.2 fL (ref 80.0–100.0)
Monocytes Absolute: 0.4 K/uL (ref 0.1–1.0)
Monocytes Relative: 9 %
Neutro Abs: 2.4 K/uL (ref 1.7–7.7)
Neutrophils Relative %: 56 %
Platelets: 81 K/uL — ABNORMAL LOW (ref 150–400)
RBC: 3.92 MIL/uL (ref 3.87–5.11)
RDW: 14.4 % (ref 11.5–15.5)
WBC: 4.3 K/uL (ref 4.0–10.5)
nRBC: 0 % (ref 0.0–0.2)

## 2024-09-03 MED ORDER — SODIUM CHLORIDE 0.9 % IV SOLN: INTRAVENOUS | Status: DC

## 2024-09-03 MED ORDER — DIPHENHYDRAMINE HCL 25 MG PO CAPS
50.0000 mg | ORAL_CAPSULE | Freq: Once | ORAL | Status: AC
  Administered 2024-09-03: 50 mg via ORAL
  Filled 2024-09-03: qty 2

## 2024-09-03 MED ORDER — PREDNISONE 10 MG PO TABS: 10.0000 mg | ORAL_TABLET | Freq: Every day | ORAL | 1 refills | Status: AC

## 2024-09-03 MED ORDER — SODIUM CHLORIDE 0.9 % IV SOLN
375.0000 mg/m2 | Freq: Once | INTRAVENOUS | Status: AC
  Administered 2024-09-03: 700 mg via INTRAVENOUS
  Filled 2024-09-03: qty 20

## 2024-09-03 MED ORDER — ACETAMINOPHEN 325 MG PO TABS
650.0000 mg | ORAL_TABLET | Freq: Once | ORAL | Status: AC
  Administered 2024-09-03: 650 mg via ORAL
  Filled 2024-09-03: qty 2

## 2024-09-03 MED ORDER — SODIUM CHLORIDE 0.9% FLUSH: 10.0000 mL | INTRAVENOUS | Status: DC | PRN

## 2024-09-03 NOTE — Progress Notes (Signed)
 [facility] OFFICE PROGRESS NOTE  Patient Care Team: Catalina Bare, MD as PCP - General (Internal Medicine) Brantley Nancyann SQUIBB, MD (Thoracic Surgery) Maye Ade, MD (Inactive) (Cardiology)  Assessment & Plan Other pancytopenia [facility]) She was evaluated by another hematologist in 2020 for pancytopenia I saw her in September 2025 and ordered extensive workup Myeloma panel, autoimmune screen, infectious disease workup were all negative.  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; as ITP is a diagnosis of exclusion, this fits the picture Her anemia is likely related to chronic illness related to her diabetes 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 She started prednisone  at 60 mg on July 03, 2024 I recommend prednisone  taper to 40 mg starting November 19 Unfortunately, with recent steroid taper, her platelet count start dropping again At this point, 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  Orders Placed This Encounter  Procedures   Comprehensive metabolic panel with GFR    Standing Status:   Standing    Number of Occurrences:   33    Expiration Date:    09/03/2025     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 Lab Results  Component Value Date   WBC 4.3 09/03/2024   HGB 11.3 (L) 09/03/2024   HCT 32.6 (L) 09/03/2024   MCV 83.2 09/03/2024   PLT 81 (L) 09/03/2024   SUMMARY OF HEMATOLOGIC HISTORY:  She was evaluated by another hematologist in 2020 for pancytopenia I saw her in September 2025 and ordered extensive workup Myeloma panel, autoimmune screen, infectious disease workup were all negative.  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  Prednisone  is reduced to 30 mg daily On August 10, 2024, prednisone  is reduced to 20 mg daily On August 28, 2023, prednisone  was reduced to 10 mg daily September 03, 2024,  last dose rituximab .  Prednisone  at 10 mg daily

## 2024-09-03 NOTE — Assessment & Plan Note (Addendum)
 She was evaluated by another hematologist in 2020 for pancytopenia I saw her in September 2025 and ordered extensive workup Myeloma panel, autoimmune screen, infectious disease workup were all negative.  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; as ITP is a diagnosis of exclusion, this fits the picture Her anemia is likely related to chronic illness related to her diabetes 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 She started prednisone  at 60 mg on July 03, 2024 I recommend prednisone  taper to 40 mg starting November 19 Unfortunately, with recent steroid taper, her platelet count start dropping again At this point, 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

## 2024-09-03 NOTE — Patient Instructions (Signed)
 [facility] - A DEPT OF [facility]  Discharge Instructions: Thank you for choosing [facility] to provide your oncology and hematology care.   If you have a lab appointment with the Cancer Center, please go directly to the Cancer Center and check in at the registration area.   Wear comfortable clothing and clothing appropriate for easy access to any Portacath or PICC line.   We strive to give you quality time with your provider. You may need to reschedule your appointment if you arrive late (15 or more minutes).  Arriving late affects you and other patients whose appointments are after yours.  Also, if you miss three or more appointments without notifying the office, you may be dismissed from the clinic at the provider's discretion.      For prescription refill requests, have your pharmacy contact our office and allow 72 hours for refills to be completed.    Today you received the following chemotherapy and/or immunotherapy agent: Rituximab      To help prevent nausea and vomiting after your treatment, we encourage you to take your nausea medication as directed.  BELOW ARE SYMPTOMS THAT SHOULD BE REPORTED IMMEDIATELY: *FEVER GREATER THAN 100.4 F (38 C) OR HIGHER *CHILLS OR SWEATING *NAUSEA AND VOMITING THAT IS NOT CONTROLLED WITH YOUR NAUSEA MEDICATION *UNUSUAL SHORTNESS OF BREATH *UNUSUAL BRUISING OR BLEEDING *URINARY PROBLEMS (pain or burning when urinating, or frequent urination) *BOWEL PROBLEMS (unusual diarrhea, constipation, pain near the anus) TENDERNESS IN MOUTH AND THROAT WITH OR WITHOUT PRESENCE OF ULCERS (sore throat, sores in mouth, or a toothache) UNUSUAL RASH, SWELLING OR PAIN  UNUSUAL VAGINAL DISCHARGE OR ITCHING   Items with * indicate a potential emergency and should be followed up as soon as possible or go to the Emergency Department if any problems should occur.  Please show the CHEMOTHERAPY ALERT CARD or IMMUNOTHERAPY  ALERT CARD at check-in to the Emergency Department and triage nurse.  Should you have questions after your visit or need to cancel or reschedule your appointment, please contact [facility] - A DEPT OF [facility]  Dept: [phone]  and follow the prompts.  Office hours are 8:00 a.m. to 4:30 p.m. Monday - Friday. Please note that voicemails left after 4:00 p.m. may not be returned until the following business day.  We are closed weekends and major holidays. You have access to a nurse at all times for urgent questions. Please call the main number to the clinic Dept: [phone] and follow the prompts.   For any non-urgent questions, you may also contact your provider using MyChart. We now offer e-Visits for anyone 31 and older to request care online for non-urgent symptoms. For details visit [email or web address].   Also download the MyChart app! Go to the app store, search "MyChart", open the app, select [place], and log in with your MyChart username and password.

## 2024-09-04 ENCOUNTER — Other Ambulatory Visit: Payer: Self-pay

## 2024-09-24 ENCOUNTER — Inpatient Hospital Stay

## 2024-09-24 ENCOUNTER — Inpatient Hospital Stay: Admitting: Hematology and Oncology

## 2024-09-25 ENCOUNTER — Other Ambulatory Visit: Payer: Self-pay

## 2024-10-09 ENCOUNTER — Inpatient Hospital Stay: Attending: Hematology and Oncology

## 2024-10-09 ENCOUNTER — Inpatient Hospital Stay: Admitting: Hematology and Oncology
